# Patient Record
Sex: Female | Born: 1960
Health system: Southern US, Community
[De-identification: ages and names within clinical notes are randomized; demographics above are authoritative.]

## PROBLEM LIST (undated history)

## (undated) DIAGNOSIS — I7090 Unspecified atherosclerosis: Secondary | ICD-10-CM

## (undated) DIAGNOSIS — I10 Essential (primary) hypertension: Secondary | ICD-10-CM

## (undated) DIAGNOSIS — G4733 Obstructive sleep apnea (adult) (pediatric): Secondary | ICD-10-CM

## (undated) DIAGNOSIS — R5383 Other fatigue: Secondary | ICD-10-CM

## (undated) DIAGNOSIS — E7841 Elevated Lipoprotein(a): Secondary | ICD-10-CM

## (undated) DIAGNOSIS — I2510 Atherosclerotic heart disease of native coronary artery without angina pectoris: Secondary | ICD-10-CM

## (undated) DIAGNOSIS — R011 Cardiac murmur, unspecified: Secondary | ICD-10-CM

## (undated) DIAGNOSIS — I340 Nonrheumatic mitral (valve) insufficiency: Secondary | ICD-10-CM

## (undated) DIAGNOSIS — F319 Bipolar disorder, unspecified: Secondary | ICD-10-CM

## (undated) DIAGNOSIS — R002 Palpitations: Secondary | ICD-10-CM

## (undated) DIAGNOSIS — E785 Hyperlipidemia, unspecified: Secondary | ICD-10-CM

## (undated) DIAGNOSIS — R079 Chest pain, unspecified: Secondary | ICD-10-CM

## (undated) DIAGNOSIS — J449 Chronic obstructive pulmonary disease, unspecified: Secondary | ICD-10-CM

## (undated) DIAGNOSIS — R609 Edema, unspecified: Secondary | ICD-10-CM

## (undated) HISTORY — DX: Nonrheumatic mitral (valve) insufficiency: I34.0

## (undated) HISTORY — DX: Other fatigue: R53.83

## (undated) HISTORY — DX: Atherosclerotic heart disease of native coronary artery without angina pectoris: I25.10

## (undated) HISTORY — DX: Obstructive sleep apnea (adult) (pediatric): G47.33

## (undated) HISTORY — DX: Edema, unspecified: R60.9

## (undated) HISTORY — DX: Chest pain, unspecified: R07.9

## (undated) HISTORY — DX: Palpitations: R00.2

## (undated) HISTORY — DX: Unspecified atherosclerosis: I70.90

## (undated) HISTORY — DX: Bipolar disorder, unspecified: F31.9

## (undated) HISTORY — DX: Hyperlipidemia, unspecified: E78.5

## (undated) HISTORY — DX: Elevated lipoprotein(a): E78.41

## (undated) HISTORY — PX: TONSILLECTOMY: SUR1361

## (undated) HISTORY — DX: Chronic obstructive pulmonary disease, unspecified: J44.9

## (undated) HISTORY — DX: Cardiac murmur, unspecified: R01.1

## (undated) HISTORY — PX: TUBAL LIGATION: SHX77

## (undated) HISTORY — DX: Essential (primary) hypertension: I10

## (undated) HISTORY — PX: CHOLECYSTECTOMY: SHX55

---

## 2014-02-02 DIAGNOSIS — R0789 Other chest pain: Secondary | ICD-10-CM | POA: Diagnosis not present

## 2014-02-02 DIAGNOSIS — S299XXA Unspecified injury of thorax, initial encounter: Secondary | ICD-10-CM | POA: Diagnosis not present

## 2014-02-02 DIAGNOSIS — R0781 Pleurodynia: Secondary | ICD-10-CM | POA: Diagnosis not present

## 2014-02-02 DIAGNOSIS — J449 Chronic obstructive pulmonary disease, unspecified: Secondary | ICD-10-CM | POA: Diagnosis not present

## 2014-02-02 DIAGNOSIS — W010XXA Fall on same level from slipping, tripping and stumbling without subsequent striking against object, initial encounter: Secondary | ICD-10-CM | POA: Diagnosis not present

## 2014-02-02 DIAGNOSIS — F17210 Nicotine dependence, cigarettes, uncomplicated: Secondary | ICD-10-CM | POA: Diagnosis not present

## 2014-02-06 DIAGNOSIS — F329 Major depressive disorder, single episode, unspecified: Secondary | ICD-10-CM | POA: Diagnosis not present

## 2014-02-06 DIAGNOSIS — E785 Hyperlipidemia, unspecified: Secondary | ICD-10-CM | POA: Diagnosis not present

## 2014-02-06 DIAGNOSIS — J449 Chronic obstructive pulmonary disease, unspecified: Secondary | ICD-10-CM | POA: Diagnosis not present

## 2014-02-06 DIAGNOSIS — Z720 Tobacco use: Secondary | ICD-10-CM | POA: Diagnosis not present

## 2014-06-08 DIAGNOSIS — E875 Hyperkalemia: Secondary | ICD-10-CM | POA: Diagnosis not present

## 2014-06-08 DIAGNOSIS — E785 Hyperlipidemia, unspecified: Secondary | ICD-10-CM | POA: Diagnosis not present

## 2014-06-20 DIAGNOSIS — E785 Hyperlipidemia, unspecified: Secondary | ICD-10-CM | POA: Diagnosis not present

## 2014-06-20 DIAGNOSIS — F329 Major depressive disorder, single episode, unspecified: Secondary | ICD-10-CM | POA: Diagnosis not present

## 2014-06-20 DIAGNOSIS — R509 Fever, unspecified: Secondary | ICD-10-CM | POA: Diagnosis not present

## 2014-06-20 DIAGNOSIS — R05 Cough: Secondary | ICD-10-CM | POA: Diagnosis not present

## 2014-08-12 DIAGNOSIS — M79601 Pain in right arm: Secondary | ICD-10-CM | POA: Diagnosis not present

## 2014-08-17 DIAGNOSIS — J449 Chronic obstructive pulmonary disease, unspecified: Secondary | ICD-10-CM | POA: Diagnosis not present

## 2014-08-17 DIAGNOSIS — L03032 Cellulitis of left toe: Secondary | ICD-10-CM | POA: Diagnosis not present

## 2014-08-17 DIAGNOSIS — M79675 Pain in left toe(s): Secondary | ICD-10-CM | POA: Diagnosis not present

## 2014-08-17 DIAGNOSIS — S99922A Unspecified injury of left foot, initial encounter: Secondary | ICD-10-CM | POA: Diagnosis not present

## 2014-08-17 DIAGNOSIS — Z79899 Other long term (current) drug therapy: Secondary | ICD-10-CM | POA: Diagnosis not present

## 2014-08-17 DIAGNOSIS — L089 Local infection of the skin and subcutaneous tissue, unspecified: Secondary | ICD-10-CM | POA: Diagnosis not present

## 2014-08-17 DIAGNOSIS — S91202A Unspecified open wound of left great toe with damage to nail, initial encounter: Secondary | ICD-10-CM | POA: Diagnosis not present

## 2014-08-17 DIAGNOSIS — F17210 Nicotine dependence, cigarettes, uncomplicated: Secondary | ICD-10-CM | POA: Diagnosis not present

## 2014-08-26 DIAGNOSIS — K1379 Other lesions of oral mucosa: Secondary | ICD-10-CM | POA: Diagnosis not present

## 2014-08-26 DIAGNOSIS — K088 Other specified disorders of teeth and supporting structures: Secondary | ICD-10-CM | POA: Diagnosis not present

## 2014-10-16 DIAGNOSIS — R05 Cough: Secondary | ICD-10-CM | POA: Diagnosis not present

## 2014-10-16 DIAGNOSIS — R079 Chest pain, unspecified: Secondary | ICD-10-CM | POA: Diagnosis not present

## 2014-10-16 DIAGNOSIS — J189 Pneumonia, unspecified organism: Secondary | ICD-10-CM | POA: Diagnosis not present

## 2014-10-16 DIAGNOSIS — J449 Chronic obstructive pulmonary disease, unspecified: Secondary | ICD-10-CM | POA: Diagnosis not present

## 2014-10-16 DIAGNOSIS — Z79899 Other long term (current) drug therapy: Secondary | ICD-10-CM | POA: Diagnosis not present

## 2014-10-16 DIAGNOSIS — F17210 Nicotine dependence, cigarettes, uncomplicated: Secondary | ICD-10-CM | POA: Diagnosis not present

## 2014-10-16 DIAGNOSIS — R0602 Shortness of breath: Secondary | ICD-10-CM | POA: Diagnosis not present

## 2014-10-17 DIAGNOSIS — R634 Abnormal weight loss: Secondary | ICD-10-CM | POA: Diagnosis not present

## 2014-10-17 DIAGNOSIS — J189 Pneumonia, unspecified organism: Secondary | ICD-10-CM | POA: Diagnosis not present

## 2014-10-17 DIAGNOSIS — G4700 Insomnia, unspecified: Secondary | ICD-10-CM | POA: Diagnosis not present

## 2014-10-17 DIAGNOSIS — J449 Chronic obstructive pulmonary disease, unspecified: Secondary | ICD-10-CM | POA: Diagnosis not present

## 2014-11-21 DIAGNOSIS — Z0389 Encounter for observation for other suspected diseases and conditions ruled out: Secondary | ICD-10-CM | POA: Diagnosis not present

## 2014-11-21 DIAGNOSIS — J189 Pneumonia, unspecified organism: Secondary | ICD-10-CM | POA: Diagnosis not present

## 2015-01-17 DIAGNOSIS — E875 Hyperkalemia: Secondary | ICD-10-CM | POA: Diagnosis not present

## 2015-01-17 DIAGNOSIS — E785 Hyperlipidemia, unspecified: Secondary | ICD-10-CM | POA: Diagnosis not present

## 2015-01-29 DIAGNOSIS — Z720 Tobacco use: Secondary | ICD-10-CM | POA: Diagnosis not present

## 2015-01-29 DIAGNOSIS — E785 Hyperlipidemia, unspecified: Secondary | ICD-10-CM | POA: Diagnosis not present

## 2015-01-29 DIAGNOSIS — Z1211 Encounter for screening for malignant neoplasm of colon: Secondary | ICD-10-CM | POA: Diagnosis not present

## 2015-01-29 DIAGNOSIS — G4700 Insomnia, unspecified: Secondary | ICD-10-CM | POA: Diagnosis not present

## 2015-02-19 DIAGNOSIS — J449 Chronic obstructive pulmonary disease, unspecified: Secondary | ICD-10-CM | POA: Diagnosis not present

## 2015-02-22 DIAGNOSIS — J449 Chronic obstructive pulmonary disease, unspecified: Secondary | ICD-10-CM | POA: Diagnosis not present

## 2015-03-20 DIAGNOSIS — J209 Acute bronchitis, unspecified: Secondary | ICD-10-CM | POA: Diagnosis not present

## 2015-03-20 DIAGNOSIS — J0100 Acute maxillary sinusitis, unspecified: Secondary | ICD-10-CM | POA: Diagnosis not present

## 2015-03-25 DIAGNOSIS — J449 Chronic obstructive pulmonary disease, unspecified: Secondary | ICD-10-CM | POA: Diagnosis not present

## 2015-04-19 DIAGNOSIS — L259 Unspecified contact dermatitis, unspecified cause: Secondary | ICD-10-CM | POA: Diagnosis not present

## 2015-04-22 DIAGNOSIS — J449 Chronic obstructive pulmonary disease, unspecified: Secondary | ICD-10-CM | POA: Diagnosis not present

## 2015-04-29 DIAGNOSIS — Z205 Contact with and (suspected) exposure to viral hepatitis: Secondary | ICD-10-CM | POA: Diagnosis not present

## 2015-04-29 DIAGNOSIS — R05 Cough: Secondary | ICD-10-CM | POA: Diagnosis not present

## 2015-04-29 DIAGNOSIS — J302 Other seasonal allergic rhinitis: Secondary | ICD-10-CM | POA: Diagnosis not present

## 2015-04-29 DIAGNOSIS — B86 Scabies: Secondary | ICD-10-CM | POA: Diagnosis not present

## 2015-05-01 DIAGNOSIS — J40 Bronchitis, not specified as acute or chronic: Secondary | ICD-10-CM | POA: Diagnosis not present

## 2015-05-01 DIAGNOSIS — R05 Cough: Secondary | ICD-10-CM | POA: Diagnosis not present

## 2015-05-01 DIAGNOSIS — R938 Abnormal findings on diagnostic imaging of other specified body structures: Secondary | ICD-10-CM | POA: Diagnosis not present

## 2015-05-09 DIAGNOSIS — H905 Unspecified sensorineural hearing loss: Secondary | ICD-10-CM | POA: Diagnosis not present

## 2015-05-21 DIAGNOSIS — E785 Hyperlipidemia, unspecified: Secondary | ICD-10-CM | POA: Diagnosis not present

## 2015-05-23 DIAGNOSIS — J449 Chronic obstructive pulmonary disease, unspecified: Secondary | ICD-10-CM | POA: Diagnosis not present

## 2015-05-28 DIAGNOSIS — Z720 Tobacco use: Secondary | ICD-10-CM | POA: Diagnosis not present

## 2015-05-28 DIAGNOSIS — J449 Chronic obstructive pulmonary disease, unspecified: Secondary | ICD-10-CM | POA: Diagnosis not present

## 2015-05-28 DIAGNOSIS — Z139 Encounter for screening, unspecified: Secondary | ICD-10-CM | POA: Diagnosis not present

## 2015-05-28 DIAGNOSIS — Z1389 Encounter for screening for other disorder: Secondary | ICD-10-CM | POA: Diagnosis not present

## 2015-05-28 DIAGNOSIS — Z0000 Encounter for general adult medical examination without abnormal findings: Secondary | ICD-10-CM | POA: Diagnosis not present

## 2015-05-28 DIAGNOSIS — G4700 Insomnia, unspecified: Secondary | ICD-10-CM | POA: Diagnosis not present

## 2015-05-28 DIAGNOSIS — E785 Hyperlipidemia, unspecified: Secondary | ICD-10-CM | POA: Diagnosis not present

## 2015-06-06 DIAGNOSIS — J449 Chronic obstructive pulmonary disease, unspecified: Secondary | ICD-10-CM | POA: Diagnosis not present

## 2015-06-06 DIAGNOSIS — R197 Diarrhea, unspecified: Secondary | ICD-10-CM | POA: Diagnosis not present

## 2015-06-06 DIAGNOSIS — R112 Nausea with vomiting, unspecified: Secondary | ICD-10-CM | POA: Diagnosis not present

## 2015-06-06 DIAGNOSIS — G4700 Insomnia, unspecified: Secondary | ICD-10-CM | POA: Diagnosis not present

## 2015-06-22 DIAGNOSIS — J449 Chronic obstructive pulmonary disease, unspecified: Secondary | ICD-10-CM | POA: Diagnosis not present

## 2015-07-04 DIAGNOSIS — Z0000 Encounter for general adult medical examination without abnormal findings: Secondary | ICD-10-CM | POA: Diagnosis not present

## 2015-07-04 DIAGNOSIS — Z01419 Encounter for gynecological examination (general) (routine) without abnormal findings: Secondary | ICD-10-CM | POA: Diagnosis not present

## 2015-07-04 DIAGNOSIS — Z1211 Encounter for screening for malignant neoplasm of colon: Secondary | ICD-10-CM | POA: Diagnosis not present

## 2015-07-04 DIAGNOSIS — Z1231 Encounter for screening mammogram for malignant neoplasm of breast: Secondary | ICD-10-CM | POA: Diagnosis not present

## 2015-07-09 DIAGNOSIS — Z1231 Encounter for screening mammogram for malignant neoplasm of breast: Secondary | ICD-10-CM | POA: Diagnosis not present

## 2015-07-23 DIAGNOSIS — J449 Chronic obstructive pulmonary disease, unspecified: Secondary | ICD-10-CM | POA: Diagnosis not present

## 2015-08-22 DIAGNOSIS — J449 Chronic obstructive pulmonary disease, unspecified: Secondary | ICD-10-CM | POA: Diagnosis not present

## 2015-09-13 DIAGNOSIS — F17200 Nicotine dependence, unspecified, uncomplicated: Secondary | ICD-10-CM | POA: Diagnosis not present

## 2015-09-13 DIAGNOSIS — Z1211 Encounter for screening for malignant neoplasm of colon: Secondary | ICD-10-CM | POA: Diagnosis not present

## 2015-09-22 DIAGNOSIS — J449 Chronic obstructive pulmonary disease, unspecified: Secondary | ICD-10-CM | POA: Diagnosis not present

## 2015-10-23 DIAGNOSIS — J449 Chronic obstructive pulmonary disease, unspecified: Secondary | ICD-10-CM | POA: Diagnosis not present

## 2015-11-22 DIAGNOSIS — J449 Chronic obstructive pulmonary disease, unspecified: Secondary | ICD-10-CM | POA: Diagnosis not present

## 2015-12-18 DIAGNOSIS — E785 Hyperlipidemia, unspecified: Secondary | ICD-10-CM | POA: Diagnosis not present

## 2015-12-18 DIAGNOSIS — R238 Other skin changes: Secondary | ICD-10-CM | POA: Diagnosis not present

## 2015-12-23 DIAGNOSIS — J449 Chronic obstructive pulmonary disease, unspecified: Secondary | ICD-10-CM | POA: Diagnosis not present

## 2016-01-22 DIAGNOSIS — J449 Chronic obstructive pulmonary disease, unspecified: Secondary | ICD-10-CM | POA: Diagnosis not present

## 2016-02-22 DIAGNOSIS — J449 Chronic obstructive pulmonary disease, unspecified: Secondary | ICD-10-CM | POA: Diagnosis not present

## 2016-03-05 DIAGNOSIS — J449 Chronic obstructive pulmonary disease, unspecified: Secondary | ICD-10-CM | POA: Diagnosis not present

## 2016-03-24 DIAGNOSIS — J449 Chronic obstructive pulmonary disease, unspecified: Secondary | ICD-10-CM | POA: Diagnosis not present

## 2016-03-25 DIAGNOSIS — J449 Chronic obstructive pulmonary disease, unspecified: Secondary | ICD-10-CM | POA: Diagnosis not present

## 2016-04-02 DIAGNOSIS — J449 Chronic obstructive pulmonary disease, unspecified: Secondary | ICD-10-CM | POA: Diagnosis not present

## 2016-04-21 DIAGNOSIS — J449 Chronic obstructive pulmonary disease, unspecified: Secondary | ICD-10-CM | POA: Diagnosis not present

## 2016-04-28 DIAGNOSIS — G4700 Insomnia, unspecified: Secondary | ICD-10-CM | POA: Diagnosis not present

## 2016-04-28 DIAGNOSIS — Z205 Contact with and (suspected) exposure to viral hepatitis: Secondary | ICD-10-CM | POA: Diagnosis not present

## 2016-04-28 DIAGNOSIS — E785 Hyperlipidemia, unspecified: Secondary | ICD-10-CM | POA: Diagnosis not present

## 2016-04-28 DIAGNOSIS — J449 Chronic obstructive pulmonary disease, unspecified: Secondary | ICD-10-CM | POA: Diagnosis not present

## 2016-05-04 DIAGNOSIS — E871 Hypo-osmolality and hyponatremia: Secondary | ICD-10-CM | POA: Diagnosis not present

## 2016-05-12 DIAGNOSIS — L089 Local infection of the skin and subcutaneous tissue, unspecified: Secondary | ICD-10-CM | POA: Diagnosis not present

## 2016-05-12 DIAGNOSIS — F17200 Nicotine dependence, unspecified, uncomplicated: Secondary | ICD-10-CM | POA: Diagnosis not present

## 2016-05-12 DIAGNOSIS — G4700 Insomnia, unspecified: Secondary | ICD-10-CM | POA: Diagnosis not present

## 2016-05-12 DIAGNOSIS — S50312A Abrasion of left elbow, initial encounter: Secondary | ICD-10-CM | POA: Diagnosis not present

## 2016-05-22 DIAGNOSIS — J449 Chronic obstructive pulmonary disease, unspecified: Secondary | ICD-10-CM | POA: Diagnosis not present

## 2016-06-03 DIAGNOSIS — Z139 Encounter for screening, unspecified: Secondary | ICD-10-CM | POA: Diagnosis not present

## 2016-06-03 DIAGNOSIS — Z0000 Encounter for general adult medical examination without abnormal findings: Secondary | ICD-10-CM | POA: Diagnosis not present

## 2016-06-21 DIAGNOSIS — J449 Chronic obstructive pulmonary disease, unspecified: Secondary | ICD-10-CM | POA: Diagnosis not present

## 2016-07-22 DIAGNOSIS — J449 Chronic obstructive pulmonary disease, unspecified: Secondary | ICD-10-CM | POA: Diagnosis not present

## 2016-08-21 DIAGNOSIS — J449 Chronic obstructive pulmonary disease, unspecified: Secondary | ICD-10-CM | POA: Diagnosis not present

## 2016-08-31 DIAGNOSIS — Z6821 Body mass index (BMI) 21.0-21.9, adult: Secondary | ICD-10-CM | POA: Diagnosis not present

## 2016-08-31 DIAGNOSIS — L089 Local infection of the skin and subcutaneous tissue, unspecified: Secondary | ICD-10-CM | POA: Diagnosis not present

## 2016-09-21 DIAGNOSIS — J449 Chronic obstructive pulmonary disease, unspecified: Secondary | ICD-10-CM | POA: Diagnosis not present

## 2016-10-22 DIAGNOSIS — J449 Chronic obstructive pulmonary disease, unspecified: Secondary | ICD-10-CM | POA: Diagnosis not present

## 2016-11-16 DIAGNOSIS — R062 Wheezing: Secondary | ICD-10-CM | POA: Diagnosis not present

## 2016-11-16 DIAGNOSIS — K591 Functional diarrhea: Secondary | ICD-10-CM | POA: Diagnosis not present

## 2016-11-16 DIAGNOSIS — J209 Acute bronchitis, unspecified: Secondary | ICD-10-CM | POA: Diagnosis not present

## 2016-11-16 DIAGNOSIS — J449 Chronic obstructive pulmonary disease, unspecified: Secondary | ICD-10-CM | POA: Diagnosis not present

## 2016-11-16 DIAGNOSIS — R112 Nausea with vomiting, unspecified: Secondary | ICD-10-CM | POA: Diagnosis not present

## 2016-11-21 DIAGNOSIS — J449 Chronic obstructive pulmonary disease, unspecified: Secondary | ICD-10-CM | POA: Diagnosis not present

## 2016-12-01 DIAGNOSIS — Z6821 Body mass index (BMI) 21.0-21.9, adult: Secondary | ICD-10-CM | POA: Diagnosis not present

## 2016-12-01 DIAGNOSIS — Z0000 Encounter for general adult medical examination without abnormal findings: Secondary | ICD-10-CM | POA: Diagnosis not present

## 2016-12-01 DIAGNOSIS — Z13228 Encounter for screening for other metabolic disorders: Secondary | ICD-10-CM | POA: Diagnosis not present

## 2016-12-01 DIAGNOSIS — F331 Major depressive disorder, recurrent, moderate: Secondary | ICD-10-CM | POA: Diagnosis not present

## 2016-12-01 DIAGNOSIS — Z139 Encounter for screening, unspecified: Secondary | ICD-10-CM | POA: Diagnosis not present

## 2016-12-01 DIAGNOSIS — Z1331 Encounter for screening for depression: Secondary | ICD-10-CM | POA: Diagnosis not present

## 2016-12-01 DIAGNOSIS — Z862 Personal history of diseases of the blood and blood-forming organs and certain disorders involving the immune mechanism: Secondary | ICD-10-CM | POA: Diagnosis not present

## 2016-12-01 DIAGNOSIS — G4700 Insomnia, unspecified: Secondary | ICD-10-CM | POA: Diagnosis not present

## 2016-12-01 DIAGNOSIS — R5383 Other fatigue: Secondary | ICD-10-CM | POA: Diagnosis not present

## 2016-12-01 DIAGNOSIS — Z8349 Family history of other endocrine, nutritional and metabolic diseases: Secondary | ICD-10-CM | POA: Diagnosis not present

## 2016-12-03 DIAGNOSIS — Z6821 Body mass index (BMI) 21.0-21.9, adult: Secondary | ICD-10-CM | POA: Diagnosis not present

## 2016-12-03 DIAGNOSIS — G8929 Other chronic pain: Secondary | ICD-10-CM | POA: Diagnosis not present

## 2016-12-03 DIAGNOSIS — J449 Chronic obstructive pulmonary disease, unspecified: Secondary | ICD-10-CM | POA: Diagnosis not present

## 2016-12-03 DIAGNOSIS — M545 Low back pain: Secondary | ICD-10-CM | POA: Diagnosis not present

## 2016-12-10 DIAGNOSIS — G4700 Insomnia, unspecified: Secondary | ICD-10-CM | POA: Diagnosis not present

## 2016-12-10 DIAGNOSIS — J449 Chronic obstructive pulmonary disease, unspecified: Secondary | ICD-10-CM | POA: Diagnosis not present

## 2016-12-10 DIAGNOSIS — Z6821 Body mass index (BMI) 21.0-21.9, adult: Secondary | ICD-10-CM | POA: Diagnosis not present

## 2016-12-22 DIAGNOSIS — S2231XA Fracture of one rib, right side, initial encounter for closed fracture: Secondary | ICD-10-CM | POA: Diagnosis not present

## 2016-12-22 DIAGNOSIS — S2239XA Fracture of one rib, unspecified side, initial encounter for closed fracture: Secondary | ICD-10-CM | POA: Diagnosis not present

## 2016-12-22 DIAGNOSIS — J449 Chronic obstructive pulmonary disease, unspecified: Secondary | ICD-10-CM | POA: Diagnosis not present

## 2016-12-26 DIAGNOSIS — S2241XA Multiple fractures of ribs, right side, initial encounter for closed fracture: Secondary | ICD-10-CM | POA: Diagnosis not present

## 2016-12-26 DIAGNOSIS — Z79899 Other long term (current) drug therapy: Secondary | ICD-10-CM | POA: Diagnosis not present

## 2016-12-26 DIAGNOSIS — F17210 Nicotine dependence, cigarettes, uncomplicated: Secondary | ICD-10-CM | POA: Diagnosis not present

## 2016-12-26 DIAGNOSIS — S2241XD Multiple fractures of ribs, right side, subsequent encounter for fracture with routine healing: Secondary | ICD-10-CM | POA: Diagnosis not present

## 2016-12-26 DIAGNOSIS — J449 Chronic obstructive pulmonary disease, unspecified: Secondary | ICD-10-CM | POA: Diagnosis not present

## 2016-12-26 DIAGNOSIS — Z79891 Long term (current) use of opiate analgesic: Secondary | ICD-10-CM | POA: Diagnosis not present

## 2016-12-29 DIAGNOSIS — Z6821 Body mass index (BMI) 21.0-21.9, adult: Secondary | ICD-10-CM | POA: Diagnosis not present

## 2016-12-29 DIAGNOSIS — S2231XA Fracture of one rib, right side, initial encounter for closed fracture: Secondary | ICD-10-CM | POA: Diagnosis not present

## 2017-01-01 DIAGNOSIS — J301 Allergic rhinitis due to pollen: Secondary | ICD-10-CM | POA: Diagnosis not present

## 2017-01-04 DIAGNOSIS — J301 Allergic rhinitis due to pollen: Secondary | ICD-10-CM | POA: Diagnosis not present

## 2017-01-05 DIAGNOSIS — J301 Allergic rhinitis due to pollen: Secondary | ICD-10-CM | POA: Diagnosis not present

## 2017-01-06 DIAGNOSIS — J301 Allergic rhinitis due to pollen: Secondary | ICD-10-CM | POA: Diagnosis not present

## 2017-01-07 DIAGNOSIS — J301 Allergic rhinitis due to pollen: Secondary | ICD-10-CM | POA: Diagnosis not present

## 2017-01-21 DIAGNOSIS — J449 Chronic obstructive pulmonary disease, unspecified: Secondary | ICD-10-CM | POA: Diagnosis not present

## 2017-02-08 DIAGNOSIS — Z6823 Body mass index (BMI) 23.0-23.9, adult: Secondary | ICD-10-CM | POA: Diagnosis not present

## 2017-02-08 DIAGNOSIS — F17200 Nicotine dependence, unspecified, uncomplicated: Secondary | ICD-10-CM | POA: Diagnosis not present

## 2017-02-08 DIAGNOSIS — J449 Chronic obstructive pulmonary disease, unspecified: Secondary | ICD-10-CM | POA: Diagnosis not present

## 2017-02-19 DIAGNOSIS — J449 Chronic obstructive pulmonary disease, unspecified: Secondary | ICD-10-CM | POA: Diagnosis not present

## 2017-02-19 DIAGNOSIS — G4733 Obstructive sleep apnea (adult) (pediatric): Secondary | ICD-10-CM | POA: Diagnosis not present

## 2017-02-21 DIAGNOSIS — J449 Chronic obstructive pulmonary disease, unspecified: Secondary | ICD-10-CM | POA: Diagnosis not present

## 2017-03-11 DIAGNOSIS — Z7189 Other specified counseling: Secondary | ICD-10-CM | POA: Diagnosis not present

## 2017-03-11 DIAGNOSIS — Z6824 Body mass index (BMI) 24.0-24.9, adult: Secondary | ICD-10-CM | POA: Diagnosis not present

## 2017-03-11 DIAGNOSIS — Z716 Tobacco abuse counseling: Secondary | ICD-10-CM | POA: Diagnosis not present

## 2017-03-11 DIAGNOSIS — F17200 Nicotine dependence, unspecified, uncomplicated: Secondary | ICD-10-CM | POA: Diagnosis not present

## 2017-03-18 DIAGNOSIS — G4733 Obstructive sleep apnea (adult) (pediatric): Secondary | ICD-10-CM | POA: Diagnosis not present

## 2017-03-18 DIAGNOSIS — J449 Chronic obstructive pulmonary disease, unspecified: Secondary | ICD-10-CM | POA: Diagnosis not present

## 2017-03-22 DIAGNOSIS — G4733 Obstructive sleep apnea (adult) (pediatric): Secondary | ICD-10-CM | POA: Diagnosis not present

## 2017-03-24 DIAGNOSIS — J449 Chronic obstructive pulmonary disease, unspecified: Secondary | ICD-10-CM | POA: Diagnosis not present

## 2017-04-13 DIAGNOSIS — B852 Pediculosis, unspecified: Secondary | ICD-10-CM | POA: Diagnosis not present

## 2017-04-13 DIAGNOSIS — Z6823 Body mass index (BMI) 23.0-23.9, adult: Secondary | ICD-10-CM | POA: Diagnosis not present

## 2017-04-13 DIAGNOSIS — R6883 Chills (without fever): Secondary | ICD-10-CM | POA: Diagnosis not present

## 2017-04-14 DIAGNOSIS — J449 Chronic obstructive pulmonary disease, unspecified: Secondary | ICD-10-CM | POA: Diagnosis not present

## 2017-04-14 DIAGNOSIS — G4733 Obstructive sleep apnea (adult) (pediatric): Secondary | ICD-10-CM | POA: Diagnosis not present

## 2017-04-19 DIAGNOSIS — G4733 Obstructive sleep apnea (adult) (pediatric): Secondary | ICD-10-CM | POA: Diagnosis not present

## 2017-04-21 DIAGNOSIS — J449 Chronic obstructive pulmonary disease, unspecified: Secondary | ICD-10-CM | POA: Diagnosis not present

## 2017-04-29 DIAGNOSIS — J449 Chronic obstructive pulmonary disease, unspecified: Secondary | ICD-10-CM | POA: Diagnosis not present

## 2017-04-29 DIAGNOSIS — Z6823 Body mass index (BMI) 23.0-23.9, adult: Secondary | ICD-10-CM | POA: Diagnosis not present

## 2017-04-29 DIAGNOSIS — M25522 Pain in left elbow: Secondary | ICD-10-CM | POA: Diagnosis not present

## 2017-04-30 DIAGNOSIS — M79602 Pain in left arm: Secondary | ICD-10-CM | POA: Diagnosis not present

## 2017-04-30 DIAGNOSIS — S51802A Unspecified open wound of left forearm, initial encounter: Secondary | ICD-10-CM | POA: Diagnosis not present

## 2017-05-10 DIAGNOSIS — J449 Chronic obstructive pulmonary disease, unspecified: Secondary | ICD-10-CM | POA: Diagnosis not present

## 2017-05-10 DIAGNOSIS — G4733 Obstructive sleep apnea (adult) (pediatric): Secondary | ICD-10-CM | POA: Diagnosis not present

## 2017-05-13 DIAGNOSIS — M25522 Pain in left elbow: Secondary | ICD-10-CM | POA: Diagnosis not present

## 2017-05-13 DIAGNOSIS — Z6822 Body mass index (BMI) 22.0-22.9, adult: Secondary | ICD-10-CM | POA: Diagnosis not present

## 2017-05-18 DIAGNOSIS — M7542 Impingement syndrome of left shoulder: Secondary | ICD-10-CM | POA: Diagnosis not present

## 2017-05-18 DIAGNOSIS — M7710 Lateral epicondylitis, unspecified elbow: Secondary | ICD-10-CM | POA: Diagnosis not present

## 2017-05-20 DIAGNOSIS — G4733 Obstructive sleep apnea (adult) (pediatric): Secondary | ICD-10-CM | POA: Diagnosis not present

## 2017-05-21 DIAGNOSIS — E785 Hyperlipidemia, unspecified: Secondary | ICD-10-CM | POA: Diagnosis not present

## 2017-05-21 DIAGNOSIS — R5383 Other fatigue: Secondary | ICD-10-CM | POA: Diagnosis not present

## 2017-05-22 DIAGNOSIS — J449 Chronic obstructive pulmonary disease, unspecified: Secondary | ICD-10-CM | POA: Diagnosis not present

## 2017-05-27 DIAGNOSIS — J449 Chronic obstructive pulmonary disease, unspecified: Secondary | ICD-10-CM | POA: Diagnosis not present

## 2017-05-27 DIAGNOSIS — G4700 Insomnia, unspecified: Secondary | ICD-10-CM | POA: Diagnosis not present

## 2017-05-27 DIAGNOSIS — F319 Bipolar disorder, unspecified: Secondary | ICD-10-CM | POA: Diagnosis not present

## 2017-05-27 DIAGNOSIS — E785 Hyperlipidemia, unspecified: Secondary | ICD-10-CM | POA: Diagnosis not present

## 2017-06-19 DIAGNOSIS — G4733 Obstructive sleep apnea (adult) (pediatric): Secondary | ICD-10-CM | POA: Diagnosis not present

## 2017-06-21 DIAGNOSIS — J449 Chronic obstructive pulmonary disease, unspecified: Secondary | ICD-10-CM | POA: Diagnosis not present

## 2017-11-11 DIAGNOSIS — L819 Disorder of pigmentation, unspecified: Secondary | ICD-10-CM | POA: Diagnosis not present

## 2017-11-11 DIAGNOSIS — Z01419 Encounter for gynecological examination (general) (routine) without abnormal findings: Secondary | ICD-10-CM | POA: Diagnosis not present

## 2017-11-11 DIAGNOSIS — Z1212 Encounter for screening for malignant neoplasm of rectum: Secondary | ICD-10-CM | POA: Diagnosis not present

## 2017-11-11 DIAGNOSIS — Z0000 Encounter for general adult medical examination without abnormal findings: Secondary | ICD-10-CM | POA: Diagnosis not present

## 2017-11-11 DIAGNOSIS — F17200 Nicotine dependence, unspecified, uncomplicated: Secondary | ICD-10-CM | POA: Diagnosis not present

## 2017-11-11 DIAGNOSIS — Z1211 Encounter for screening for malignant neoplasm of colon: Secondary | ICD-10-CM | POA: Diagnosis not present

## 2017-11-19 DIAGNOSIS — G4733 Obstructive sleep apnea (adult) (pediatric): Secondary | ICD-10-CM | POA: Diagnosis not present

## 2017-11-21 DIAGNOSIS — J449 Chronic obstructive pulmonary disease, unspecified: Secondary | ICD-10-CM | POA: Diagnosis not present

## 2017-12-06 DIAGNOSIS — E785 Hyperlipidemia, unspecified: Secondary | ICD-10-CM | POA: Diagnosis not present

## 2017-12-13 DIAGNOSIS — J449 Chronic obstructive pulmonary disease, unspecified: Secondary | ICD-10-CM | POA: Diagnosis not present

## 2017-12-13 DIAGNOSIS — R234 Changes in skin texture: Secondary | ICD-10-CM | POA: Diagnosis not present

## 2017-12-13 DIAGNOSIS — E785 Hyperlipidemia, unspecified: Secondary | ICD-10-CM | POA: Diagnosis not present

## 2017-12-13 DIAGNOSIS — Z139 Encounter for screening, unspecified: Secondary | ICD-10-CM | POA: Diagnosis not present

## 2017-12-13 DIAGNOSIS — Z0000 Encounter for general adult medical examination without abnormal findings: Secondary | ICD-10-CM | POA: Diagnosis not present

## 2017-12-13 DIAGNOSIS — F319 Bipolar disorder, unspecified: Secondary | ICD-10-CM | POA: Diagnosis not present

## 2017-12-13 DIAGNOSIS — F17200 Nicotine dependence, unspecified, uncomplicated: Secondary | ICD-10-CM | POA: Diagnosis not present

## 2017-12-13 DIAGNOSIS — Z9181 History of falling: Secondary | ICD-10-CM | POA: Diagnosis not present

## 2017-12-14 DIAGNOSIS — L821 Other seborrheic keratosis: Secondary | ICD-10-CM | POA: Diagnosis not present

## 2017-12-14 DIAGNOSIS — L570 Actinic keratosis: Secondary | ICD-10-CM | POA: Diagnosis not present

## 2017-12-14 DIAGNOSIS — L578 Other skin changes due to chronic exposure to nonionizing radiation: Secondary | ICD-10-CM | POA: Diagnosis not present

## 2017-12-20 DIAGNOSIS — G4733 Obstructive sleep apnea (adult) (pediatric): Secondary | ICD-10-CM | POA: Diagnosis not present

## 2017-12-22 DIAGNOSIS — R928 Other abnormal and inconclusive findings on diagnostic imaging of breast: Secondary | ICD-10-CM | POA: Diagnosis not present

## 2017-12-22 DIAGNOSIS — N6489 Other specified disorders of breast: Secondary | ICD-10-CM | POA: Diagnosis not present

## 2017-12-22 DIAGNOSIS — Z9889 Other specified postprocedural states: Secondary | ICD-10-CM | POA: Diagnosis not present

## 2017-12-22 DIAGNOSIS — J449 Chronic obstructive pulmonary disease, unspecified: Secondary | ICD-10-CM | POA: Diagnosis not present

## 2017-12-22 DIAGNOSIS — R234 Changes in skin texture: Secondary | ICD-10-CM | POA: Diagnosis not present

## 2017-12-22 DIAGNOSIS — N644 Mastodynia: Secondary | ICD-10-CM | POA: Diagnosis not present

## 2018-01-19 DIAGNOSIS — G4733 Obstructive sleep apnea (adult) (pediatric): Secondary | ICD-10-CM | POA: Diagnosis not present

## 2018-01-21 DIAGNOSIS — J449 Chronic obstructive pulmonary disease, unspecified: Secondary | ICD-10-CM | POA: Diagnosis not present

## 2018-02-13 DIAGNOSIS — J209 Acute bronchitis, unspecified: Secondary | ICD-10-CM | POA: Diagnosis not present

## 2018-02-19 DIAGNOSIS — G4733 Obstructive sleep apnea (adult) (pediatric): Secondary | ICD-10-CM | POA: Diagnosis not present

## 2018-02-21 DIAGNOSIS — J449 Chronic obstructive pulmonary disease, unspecified: Secondary | ICD-10-CM | POA: Diagnosis not present

## 2018-03-22 DIAGNOSIS — H1032 Unspecified acute conjunctivitis, left eye: Secondary | ICD-10-CM | POA: Diagnosis not present

## 2018-03-22 DIAGNOSIS — G4733 Obstructive sleep apnea (adult) (pediatric): Secondary | ICD-10-CM | POA: Diagnosis not present

## 2018-03-22 DIAGNOSIS — R05 Cough: Secondary | ICD-10-CM | POA: Diagnosis not present

## 2018-03-24 DIAGNOSIS — J449 Chronic obstructive pulmonary disease, unspecified: Secondary | ICD-10-CM | POA: Diagnosis not present

## 2018-04-06 DIAGNOSIS — C44329 Squamous cell carcinoma of skin of other parts of face: Secondary | ICD-10-CM | POA: Diagnosis not present

## 2018-04-06 DIAGNOSIS — L570 Actinic keratosis: Secondary | ICD-10-CM | POA: Diagnosis not present

## 2018-04-14 DIAGNOSIS — Z6823 Body mass index (BMI) 23.0-23.9, adult: Secondary | ICD-10-CM | POA: Diagnosis not present

## 2018-04-14 DIAGNOSIS — M2550 Pain in unspecified joint: Secondary | ICD-10-CM | POA: Diagnosis not present

## 2018-04-14 DIAGNOSIS — R05 Cough: Secondary | ICD-10-CM | POA: Diagnosis not present

## 2018-04-14 DIAGNOSIS — J441 Chronic obstructive pulmonary disease with (acute) exacerbation: Secondary | ICD-10-CM | POA: Diagnosis not present

## 2018-04-22 DIAGNOSIS — J449 Chronic obstructive pulmonary disease, unspecified: Secondary | ICD-10-CM | POA: Diagnosis not present

## 2018-05-23 DIAGNOSIS — J449 Chronic obstructive pulmonary disease, unspecified: Secondary | ICD-10-CM | POA: Diagnosis not present

## 2018-07-05 DIAGNOSIS — E785 Hyperlipidemia, unspecified: Secondary | ICD-10-CM | POA: Diagnosis not present

## 2018-07-12 DIAGNOSIS — J449 Chronic obstructive pulmonary disease, unspecified: Secondary | ICD-10-CM | POA: Diagnosis not present

## 2018-07-12 DIAGNOSIS — F319 Bipolar disorder, unspecified: Secondary | ICD-10-CM | POA: Diagnosis not present

## 2018-07-12 DIAGNOSIS — F17200 Nicotine dependence, unspecified, uncomplicated: Secondary | ICD-10-CM | POA: Diagnosis not present

## 2018-07-12 DIAGNOSIS — E785 Hyperlipidemia, unspecified: Secondary | ICD-10-CM | POA: Diagnosis not present

## 2018-07-20 DIAGNOSIS — Z043 Encounter for examination and observation following other accident: Secondary | ICD-10-CM | POA: Diagnosis not present

## 2018-07-20 DIAGNOSIS — S8002XA Contusion of left knee, initial encounter: Secondary | ICD-10-CM | POA: Diagnosis not present

## 2018-07-20 DIAGNOSIS — W19XXXA Unspecified fall, initial encounter: Secondary | ICD-10-CM | POA: Diagnosis not present

## 2018-07-20 DIAGNOSIS — S8001XA Contusion of right knee, initial encounter: Secondary | ICD-10-CM | POA: Diagnosis not present

## 2018-07-20 DIAGNOSIS — S50312A Abrasion of left elbow, initial encounter: Secondary | ICD-10-CM | POA: Diagnosis not present

## 2018-07-20 DIAGNOSIS — S96911A Strain of unspecified muscle and tendon at ankle and foot level, right foot, initial encounter: Secondary | ICD-10-CM | POA: Diagnosis not present

## 2018-08-02 DIAGNOSIS — Z6823 Body mass index (BMI) 23.0-23.9, adult: Secondary | ICD-10-CM | POA: Diagnosis not present

## 2018-08-02 DIAGNOSIS — T1490XD Injury, unspecified, subsequent encounter: Secondary | ICD-10-CM | POA: Diagnosis not present

## 2018-08-02 DIAGNOSIS — M25561 Pain in right knee: Secondary | ICD-10-CM | POA: Diagnosis not present

## 2018-08-02 DIAGNOSIS — M25562 Pain in left knee: Secondary | ICD-10-CM | POA: Diagnosis not present

## 2018-08-02 DIAGNOSIS — Z7689 Persons encountering health services in other specified circumstances: Secondary | ICD-10-CM | POA: Diagnosis not present

## 2018-08-02 DIAGNOSIS — M25569 Pain in unspecified knee: Secondary | ICD-10-CM | POA: Diagnosis not present

## 2018-08-03 DIAGNOSIS — R32 Unspecified urinary incontinence: Secondary | ICD-10-CM | POA: Diagnosis not present

## 2018-08-16 DIAGNOSIS — M1711 Unilateral primary osteoarthritis, right knee: Secondary | ICD-10-CM | POA: Diagnosis not present

## 2018-08-16 DIAGNOSIS — M1712 Unilateral primary osteoarthritis, left knee: Secondary | ICD-10-CM | POA: Diagnosis not present

## 2018-09-03 DIAGNOSIS — R32 Unspecified urinary incontinence: Secondary | ICD-10-CM | POA: Diagnosis not present

## 2018-10-04 DIAGNOSIS — Z6823 Body mass index (BMI) 23.0-23.9, adult: Secondary | ICD-10-CM | POA: Diagnosis not present

## 2018-10-04 DIAGNOSIS — E785 Hyperlipidemia, unspecified: Secondary | ICD-10-CM | POA: Diagnosis not present

## 2018-10-04 DIAGNOSIS — S50812A Abrasion of left forearm, initial encounter: Secondary | ICD-10-CM | POA: Diagnosis not present

## 2018-10-11 DIAGNOSIS — J449 Chronic obstructive pulmonary disease, unspecified: Secondary | ICD-10-CM | POA: Diagnosis not present

## 2018-10-11 DIAGNOSIS — F319 Bipolar disorder, unspecified: Secondary | ICD-10-CM | POA: Diagnosis not present

## 2018-10-11 DIAGNOSIS — F17200 Nicotine dependence, unspecified, uncomplicated: Secondary | ICD-10-CM | POA: Diagnosis not present

## 2018-10-11 DIAGNOSIS — E785 Hyperlipidemia, unspecified: Secondary | ICD-10-CM | POA: Diagnosis not present

## 2018-10-28 DIAGNOSIS — M1711 Unilateral primary osteoarthritis, right knee: Secondary | ICD-10-CM | POA: Diagnosis not present

## 2018-10-28 DIAGNOSIS — M1712 Unilateral primary osteoarthritis, left knee: Secondary | ICD-10-CM | POA: Diagnosis not present

## 2018-10-29 DIAGNOSIS — W19XXXA Unspecified fall, initial encounter: Secondary | ICD-10-CM | POA: Diagnosis not present

## 2018-10-29 DIAGNOSIS — M25562 Pain in left knee: Secondary | ICD-10-CM | POA: Diagnosis not present

## 2018-10-29 DIAGNOSIS — S8002XA Contusion of left knee, initial encounter: Secondary | ICD-10-CM | POA: Diagnosis not present

## 2018-10-29 DIAGNOSIS — S8992XA Unspecified injury of left lower leg, initial encounter: Secondary | ICD-10-CM | POA: Diagnosis not present

## 2018-11-03 DIAGNOSIS — R32 Unspecified urinary incontinence: Secondary | ICD-10-CM | POA: Diagnosis not present

## 2018-11-04 DIAGNOSIS — M1712 Unilateral primary osteoarthritis, left knee: Secondary | ICD-10-CM | POA: Diagnosis not present

## 2018-11-11 DIAGNOSIS — M1711 Unilateral primary osteoarthritis, right knee: Secondary | ICD-10-CM | POA: Diagnosis not present

## 2018-11-21 DIAGNOSIS — Z1339 Encounter for screening examination for other mental health and behavioral disorders: Secondary | ICD-10-CM | POA: Diagnosis not present

## 2018-11-21 DIAGNOSIS — Z0000 Encounter for general adult medical examination without abnormal findings: Secondary | ICD-10-CM | POA: Diagnosis not present

## 2018-11-21 DIAGNOSIS — Z139 Encounter for screening, unspecified: Secondary | ICD-10-CM | POA: Diagnosis not present

## 2018-11-21 DIAGNOSIS — Z136 Encounter for screening for cardiovascular disorders: Secondary | ICD-10-CM | POA: Diagnosis not present

## 2018-11-21 DIAGNOSIS — Z1331 Encounter for screening for depression: Secondary | ICD-10-CM | POA: Diagnosis not present

## 2018-11-21 DIAGNOSIS — F17210 Nicotine dependence, cigarettes, uncomplicated: Secondary | ICD-10-CM | POA: Diagnosis not present

## 2018-11-21 DIAGNOSIS — Z7189 Other specified counseling: Secondary | ICD-10-CM | POA: Diagnosis not present

## 2018-12-20 DIAGNOSIS — F17210 Nicotine dependence, cigarettes, uncomplicated: Secondary | ICD-10-CM | POA: Diagnosis not present

## 2018-12-20 DIAGNOSIS — S40811A Abrasion of right upper arm, initial encounter: Secondary | ICD-10-CM | POA: Diagnosis not present

## 2018-12-20 DIAGNOSIS — Z6824 Body mass index (BMI) 24.0-24.9, adult: Secondary | ICD-10-CM | POA: Diagnosis not present

## 2018-12-20 DIAGNOSIS — F17200 Nicotine dependence, unspecified, uncomplicated: Secondary | ICD-10-CM | POA: Diagnosis not present

## 2019-01-03 DIAGNOSIS — R32 Unspecified urinary incontinence: Secondary | ICD-10-CM | POA: Diagnosis not present

## 2019-01-31 DIAGNOSIS — M1711 Unilateral primary osteoarthritis, right knee: Secondary | ICD-10-CM | POA: Diagnosis not present

## 2019-01-31 DIAGNOSIS — M1712 Unilateral primary osteoarthritis, left knee: Secondary | ICD-10-CM | POA: Diagnosis not present

## 2019-02-06 DIAGNOSIS — H25813 Combined forms of age-related cataract, bilateral: Secondary | ICD-10-CM | POA: Diagnosis not present

## 2019-03-21 DIAGNOSIS — M1711 Unilateral primary osteoarthritis, right knee: Secondary | ICD-10-CM | POA: Diagnosis not present

## 2019-03-28 DIAGNOSIS — M1711 Unilateral primary osteoarthritis, right knee: Secondary | ICD-10-CM | POA: Diagnosis not present

## 2019-05-15 DIAGNOSIS — Z6825 Body mass index (BMI) 25.0-25.9, adult: Secondary | ICD-10-CM | POA: Diagnosis not present

## 2019-05-15 DIAGNOSIS — Z720 Tobacco use: Secondary | ICD-10-CM | POA: Diagnosis not present

## 2019-06-14 DIAGNOSIS — R32 Unspecified urinary incontinence: Secondary | ICD-10-CM | POA: Diagnosis not present

## 2019-06-14 DIAGNOSIS — F319 Bipolar disorder, unspecified: Secondary | ICD-10-CM | POA: Diagnosis not present

## 2019-07-14 DIAGNOSIS — F319 Bipolar disorder, unspecified: Secondary | ICD-10-CM | POA: Diagnosis not present

## 2019-07-14 DIAGNOSIS — R32 Unspecified urinary incontinence: Secondary | ICD-10-CM | POA: Diagnosis not present

## 2019-08-08 DIAGNOSIS — F319 Bipolar disorder, unspecified: Secondary | ICD-10-CM | POA: Diagnosis not present

## 2019-08-08 DIAGNOSIS — R32 Unspecified urinary incontinence: Secondary | ICD-10-CM | POA: Diagnosis not present

## 2019-09-04 DIAGNOSIS — F319 Bipolar disorder, unspecified: Secondary | ICD-10-CM | POA: Diagnosis not present

## 2019-09-04 DIAGNOSIS — R32 Unspecified urinary incontinence: Secondary | ICD-10-CM | POA: Diagnosis not present

## 2019-11-03 DIAGNOSIS — J441 Chronic obstructive pulmonary disease with (acute) exacerbation: Secondary | ICD-10-CM | POA: Diagnosis not present

## 2019-11-03 DIAGNOSIS — F319 Bipolar disorder, unspecified: Secondary | ICD-10-CM | POA: Diagnosis not present

## 2019-11-03 DIAGNOSIS — E785 Hyperlipidemia, unspecified: Secondary | ICD-10-CM | POA: Diagnosis not present

## 2019-11-10 DIAGNOSIS — R509 Fever, unspecified: Secondary | ICD-10-CM | POA: Diagnosis not present

## 2019-11-10 DIAGNOSIS — Z6824 Body mass index (BMI) 24.0-24.9, adult: Secondary | ICD-10-CM | POA: Diagnosis not present

## 2019-11-10 DIAGNOSIS — J90 Pleural effusion, not elsewhere classified: Secondary | ICD-10-CM | POA: Diagnosis not present

## 2019-11-10 DIAGNOSIS — J441 Chronic obstructive pulmonary disease with (acute) exacerbation: Secondary | ICD-10-CM | POA: Diagnosis not present

## 2019-11-10 DIAGNOSIS — J189 Pneumonia, unspecified organism: Secondary | ICD-10-CM | POA: Diagnosis not present

## 2019-11-10 DIAGNOSIS — Z20822 Contact with and (suspected) exposure to covid-19: Secondary | ICD-10-CM | POA: Diagnosis not present

## 2019-11-14 DIAGNOSIS — M1711 Unilateral primary osteoarthritis, right knee: Secondary | ICD-10-CM | POA: Diagnosis not present

## 2019-11-14 DIAGNOSIS — M1712 Unilateral primary osteoarthritis, left knee: Secondary | ICD-10-CM | POA: Diagnosis not present

## 2019-11-20 DIAGNOSIS — F1220 Cannabis dependence, uncomplicated: Secondary | ICD-10-CM | POA: Diagnosis not present

## 2019-11-20 DIAGNOSIS — F319 Bipolar disorder, unspecified: Secondary | ICD-10-CM | POA: Diagnosis not present

## 2019-11-20 DIAGNOSIS — M1710 Unilateral primary osteoarthritis, unspecified knee: Secondary | ICD-10-CM | POA: Diagnosis not present

## 2019-11-20 DIAGNOSIS — F17200 Nicotine dependence, unspecified, uncomplicated: Secondary | ICD-10-CM | POA: Diagnosis not present

## 2019-11-20 DIAGNOSIS — M25521 Pain in right elbow: Secondary | ICD-10-CM | POA: Diagnosis not present

## 2019-11-20 DIAGNOSIS — F17210 Nicotine dependence, cigarettes, uncomplicated: Secondary | ICD-10-CM | POA: Diagnosis not present

## 2019-11-27 DIAGNOSIS — F319 Bipolar disorder, unspecified: Secondary | ICD-10-CM | POA: Diagnosis not present

## 2019-11-27 DIAGNOSIS — R32 Unspecified urinary incontinence: Secondary | ICD-10-CM | POA: Diagnosis not present

## 2019-12-04 DIAGNOSIS — J441 Chronic obstructive pulmonary disease with (acute) exacerbation: Secondary | ICD-10-CM | POA: Diagnosis not present

## 2019-12-04 DIAGNOSIS — M1710 Unilateral primary osteoarthritis, unspecified knee: Secondary | ICD-10-CM | POA: Diagnosis not present

## 2019-12-04 DIAGNOSIS — E785 Hyperlipidemia, unspecified: Secondary | ICD-10-CM | POA: Diagnosis not present

## 2019-12-04 DIAGNOSIS — F319 Bipolar disorder, unspecified: Secondary | ICD-10-CM | POA: Diagnosis not present

## 2019-12-18 DIAGNOSIS — F319 Bipolar disorder, unspecified: Secondary | ICD-10-CM | POA: Diagnosis not present

## 2019-12-18 DIAGNOSIS — R32 Unspecified urinary incontinence: Secondary | ICD-10-CM | POA: Diagnosis not present

## 2020-01-03 DIAGNOSIS — E785 Hyperlipidemia, unspecified: Secondary | ICD-10-CM | POA: Diagnosis not present

## 2020-01-03 DIAGNOSIS — J441 Chronic obstructive pulmonary disease with (acute) exacerbation: Secondary | ICD-10-CM | POA: Diagnosis not present

## 2020-01-03 DIAGNOSIS — M1710 Unilateral primary osteoarthritis, unspecified knee: Secondary | ICD-10-CM | POA: Diagnosis not present

## 2020-01-03 DIAGNOSIS — F319 Bipolar disorder, unspecified: Secondary | ICD-10-CM | POA: Diagnosis not present

## 2020-01-11 DIAGNOSIS — R32 Unspecified urinary incontinence: Secondary | ICD-10-CM | POA: Diagnosis not present

## 2020-01-11 DIAGNOSIS — F319 Bipolar disorder, unspecified: Secondary | ICD-10-CM | POA: Diagnosis not present

## 2020-01-18 DIAGNOSIS — R2231 Localized swelling, mass and lump, right upper limb: Secondary | ICD-10-CM | POA: Diagnosis not present

## 2020-01-18 DIAGNOSIS — R2232 Localized swelling, mass and lump, left upper limb: Secondary | ICD-10-CM | POA: Diagnosis not present

## 2020-01-18 DIAGNOSIS — M25521 Pain in right elbow: Secondary | ICD-10-CM | POA: Diagnosis not present

## 2020-01-18 DIAGNOSIS — M25522 Pain in left elbow: Secondary | ICD-10-CM | POA: Diagnosis not present

## 2020-02-03 DIAGNOSIS — J441 Chronic obstructive pulmonary disease with (acute) exacerbation: Secondary | ICD-10-CM | POA: Diagnosis not present

## 2020-02-03 DIAGNOSIS — F319 Bipolar disorder, unspecified: Secondary | ICD-10-CM | POA: Diagnosis not present

## 2020-02-03 DIAGNOSIS — M1710 Unilateral primary osteoarthritis, unspecified knee: Secondary | ICD-10-CM | POA: Diagnosis not present

## 2020-02-03 DIAGNOSIS — E785 Hyperlipidemia, unspecified: Secondary | ICD-10-CM | POA: Diagnosis not present

## 2020-02-05 DIAGNOSIS — R32 Unspecified urinary incontinence: Secondary | ICD-10-CM | POA: Diagnosis not present

## 2020-02-20 DIAGNOSIS — M1712 Unilateral primary osteoarthritis, left knee: Secondary | ICD-10-CM | POA: Diagnosis not present

## 2020-03-05 DIAGNOSIS — R32 Unspecified urinary incontinence: Secondary | ICD-10-CM | POA: Diagnosis not present

## 2020-03-05 DIAGNOSIS — F319 Bipolar disorder, unspecified: Secondary | ICD-10-CM | POA: Diagnosis not present

## 2020-03-05 DIAGNOSIS — M1710 Unilateral primary osteoarthritis, unspecified knee: Secondary | ICD-10-CM | POA: Diagnosis not present

## 2020-03-05 DIAGNOSIS — J441 Chronic obstructive pulmonary disease with (acute) exacerbation: Secondary | ICD-10-CM | POA: Diagnosis not present

## 2020-03-05 DIAGNOSIS — E785 Hyperlipidemia, unspecified: Secondary | ICD-10-CM | POA: Diagnosis not present

## 2020-04-02 DIAGNOSIS — M1710 Unilateral primary osteoarthritis, unspecified knee: Secondary | ICD-10-CM | POA: Diagnosis not present

## 2020-04-02 DIAGNOSIS — F319 Bipolar disorder, unspecified: Secondary | ICD-10-CM | POA: Diagnosis not present

## 2020-04-02 DIAGNOSIS — R32 Unspecified urinary incontinence: Secondary | ICD-10-CM | POA: Diagnosis not present

## 2020-04-02 DIAGNOSIS — J441 Chronic obstructive pulmonary disease with (acute) exacerbation: Secondary | ICD-10-CM | POA: Diagnosis not present

## 2020-04-02 DIAGNOSIS — E785 Hyperlipidemia, unspecified: Secondary | ICD-10-CM | POA: Diagnosis not present

## 2020-04-22 DIAGNOSIS — E785 Hyperlipidemia, unspecified: Secondary | ICD-10-CM | POA: Diagnosis not present

## 2020-04-29 DIAGNOSIS — Z139 Encounter for screening, unspecified: Secondary | ICD-10-CM | POA: Diagnosis not present

## 2020-04-29 DIAGNOSIS — H109 Unspecified conjunctivitis: Secondary | ICD-10-CM | POA: Diagnosis not present

## 2020-04-29 DIAGNOSIS — J029 Acute pharyngitis, unspecified: Secondary | ICD-10-CM | POA: Diagnosis not present

## 2020-04-29 DIAGNOSIS — B9689 Other specified bacterial agents as the cause of diseases classified elsewhere: Secondary | ICD-10-CM | POA: Diagnosis not present

## 2020-04-29 DIAGNOSIS — F17210 Nicotine dependence, cigarettes, uncomplicated: Secondary | ICD-10-CM | POA: Diagnosis not present

## 2020-04-29 DIAGNOSIS — Z1331 Encounter for screening for depression: Secondary | ICD-10-CM | POA: Diagnosis not present

## 2020-04-29 DIAGNOSIS — Z0000 Encounter for general adult medical examination without abnormal findings: Secondary | ICD-10-CM | POA: Diagnosis not present

## 2020-04-29 DIAGNOSIS — F17200 Nicotine dependence, unspecified, uncomplicated: Secondary | ICD-10-CM | POA: Diagnosis not present

## 2020-04-29 DIAGNOSIS — J019 Acute sinusitis, unspecified: Secondary | ICD-10-CM | POA: Diagnosis not present

## 2020-05-01 DIAGNOSIS — Z6825 Body mass index (BMI) 25.0-25.9, adult: Secondary | ICD-10-CM | POA: Diagnosis not present

## 2020-05-01 DIAGNOSIS — H109 Unspecified conjunctivitis: Secondary | ICD-10-CM | POA: Diagnosis not present

## 2020-05-01 DIAGNOSIS — J302 Other seasonal allergic rhinitis: Secondary | ICD-10-CM | POA: Diagnosis not present

## 2020-05-03 DIAGNOSIS — E785 Hyperlipidemia, unspecified: Secondary | ICD-10-CM | POA: Diagnosis not present

## 2020-05-03 DIAGNOSIS — J449 Chronic obstructive pulmonary disease, unspecified: Secondary | ICD-10-CM | POA: Diagnosis not present

## 2020-05-04 DIAGNOSIS — R32 Unspecified urinary incontinence: Secondary | ICD-10-CM | POA: Diagnosis not present

## 2020-05-09 DIAGNOSIS — Z1231 Encounter for screening mammogram for malignant neoplasm of breast: Secondary | ICD-10-CM | POA: Diagnosis not present

## 2020-05-30 DIAGNOSIS — F319 Bipolar disorder, unspecified: Secondary | ICD-10-CM | POA: Diagnosis not present

## 2020-05-30 DIAGNOSIS — G629 Polyneuropathy, unspecified: Secondary | ICD-10-CM | POA: Diagnosis not present

## 2020-05-30 DIAGNOSIS — J449 Chronic obstructive pulmonary disease, unspecified: Secondary | ICD-10-CM | POA: Diagnosis not present

## 2020-05-30 DIAGNOSIS — N3941 Urge incontinence: Secondary | ICD-10-CM | POA: Diagnosis not present

## 2020-06-02 DIAGNOSIS — E785 Hyperlipidemia, unspecified: Secondary | ICD-10-CM | POA: Diagnosis not present

## 2020-06-02 DIAGNOSIS — J449 Chronic obstructive pulmonary disease, unspecified: Secondary | ICD-10-CM | POA: Diagnosis not present

## 2020-06-18 DIAGNOSIS — F319 Bipolar disorder, unspecified: Secondary | ICD-10-CM | POA: Diagnosis not present

## 2020-06-18 DIAGNOSIS — R32 Unspecified urinary incontinence: Secondary | ICD-10-CM | POA: Diagnosis not present

## 2020-06-26 DIAGNOSIS — M1712 Unilateral primary osteoarthritis, left knee: Secondary | ICD-10-CM | POA: Diagnosis not present

## 2020-07-03 DIAGNOSIS — M1711 Unilateral primary osteoarthritis, right knee: Secondary | ICD-10-CM | POA: Diagnosis not present

## 2020-07-03 DIAGNOSIS — F319 Bipolar disorder, unspecified: Secondary | ICD-10-CM | POA: Diagnosis not present

## 2020-07-03 DIAGNOSIS — J449 Chronic obstructive pulmonary disease, unspecified: Secondary | ICD-10-CM | POA: Diagnosis not present

## 2020-07-03 DIAGNOSIS — E785 Hyperlipidemia, unspecified: Secondary | ICD-10-CM | POA: Diagnosis not present

## 2020-08-02 DIAGNOSIS — F319 Bipolar disorder, unspecified: Secondary | ICD-10-CM | POA: Diagnosis not present

## 2020-08-02 DIAGNOSIS — E785 Hyperlipidemia, unspecified: Secondary | ICD-10-CM | POA: Diagnosis not present

## 2020-08-02 DIAGNOSIS — J449 Chronic obstructive pulmonary disease, unspecified: Secondary | ICD-10-CM | POA: Diagnosis not present

## 2020-08-23 DIAGNOSIS — J029 Acute pharyngitis, unspecified: Secondary | ICD-10-CM | POA: Diagnosis not present

## 2020-08-23 DIAGNOSIS — J449 Chronic obstructive pulmonary disease, unspecified: Secondary | ICD-10-CM | POA: Diagnosis not present

## 2020-08-23 DIAGNOSIS — R059 Cough, unspecified: Secondary | ICD-10-CM | POA: Diagnosis not present

## 2020-08-23 DIAGNOSIS — Z20822 Contact with and (suspected) exposure to covid-19: Secondary | ICD-10-CM | POA: Diagnosis not present

## 2020-08-23 DIAGNOSIS — R112 Nausea with vomiting, unspecified: Secondary | ICD-10-CM | POA: Diagnosis not present

## 2020-09-02 DIAGNOSIS — J449 Chronic obstructive pulmonary disease, unspecified: Secondary | ICD-10-CM | POA: Diagnosis not present

## 2020-09-02 DIAGNOSIS — F319 Bipolar disorder, unspecified: Secondary | ICD-10-CM | POA: Diagnosis not present

## 2020-09-02 DIAGNOSIS — E785 Hyperlipidemia, unspecified: Secondary | ICD-10-CM | POA: Diagnosis not present

## 2020-09-06 DIAGNOSIS — E785 Hyperlipidemia, unspecified: Secondary | ICD-10-CM | POA: Diagnosis not present

## 2020-09-13 DIAGNOSIS — R112 Nausea with vomiting, unspecified: Secondary | ICD-10-CM | POA: Diagnosis not present

## 2020-09-13 DIAGNOSIS — J449 Chronic obstructive pulmonary disease, unspecified: Secondary | ICD-10-CM | POA: Diagnosis not present

## 2020-09-13 DIAGNOSIS — E785 Hyperlipidemia, unspecified: Secondary | ICD-10-CM | POA: Diagnosis not present

## 2020-09-13 DIAGNOSIS — F17200 Nicotine dependence, unspecified, uncomplicated: Secondary | ICD-10-CM | POA: Diagnosis not present

## 2020-09-19 DIAGNOSIS — Z20822 Contact with and (suspected) exposure to covid-19: Secondary | ICD-10-CM | POA: Diagnosis not present

## 2020-09-19 DIAGNOSIS — R509 Fever, unspecified: Secondary | ICD-10-CM | POA: Diagnosis not present

## 2020-09-19 DIAGNOSIS — U071 COVID-19: Secondary | ICD-10-CM | POA: Diagnosis not present

## 2020-09-19 DIAGNOSIS — J449 Chronic obstructive pulmonary disease, unspecified: Secondary | ICD-10-CM | POA: Diagnosis not present

## 2020-09-19 DIAGNOSIS — J029 Acute pharyngitis, unspecified: Secondary | ICD-10-CM | POA: Diagnosis not present

## 2020-10-03 DIAGNOSIS — E785 Hyperlipidemia, unspecified: Secondary | ICD-10-CM | POA: Diagnosis not present

## 2020-10-03 DIAGNOSIS — J449 Chronic obstructive pulmonary disease, unspecified: Secondary | ICD-10-CM | POA: Diagnosis not present

## 2020-10-03 DIAGNOSIS — F319 Bipolar disorder, unspecified: Secondary | ICD-10-CM | POA: Diagnosis not present

## 2020-10-17 DIAGNOSIS — M1712 Unilateral primary osteoarthritis, left knee: Secondary | ICD-10-CM | POA: Diagnosis not present

## 2020-10-21 DIAGNOSIS — M1711 Unilateral primary osteoarthritis, right knee: Secondary | ICD-10-CM | POA: Diagnosis not present

## 2020-10-25 DIAGNOSIS — F17200 Nicotine dependence, unspecified, uncomplicated: Secondary | ICD-10-CM | POA: Diagnosis not present

## 2020-10-25 DIAGNOSIS — J441 Chronic obstructive pulmonary disease with (acute) exacerbation: Secondary | ICD-10-CM | POA: Diagnosis not present

## 2020-10-25 DIAGNOSIS — F17210 Nicotine dependence, cigarettes, uncomplicated: Secondary | ICD-10-CM | POA: Diagnosis not present

## 2020-10-25 DIAGNOSIS — G5603 Carpal tunnel syndrome, bilateral upper limbs: Secondary | ICD-10-CM | POA: Diagnosis not present

## 2020-11-02 DIAGNOSIS — J449 Chronic obstructive pulmonary disease, unspecified: Secondary | ICD-10-CM | POA: Diagnosis not present

## 2020-11-02 DIAGNOSIS — E785 Hyperlipidemia, unspecified: Secondary | ICD-10-CM | POA: Diagnosis not present

## 2020-11-06 DIAGNOSIS — J449 Chronic obstructive pulmonary disease, unspecified: Secondary | ICD-10-CM | POA: Diagnosis not present

## 2020-11-07 DIAGNOSIS — J449 Chronic obstructive pulmonary disease, unspecified: Secondary | ICD-10-CM | POA: Diagnosis not present

## 2020-11-15 DIAGNOSIS — G5602 Carpal tunnel syndrome, left upper limb: Secondary | ICD-10-CM | POA: Diagnosis not present

## 2020-11-15 DIAGNOSIS — G5601 Carpal tunnel syndrome, right upper limb: Secondary | ICD-10-CM | POA: Diagnosis not present

## 2020-11-15 DIAGNOSIS — G5622 Lesion of ulnar nerve, left upper limb: Secondary | ICD-10-CM | POA: Diagnosis not present

## 2020-11-15 DIAGNOSIS — G5621 Lesion of ulnar nerve, right upper limb: Secondary | ICD-10-CM | POA: Diagnosis not present

## 2020-12-03 DIAGNOSIS — K3189 Other diseases of stomach and duodenum: Secondary | ICD-10-CM | POA: Diagnosis not present

## 2020-12-03 DIAGNOSIS — A049 Bacterial intestinal infection, unspecified: Secondary | ICD-10-CM | POA: Diagnosis not present

## 2020-12-03 DIAGNOSIS — Z9049 Acquired absence of other specified parts of digestive tract: Secondary | ICD-10-CM | POA: Diagnosis not present

## 2020-12-03 DIAGNOSIS — K567 Ileus, unspecified: Secondary | ICD-10-CM | POA: Diagnosis not present

## 2020-12-03 DIAGNOSIS — J449 Chronic obstructive pulmonary disease, unspecified: Secondary | ICD-10-CM | POA: Diagnosis not present

## 2020-12-03 DIAGNOSIS — F319 Bipolar disorder, unspecified: Secondary | ICD-10-CM | POA: Diagnosis not present

## 2020-12-03 DIAGNOSIS — K5939 Other megacolon: Secondary | ICD-10-CM | POA: Diagnosis not present

## 2020-12-03 DIAGNOSIS — E785 Hyperlipidemia, unspecified: Secondary | ICD-10-CM | POA: Diagnosis not present

## 2020-12-03 DIAGNOSIS — R112 Nausea with vomiting, unspecified: Secondary | ICD-10-CM | POA: Diagnosis not present

## 2020-12-03 DIAGNOSIS — Z792 Long term (current) use of antibiotics: Secondary | ICD-10-CM | POA: Diagnosis not present

## 2020-12-03 DIAGNOSIS — K6389 Other specified diseases of intestine: Secondary | ICD-10-CM | POA: Diagnosis not present

## 2020-12-03 DIAGNOSIS — R109 Unspecified abdominal pain: Secondary | ICD-10-CM | POA: Diagnosis not present

## 2020-12-03 DIAGNOSIS — F419 Anxiety disorder, unspecified: Secondary | ICD-10-CM | POA: Diagnosis not present

## 2020-12-03 DIAGNOSIS — R1084 Generalized abdominal pain: Secondary | ICD-10-CM | POA: Diagnosis not present

## 2020-12-03 DIAGNOSIS — F17210 Nicotine dependence, cigarettes, uncomplicated: Secondary | ICD-10-CM | POA: Diagnosis not present

## 2020-12-03 DIAGNOSIS — K529 Noninfective gastroenteritis and colitis, unspecified: Secondary | ICD-10-CM | POA: Diagnosis not present

## 2020-12-03 DIAGNOSIS — Z20822 Contact with and (suspected) exposure to covid-19: Secondary | ICD-10-CM | POA: Diagnosis not present

## 2020-12-03 DIAGNOSIS — E876 Hypokalemia: Secondary | ICD-10-CM | POA: Diagnosis not present

## 2020-12-03 DIAGNOSIS — F1290 Cannabis use, unspecified, uncomplicated: Secondary | ICD-10-CM | POA: Diagnosis not present

## 2020-12-03 DIAGNOSIS — Z6825 Body mass index (BMI) 25.0-25.9, adult: Secondary | ICD-10-CM | POA: Diagnosis not present

## 2020-12-03 DIAGNOSIS — R509 Fever, unspecified: Secondary | ICD-10-CM | POA: Diagnosis not present

## 2020-12-03 DIAGNOSIS — I700 Atherosclerosis of aorta: Secondary | ICD-10-CM | POA: Diagnosis not present

## 2020-12-08 DIAGNOSIS — J449 Chronic obstructive pulmonary disease, unspecified: Secondary | ICD-10-CM | POA: Diagnosis not present

## 2020-12-09 DIAGNOSIS — Z7689 Persons encountering health services in other specified circumstances: Secondary | ICD-10-CM | POA: Diagnosis not present

## 2020-12-09 DIAGNOSIS — K529 Noninfective gastroenteritis and colitis, unspecified: Secondary | ICD-10-CM | POA: Diagnosis not present

## 2020-12-09 DIAGNOSIS — Z6825 Body mass index (BMI) 25.0-25.9, adult: Secondary | ICD-10-CM | POA: Diagnosis not present

## 2020-12-09 DIAGNOSIS — K567 Ileus, unspecified: Secondary | ICD-10-CM | POA: Diagnosis not present

## 2020-12-17 DIAGNOSIS — K529 Noninfective gastroenteritis and colitis, unspecified: Secondary | ICD-10-CM

## 2020-12-17 DIAGNOSIS — Z1211 Encounter for screening for malignant neoplasm of colon: Secondary | ICD-10-CM | POA: Diagnosis not present

## 2020-12-17 HISTORY — DX: Noninfective gastroenteritis and colitis, unspecified: K52.9

## 2021-01-02 DIAGNOSIS — E785 Hyperlipidemia, unspecified: Secondary | ICD-10-CM | POA: Diagnosis not present

## 2021-01-02 DIAGNOSIS — J449 Chronic obstructive pulmonary disease, unspecified: Secondary | ICD-10-CM | POA: Diagnosis not present

## 2021-01-06 DIAGNOSIS — Z1152 Encounter for screening for COVID-19: Secondary | ICD-10-CM | POA: Diagnosis not present

## 2021-01-06 DIAGNOSIS — Z20822 Contact with and (suspected) exposure to covid-19: Secondary | ICD-10-CM | POA: Diagnosis not present

## 2021-01-06 DIAGNOSIS — J020 Streptococcal pharyngitis: Secondary | ICD-10-CM | POA: Diagnosis not present

## 2021-01-06 DIAGNOSIS — J441 Chronic obstructive pulmonary disease with (acute) exacerbation: Secondary | ICD-10-CM | POA: Diagnosis not present

## 2021-01-13 DIAGNOSIS — Z6827 Body mass index (BMI) 27.0-27.9, adult: Secondary | ICD-10-CM | POA: Diagnosis not present

## 2021-01-13 DIAGNOSIS — J449 Chronic obstructive pulmonary disease, unspecified: Secondary | ICD-10-CM | POA: Diagnosis not present

## 2021-01-30 DIAGNOSIS — M5412 Radiculopathy, cervical region: Secondary | ICD-10-CM

## 2021-01-30 DIAGNOSIS — G5603 Carpal tunnel syndrome, bilateral upper limbs: Secondary | ICD-10-CM

## 2021-01-30 HISTORY — DX: Carpal tunnel syndrome, bilateral upper limbs: G56.03

## 2021-01-30 HISTORY — DX: Radiculopathy, cervical region: M54.12

## 2021-02-02 DIAGNOSIS — E785 Hyperlipidemia, unspecified: Secondary | ICD-10-CM | POA: Diagnosis not present

## 2021-02-02 DIAGNOSIS — F319 Bipolar disorder, unspecified: Secondary | ICD-10-CM | POA: Diagnosis not present

## 2021-02-02 DIAGNOSIS — J449 Chronic obstructive pulmonary disease, unspecified: Secondary | ICD-10-CM | POA: Diagnosis not present

## 2021-02-20 DIAGNOSIS — Z1211 Encounter for screening for malignant neoplasm of colon: Secondary | ICD-10-CM | POA: Diagnosis not present

## 2021-02-20 DIAGNOSIS — J449 Chronic obstructive pulmonary disease, unspecified: Secondary | ICD-10-CM | POA: Diagnosis not present

## 2021-02-20 DIAGNOSIS — F1210 Cannabis abuse, uncomplicated: Secondary | ICD-10-CM | POA: Diagnosis not present

## 2021-03-05 DIAGNOSIS — J449 Chronic obstructive pulmonary disease, unspecified: Secondary | ICD-10-CM | POA: Diagnosis not present

## 2021-03-05 DIAGNOSIS — F319 Bipolar disorder, unspecified: Secondary | ICD-10-CM | POA: Diagnosis not present

## 2021-03-05 DIAGNOSIS — E785 Hyperlipidemia, unspecified: Secondary | ICD-10-CM | POA: Diagnosis not present

## 2021-03-21 DIAGNOSIS — G5601 Carpal tunnel syndrome, right upper limb: Secondary | ICD-10-CM | POA: Diagnosis not present

## 2021-03-21 DIAGNOSIS — G5602 Carpal tunnel syndrome, left upper limb: Secondary | ICD-10-CM | POA: Diagnosis not present

## 2021-03-21 DIAGNOSIS — M1712 Unilateral primary osteoarthritis, left knee: Secondary | ICD-10-CM | POA: Diagnosis not present

## 2021-03-28 DIAGNOSIS — J449 Chronic obstructive pulmonary disease, unspecified: Secondary | ICD-10-CM | POA: Diagnosis not present

## 2021-03-31 DIAGNOSIS — Z5900 Homelessness unspecified: Secondary | ICD-10-CM | POA: Diagnosis not present

## 2021-03-31 DIAGNOSIS — M25561 Pain in right knee: Secondary | ICD-10-CM | POA: Diagnosis not present

## 2021-03-31 DIAGNOSIS — Z043 Encounter for examination and observation following other accident: Secondary | ICD-10-CM | POA: Diagnosis not present

## 2021-04-02 DIAGNOSIS — J449 Chronic obstructive pulmonary disease, unspecified: Secondary | ICD-10-CM | POA: Diagnosis not present

## 2021-04-02 DIAGNOSIS — E785 Hyperlipidemia, unspecified: Secondary | ICD-10-CM | POA: Diagnosis not present

## 2021-04-02 DIAGNOSIS — F319 Bipolar disorder, unspecified: Secondary | ICD-10-CM | POA: Diagnosis not present

## 2021-04-14 DIAGNOSIS — F1290 Cannabis use, unspecified, uncomplicated: Secondary | ICD-10-CM | POA: Diagnosis not present

## 2021-04-14 DIAGNOSIS — F17211 Nicotine dependence, cigarettes, in remission: Secondary | ICD-10-CM | POA: Diagnosis not present

## 2021-04-14 DIAGNOSIS — G5602 Carpal tunnel syndrome, left upper limb: Secondary | ICD-10-CM | POA: Diagnosis not present

## 2021-04-14 DIAGNOSIS — J449 Chronic obstructive pulmonary disease, unspecified: Secondary | ICD-10-CM | POA: Diagnosis not present

## 2021-04-14 DIAGNOSIS — F17210 Nicotine dependence, cigarettes, uncomplicated: Secondary | ICD-10-CM | POA: Diagnosis not present

## 2021-04-17 DIAGNOSIS — M1711 Unilateral primary osteoarthritis, right knee: Secondary | ICD-10-CM | POA: Diagnosis not present

## 2021-04-29 DIAGNOSIS — J9610 Chronic respiratory failure, unspecified whether with hypoxia or hypercapnia: Secondary | ICD-10-CM | POA: Diagnosis not present

## 2021-04-29 DIAGNOSIS — G5602 Carpal tunnel syndrome, left upper limb: Secondary | ICD-10-CM | POA: Diagnosis not present

## 2021-04-29 DIAGNOSIS — J449 Chronic obstructive pulmonary disease, unspecified: Secondary | ICD-10-CM | POA: Diagnosis not present

## 2021-04-29 DIAGNOSIS — F319 Bipolar disorder, unspecified: Secondary | ICD-10-CM | POA: Diagnosis not present

## 2021-05-03 DIAGNOSIS — E785 Hyperlipidemia, unspecified: Secondary | ICD-10-CM | POA: Diagnosis not present

## 2021-05-03 DIAGNOSIS — G629 Polyneuropathy, unspecified: Secondary | ICD-10-CM | POA: Diagnosis not present

## 2021-05-15 ENCOUNTER — Other Ambulatory Visit: Payer: Self-pay | Admitting: Family Medicine

## 2021-05-15 DIAGNOSIS — Z1231 Encounter for screening mammogram for malignant neoplasm of breast: Secondary | ICD-10-CM

## 2021-05-21 ENCOUNTER — Ambulatory Visit
Admission: RE | Admit: 2021-05-21 | Discharge: 2021-05-21 | Disposition: A | Discharge status: Home / Self Care | Payer: Medicare HMO | Source: Ambulatory Visit | Attending: Family Medicine | Admitting: Family Medicine

## 2021-05-21 DIAGNOSIS — Z1231 Encounter for screening mammogram for malignant neoplasm of breast: Secondary | ICD-10-CM

## 2021-05-21 DIAGNOSIS — E785 Hyperlipidemia, unspecified: Secondary | ICD-10-CM | POA: Diagnosis not present

## 2021-05-27 DIAGNOSIS — J449 Chronic obstructive pulmonary disease, unspecified: Secondary | ICD-10-CM | POA: Diagnosis not present

## 2021-05-27 DIAGNOSIS — Z1389 Encounter for screening for other disorder: Secondary | ICD-10-CM | POA: Diagnosis not present

## 2021-05-27 DIAGNOSIS — Z0000 Encounter for general adult medical examination without abnormal findings: Secondary | ICD-10-CM | POA: Diagnosis not present

## 2021-05-27 DIAGNOSIS — Z1331 Encounter for screening for depression: Secondary | ICD-10-CM | POA: Diagnosis not present

## 2021-05-27 DIAGNOSIS — Z23 Encounter for immunization: Secondary | ICD-10-CM | POA: Diagnosis not present

## 2021-05-27 DIAGNOSIS — E785 Hyperlipidemia, unspecified: Secondary | ICD-10-CM | POA: Diagnosis not present

## 2021-05-27 DIAGNOSIS — Z139 Encounter for screening, unspecified: Secondary | ICD-10-CM | POA: Diagnosis not present

## 2021-05-27 DIAGNOSIS — J9610 Chronic respiratory failure, unspecified whether with hypoxia or hypercapnia: Secondary | ICD-10-CM | POA: Diagnosis not present

## 2021-05-27 DIAGNOSIS — L239 Allergic contact dermatitis, unspecified cause: Secondary | ICD-10-CM | POA: Diagnosis not present

## 2021-05-27 DIAGNOSIS — Z1339 Encounter for screening examination for other mental health and behavioral disorders: Secondary | ICD-10-CM | POA: Diagnosis not present

## 2021-05-29 DIAGNOSIS — I2510 Atherosclerotic heart disease of native coronary artery without angina pectoris: Secondary | ICD-10-CM | POA: Diagnosis not present

## 2021-05-29 DIAGNOSIS — M8580 Other specified disorders of bone density and structure, unspecified site: Secondary | ICD-10-CM | POA: Diagnosis not present

## 2021-05-29 DIAGNOSIS — E2839 Other primary ovarian failure: Secondary | ICD-10-CM | POA: Diagnosis not present

## 2021-05-29 DIAGNOSIS — J432 Centrilobular emphysema: Secondary | ICD-10-CM | POA: Diagnosis not present

## 2021-05-29 DIAGNOSIS — Z122 Encounter for screening for malignant neoplasm of respiratory organs: Secondary | ICD-10-CM | POA: Diagnosis not present

## 2021-05-29 DIAGNOSIS — M8589 Other specified disorders of bone density and structure, multiple sites: Secondary | ICD-10-CM | POA: Diagnosis not present

## 2021-05-29 DIAGNOSIS — Z87891 Personal history of nicotine dependence: Secondary | ICD-10-CM | POA: Diagnosis not present

## 2021-05-29 DIAGNOSIS — E278 Other specified disorders of adrenal gland: Secondary | ICD-10-CM | POA: Diagnosis not present

## 2021-05-29 DIAGNOSIS — I700 Atherosclerosis of aorta: Secondary | ICD-10-CM | POA: Diagnosis not present

## 2021-06-02 DIAGNOSIS — G629 Polyneuropathy, unspecified: Secondary | ICD-10-CM | POA: Diagnosis not present

## 2021-06-02 DIAGNOSIS — E785 Hyperlipidemia, unspecified: Secondary | ICD-10-CM | POA: Diagnosis not present

## 2021-06-10 DIAGNOSIS — J439 Emphysema, unspecified: Secondary | ICD-10-CM | POA: Diagnosis not present

## 2021-06-10 DIAGNOSIS — R8290 Unspecified abnormal findings in urine: Secondary | ICD-10-CM | POA: Diagnosis not present

## 2021-06-10 DIAGNOSIS — M8580 Other specified disorders of bone density and structure, unspecified site: Secondary | ICD-10-CM | POA: Diagnosis not present

## 2021-06-10 DIAGNOSIS — I700 Atherosclerosis of aorta: Secondary | ICD-10-CM | POA: Diagnosis not present

## 2021-06-12 DIAGNOSIS — Z20822 Contact with and (suspected) exposure to covid-19: Secondary | ICD-10-CM | POA: Diagnosis not present

## 2021-06-12 DIAGNOSIS — Z6829 Body mass index (BMI) 29.0-29.9, adult: Secondary | ICD-10-CM | POA: Diagnosis not present

## 2021-06-12 DIAGNOSIS — Z1152 Encounter for screening for COVID-19: Secondary | ICD-10-CM | POA: Diagnosis not present

## 2021-06-12 DIAGNOSIS — J439 Emphysema, unspecified: Secondary | ICD-10-CM | POA: Diagnosis not present

## 2021-06-13 DIAGNOSIS — J449 Chronic obstructive pulmonary disease, unspecified: Secondary | ICD-10-CM | POA: Diagnosis not present

## 2021-06-24 DIAGNOSIS — G8929 Other chronic pain: Secondary | ICD-10-CM | POA: Diagnosis not present

## 2021-06-24 DIAGNOSIS — M545 Low back pain, unspecified: Secondary | ICD-10-CM | POA: Diagnosis not present

## 2021-06-26 DIAGNOSIS — L309 Dermatitis, unspecified: Secondary | ICD-10-CM | POA: Diagnosis not present

## 2021-06-26 DIAGNOSIS — L0100 Impetigo, unspecified: Secondary | ICD-10-CM | POA: Diagnosis not present

## 2021-07-03 DIAGNOSIS — J439 Emphysema, unspecified: Secondary | ICD-10-CM | POA: Diagnosis not present

## 2021-07-03 DIAGNOSIS — I700 Atherosclerosis of aorta: Secondary | ICD-10-CM | POA: Diagnosis not present

## 2021-07-03 DIAGNOSIS — M25552 Pain in left hip: Secondary | ICD-10-CM | POA: Diagnosis not present

## 2021-07-03 DIAGNOSIS — M545 Low back pain, unspecified: Secondary | ICD-10-CM | POA: Diagnosis not present

## 2021-07-03 DIAGNOSIS — M6281 Muscle weakness (generalized): Secondary | ICD-10-CM | POA: Diagnosis not present

## 2021-07-09 DIAGNOSIS — M25552 Pain in left hip: Secondary | ICD-10-CM | POA: Diagnosis not present

## 2021-07-09 DIAGNOSIS — M545 Low back pain, unspecified: Secondary | ICD-10-CM | POA: Diagnosis not present

## 2021-07-09 DIAGNOSIS — M6281 Muscle weakness (generalized): Secondary | ICD-10-CM | POA: Diagnosis not present

## 2021-07-14 DIAGNOSIS — M545 Low back pain, unspecified: Secondary | ICD-10-CM | POA: Diagnosis not present

## 2021-07-14 DIAGNOSIS — M1712 Unilateral primary osteoarthritis, left knee: Secondary | ICD-10-CM | POA: Diagnosis not present

## 2021-07-14 DIAGNOSIS — M25552 Pain in left hip: Secondary | ICD-10-CM | POA: Diagnosis not present

## 2021-07-14 DIAGNOSIS — M6281 Muscle weakness (generalized): Secondary | ICD-10-CM | POA: Diagnosis not present

## 2021-07-16 DIAGNOSIS — M25552 Pain in left hip: Secondary | ICD-10-CM | POA: Diagnosis not present

## 2021-07-16 DIAGNOSIS — M6281 Muscle weakness (generalized): Secondary | ICD-10-CM | POA: Diagnosis not present

## 2021-07-16 DIAGNOSIS — M545 Low back pain, unspecified: Secondary | ICD-10-CM | POA: Diagnosis not present

## 2021-07-21 DIAGNOSIS — L570 Actinic keratosis: Secondary | ICD-10-CM | POA: Diagnosis not present

## 2021-07-21 DIAGNOSIS — L821 Other seborrheic keratosis: Secondary | ICD-10-CM | POA: Diagnosis not present

## 2021-07-21 DIAGNOSIS — L245 Irritant contact dermatitis due to other chemical products: Secondary | ICD-10-CM | POA: Diagnosis not present

## 2021-07-31 DIAGNOSIS — G5602 Carpal tunnel syndrome, left upper limb: Secondary | ICD-10-CM | POA: Diagnosis not present

## 2021-08-02 DIAGNOSIS — J439 Emphysema, unspecified: Secondary | ICD-10-CM | POA: Diagnosis not present

## 2021-08-02 DIAGNOSIS — I700 Atherosclerosis of aorta: Secondary | ICD-10-CM | POA: Diagnosis not present

## 2021-08-11 DIAGNOSIS — M1711 Unilateral primary osteoarthritis, right knee: Secondary | ICD-10-CM | POA: Diagnosis not present

## 2021-08-12 DIAGNOSIS — Z1231 Encounter for screening mammogram for malignant neoplasm of breast: Secondary | ICD-10-CM | POA: Diagnosis not present

## 2021-08-12 DIAGNOSIS — Z01419 Encounter for gynecological examination (general) (routine) without abnormal findings: Secondary | ICD-10-CM | POA: Diagnosis not present

## 2021-08-12 DIAGNOSIS — Z139 Encounter for screening, unspecified: Secondary | ICD-10-CM | POA: Diagnosis not present

## 2021-08-12 DIAGNOSIS — K130 Diseases of lips: Secondary | ICD-10-CM | POA: Diagnosis not present

## 2021-08-12 DIAGNOSIS — H353131 Nonexudative age-related macular degeneration, bilateral, early dry stage: Secondary | ICD-10-CM | POA: Diagnosis not present

## 2021-08-12 DIAGNOSIS — Z6829 Body mass index (BMI) 29.0-29.9, adult: Secondary | ICD-10-CM | POA: Diagnosis not present

## 2021-08-12 DIAGNOSIS — R300 Dysuria: Secondary | ICD-10-CM | POA: Diagnosis not present

## 2021-08-12 DIAGNOSIS — I10 Essential (primary) hypertension: Secondary | ICD-10-CM | POA: Diagnosis not present

## 2021-08-13 DIAGNOSIS — K130 Diseases of lips: Secondary | ICD-10-CM | POA: Diagnosis not present

## 2021-08-16 DIAGNOSIS — E278 Other specified disorders of adrenal gland: Secondary | ICD-10-CM | POA: Diagnosis not present

## 2021-08-16 DIAGNOSIS — M545 Low back pain, unspecified: Secondary | ICD-10-CM | POA: Diagnosis not present

## 2021-08-16 DIAGNOSIS — Z043 Encounter for examination and observation following other accident: Secondary | ICD-10-CM | POA: Diagnosis not present

## 2021-08-16 DIAGNOSIS — M25531 Pain in right wrist: Secondary | ICD-10-CM | POA: Diagnosis not present

## 2021-08-16 DIAGNOSIS — S63501A Unspecified sprain of right wrist, initial encounter: Secondary | ICD-10-CM | POA: Diagnosis not present

## 2021-08-16 DIAGNOSIS — M25512 Pain in left shoulder: Secondary | ICD-10-CM | POA: Diagnosis not present

## 2021-08-16 DIAGNOSIS — K5900 Constipation, unspecified: Secondary | ICD-10-CM | POA: Diagnosis not present

## 2021-08-16 DIAGNOSIS — S93401A Sprain of unspecified ligament of right ankle, initial encounter: Secondary | ICD-10-CM | POA: Diagnosis not present

## 2021-08-16 DIAGNOSIS — M533 Sacrococcygeal disorders, not elsewhere classified: Secondary | ICD-10-CM | POA: Diagnosis not present

## 2021-08-16 DIAGNOSIS — S46912A Strain of unspecified muscle, fascia and tendon at shoulder and upper arm level, left arm, initial encounter: Secondary | ICD-10-CM | POA: Diagnosis not present

## 2021-08-16 DIAGNOSIS — M25571 Pain in right ankle and joints of right foot: Secondary | ICD-10-CM | POA: Diagnosis not present

## 2021-08-16 DIAGNOSIS — I700 Atherosclerosis of aorta: Secondary | ICD-10-CM | POA: Diagnosis not present

## 2021-08-17 DIAGNOSIS — S93401A Sprain of unspecified ligament of right ankle, initial encounter: Secondary | ICD-10-CM | POA: Diagnosis not present

## 2021-08-17 DIAGNOSIS — S63502A Unspecified sprain of left wrist, initial encounter: Secondary | ICD-10-CM | POA: Diagnosis not present

## 2021-08-17 DIAGNOSIS — S93402A Sprain of unspecified ligament of left ankle, initial encounter: Secondary | ICD-10-CM | POA: Diagnosis not present

## 2021-08-17 DIAGNOSIS — S63501A Unspecified sprain of right wrist, initial encounter: Secondary | ICD-10-CM | POA: Diagnosis not present

## 2021-08-18 DIAGNOSIS — W19XXXS Unspecified fall, sequela: Secondary | ICD-10-CM | POA: Diagnosis not present

## 2021-08-18 DIAGNOSIS — Z7689 Persons encountering health services in other specified circumstances: Secondary | ICD-10-CM | POA: Diagnosis not present

## 2021-08-18 DIAGNOSIS — R1084 Generalized abdominal pain: Secondary | ICD-10-CM | POA: Diagnosis not present

## 2021-08-18 DIAGNOSIS — Z6832 Body mass index (BMI) 32.0-32.9, adult: Secondary | ICD-10-CM | POA: Diagnosis not present

## 2021-08-18 DIAGNOSIS — M25511 Pain in right shoulder: Secondary | ICD-10-CM | POA: Diagnosis not present

## 2021-08-18 DIAGNOSIS — Z9049 Acquired absence of other specified parts of digestive tract: Secondary | ICD-10-CM | POA: Diagnosis not present

## 2021-08-26 DIAGNOSIS — M1712 Unilateral primary osteoarthritis, left knee: Secondary | ICD-10-CM | POA: Diagnosis not present

## 2021-08-28 ENCOUNTER — Encounter: Payer: Self-pay | Admitting: Cardiology

## 2021-08-28 DIAGNOSIS — E559 Vitamin D deficiency, unspecified: Secondary | ICD-10-CM | POA: Diagnosis not present

## 2021-08-28 DIAGNOSIS — Z01818 Encounter for other preprocedural examination: Secondary | ICD-10-CM | POA: Diagnosis not present

## 2021-08-28 DIAGNOSIS — Z79899 Other long term (current) drug therapy: Secondary | ICD-10-CM | POA: Diagnosis not present

## 2021-08-28 DIAGNOSIS — M79609 Pain in unspecified limb: Secondary | ICD-10-CM | POA: Diagnosis not present

## 2021-08-29 DIAGNOSIS — G8929 Other chronic pain: Secondary | ICD-10-CM | POA: Diagnosis not present

## 2021-08-29 DIAGNOSIS — M5442 Lumbago with sciatica, left side: Secondary | ICD-10-CM | POA: Diagnosis not present

## 2021-09-01 DIAGNOSIS — I10 Essential (primary) hypertension: Secondary | ICD-10-CM | POA: Diagnosis not present

## 2021-09-02 DIAGNOSIS — I700 Atherosclerosis of aorta: Secondary | ICD-10-CM | POA: Diagnosis not present

## 2021-09-02 DIAGNOSIS — I10 Essential (primary) hypertension: Secondary | ICD-10-CM | POA: Diagnosis not present

## 2021-09-09 DIAGNOSIS — M545 Low back pain, unspecified: Secondary | ICD-10-CM | POA: Diagnosis not present

## 2021-09-09 DIAGNOSIS — M48061 Spinal stenosis, lumbar region without neurogenic claudication: Secondary | ICD-10-CM | POA: Diagnosis not present

## 2021-09-09 DIAGNOSIS — M5416 Radiculopathy, lumbar region: Secondary | ICD-10-CM | POA: Diagnosis not present

## 2021-09-12 DIAGNOSIS — M5416 Radiculopathy, lumbar region: Secondary | ICD-10-CM | POA: Diagnosis not present

## 2021-09-15 DIAGNOSIS — R87810 Cervical high risk human papillomavirus (HPV) DNA test positive: Secondary | ICD-10-CM | POA: Diagnosis not present

## 2021-09-15 DIAGNOSIS — R87610 Atypical squamous cells of undetermined significance on cytologic smear of cervix (ASC-US): Secondary | ICD-10-CM | POA: Diagnosis not present

## 2021-09-15 DIAGNOSIS — N72 Inflammatory disease of cervix uteri: Secondary | ICD-10-CM | POA: Diagnosis not present

## 2021-09-15 DIAGNOSIS — N888 Other specified noninflammatory disorders of cervix uteri: Secondary | ICD-10-CM | POA: Diagnosis not present

## 2021-09-22 DIAGNOSIS — L821 Other seborrheic keratosis: Secondary | ICD-10-CM | POA: Diagnosis not present

## 2021-09-22 DIAGNOSIS — D225 Melanocytic nevi of trunk: Secondary | ICD-10-CM | POA: Diagnosis not present

## 2021-09-22 DIAGNOSIS — L02413 Cutaneous abscess of right upper limb: Secondary | ICD-10-CM | POA: Diagnosis not present

## 2021-09-22 DIAGNOSIS — L570 Actinic keratosis: Secondary | ICD-10-CM | POA: Diagnosis not present

## 2021-09-22 DIAGNOSIS — D2239 Melanocytic nevi of other parts of face: Secondary | ICD-10-CM | POA: Diagnosis not present

## 2021-09-23 DIAGNOSIS — Z6833 Body mass index (BMI) 33.0-33.9, adult: Secondary | ICD-10-CM | POA: Diagnosis not present

## 2021-09-23 DIAGNOSIS — J9610 Chronic respiratory failure, unspecified whether with hypoxia or hypercapnia: Secondary | ICD-10-CM | POA: Diagnosis not present

## 2021-09-23 DIAGNOSIS — J439 Emphysema, unspecified: Secondary | ICD-10-CM | POA: Diagnosis not present

## 2021-09-23 DIAGNOSIS — Z9981 Dependence on supplemental oxygen: Secondary | ICD-10-CM | POA: Diagnosis not present

## 2021-09-24 DIAGNOSIS — M47817 Spondylosis without myelopathy or radiculopathy, lumbosacral region: Secondary | ICD-10-CM | POA: Diagnosis not present

## 2021-09-24 DIAGNOSIS — M5416 Radiculopathy, lumbar region: Secondary | ICD-10-CM | POA: Diagnosis not present

## 2021-09-24 DIAGNOSIS — M5136 Other intervertebral disc degeneration, lumbar region: Secondary | ICD-10-CM | POA: Diagnosis not present

## 2021-10-02 DIAGNOSIS — J439 Emphysema, unspecified: Secondary | ICD-10-CM | POA: Diagnosis not present

## 2021-10-02 DIAGNOSIS — I10 Essential (primary) hypertension: Secondary | ICD-10-CM | POA: Diagnosis not present

## 2021-10-03 DIAGNOSIS — I10 Essential (primary) hypertension: Secondary | ICD-10-CM | POA: Diagnosis not present

## 2021-10-03 DIAGNOSIS — I700 Atherosclerosis of aorta: Secondary | ICD-10-CM | POA: Diagnosis not present

## 2021-10-07 DIAGNOSIS — J439 Emphysema, unspecified: Secondary | ICD-10-CM | POA: Diagnosis not present

## 2021-10-29 DIAGNOSIS — R062 Wheezing: Secondary | ICD-10-CM | POA: Diagnosis not present

## 2021-10-29 DIAGNOSIS — Z20822 Contact with and (suspected) exposure to covid-19: Secondary | ICD-10-CM | POA: Diagnosis not present

## 2021-10-29 DIAGNOSIS — H109 Unspecified conjunctivitis: Secondary | ICD-10-CM | POA: Diagnosis not present

## 2021-10-29 DIAGNOSIS — J449 Chronic obstructive pulmonary disease, unspecified: Secondary | ICD-10-CM | POA: Diagnosis not present

## 2021-10-29 DIAGNOSIS — R059 Cough, unspecified: Secondary | ICD-10-CM | POA: Diagnosis not present

## 2021-10-29 DIAGNOSIS — Z6833 Body mass index (BMI) 33.0-33.9, adult: Secondary | ICD-10-CM | POA: Diagnosis not present

## 2021-11-01 DIAGNOSIS — J439 Emphysema, unspecified: Secondary | ICD-10-CM | POA: Diagnosis not present

## 2021-11-01 DIAGNOSIS — I10 Essential (primary) hypertension: Secondary | ICD-10-CM | POA: Diagnosis not present

## 2021-11-02 DIAGNOSIS — I700 Atherosclerosis of aorta: Secondary | ICD-10-CM | POA: Diagnosis not present

## 2021-11-02 DIAGNOSIS — I10 Essential (primary) hypertension: Secondary | ICD-10-CM | POA: Diagnosis not present

## 2021-11-04 DIAGNOSIS — I10 Essential (primary) hypertension: Secondary | ICD-10-CM | POA: Diagnosis not present

## 2021-11-04 DIAGNOSIS — E785 Hyperlipidemia, unspecified: Secondary | ICD-10-CM | POA: Diagnosis not present

## 2021-11-06 DIAGNOSIS — M79609 Pain in unspecified limb: Secondary | ICD-10-CM | POA: Diagnosis not present

## 2021-11-06 DIAGNOSIS — Z79899 Other long term (current) drug therapy: Secondary | ICD-10-CM | POA: Diagnosis not present

## 2021-11-06 DIAGNOSIS — R52 Pain, unspecified: Secondary | ICD-10-CM | POA: Diagnosis not present

## 2021-11-13 DIAGNOSIS — J449 Chronic obstructive pulmonary disease, unspecified: Secondary | ICD-10-CM | POA: Diagnosis not present

## 2021-11-13 DIAGNOSIS — J9610 Chronic respiratory failure, unspecified whether with hypoxia or hypercapnia: Secondary | ICD-10-CM | POA: Diagnosis not present

## 2021-11-13 DIAGNOSIS — Z23 Encounter for immunization: Secondary | ICD-10-CM | POA: Diagnosis not present

## 2021-11-13 DIAGNOSIS — Z6834 Body mass index (BMI) 34.0-34.9, adult: Secondary | ICD-10-CM | POA: Diagnosis not present

## 2021-11-17 DIAGNOSIS — Z6834 Body mass index (BMI) 34.0-34.9, adult: Secondary | ICD-10-CM | POA: Diagnosis not present

## 2021-11-17 DIAGNOSIS — R0600 Dyspnea, unspecified: Secondary | ICD-10-CM | POA: Diagnosis not present

## 2021-11-17 DIAGNOSIS — R0609 Other forms of dyspnea: Secondary | ICD-10-CM | POA: Diagnosis not present

## 2021-11-17 DIAGNOSIS — R0689 Other abnormalities of breathing: Secondary | ICD-10-CM | POA: Diagnosis not present

## 2021-11-17 DIAGNOSIS — R600 Localized edema: Secondary | ICD-10-CM | POA: Diagnosis not present

## 2021-11-17 DIAGNOSIS — J9610 Chronic respiratory failure, unspecified whether with hypoxia or hypercapnia: Secondary | ICD-10-CM | POA: Diagnosis not present

## 2021-11-17 DIAGNOSIS — J439 Emphysema, unspecified: Secondary | ICD-10-CM | POA: Diagnosis not present

## 2021-11-18 DIAGNOSIS — M7989 Other specified soft tissue disorders: Secondary | ICD-10-CM | POA: Diagnosis not present

## 2021-11-18 DIAGNOSIS — M79661 Pain in right lower leg: Secondary | ICD-10-CM | POA: Diagnosis not present

## 2021-11-18 DIAGNOSIS — R0902 Hypoxemia: Secondary | ICD-10-CM | POA: Diagnosis not present

## 2021-11-19 ENCOUNTER — Telehealth: Payer: Self-pay

## 2021-11-19 ENCOUNTER — Other Ambulatory Visit: Payer: Self-pay

## 2021-11-19 DIAGNOSIS — J439 Emphysema, unspecified: Secondary | ICD-10-CM | POA: Diagnosis not present

## 2021-11-19 DIAGNOSIS — R0600 Dyspnea, unspecified: Secondary | ICD-10-CM | POA: Diagnosis not present

## 2021-11-19 DIAGNOSIS — R7989 Other specified abnormal findings of blood chemistry: Secondary | ICD-10-CM | POA: Diagnosis not present

## 2021-11-19 DIAGNOSIS — J9610 Chronic respiratory failure, unspecified whether with hypoxia or hypercapnia: Secondary | ICD-10-CM

## 2021-11-19 NOTE — Telephone Encounter (Signed)
   Telephone encounter was:  Successful.  11/19/2021 Name: Tracy Oconnor MRN: [id number] DOB: 1960-05-08  Tracy Oconnor is a 61 y.o. year old female who is a primary care patient of Marco Collie, MD . The community resource team was consulted for assistance with Transportation Needs   Care guide performed the following interventions: Patient provided with information about care guide support team and interviewed to confirm resource needs.Patient has no transportation needs at this time and I gave resources over the phone. I will mail resources to the patient  Follow Up Plan:  Care guide will follow up with patient by phone over the next day    [place], [place] [facility]  [phone] [address], [place], [place] [address] Phone: [phone] Email: [email or web address]

## 2021-11-21 ENCOUNTER — Telehealth: Payer: Self-pay

## 2021-11-21 DIAGNOSIS — M1711 Unilateral primary osteoarthritis, right knee: Secondary | ICD-10-CM | POA: Diagnosis not present

## 2021-11-21 DIAGNOSIS — M25571 Pain in right ankle and joints of right foot: Secondary | ICD-10-CM | POA: Diagnosis not present

## 2021-11-21 DIAGNOSIS — M7989 Other specified soft tissue disorders: Secondary | ICD-10-CM | POA: Diagnosis not present

## 2021-11-21 NOTE — Telephone Encounter (Signed)
  Mailing transportation [place], [place] [facility]  [phone] [address], [place], [place] [address] Phone: [phone] Email: [email or web address]

## 2021-11-25 DIAGNOSIS — J9610 Chronic respiratory failure, unspecified whether with hypoxia or hypercapnia: Secondary | ICD-10-CM | POA: Diagnosis not present

## 2021-11-25 DIAGNOSIS — Z6834 Body mass index (BMI) 34.0-34.9, adult: Secondary | ICD-10-CM | POA: Diagnosis not present

## 2021-11-25 DIAGNOSIS — Z23 Encounter for immunization: Secondary | ICD-10-CM | POA: Diagnosis not present

## 2021-11-25 DIAGNOSIS — R600 Localized edema: Secondary | ICD-10-CM | POA: Diagnosis not present

## 2021-12-02 DIAGNOSIS — J439 Emphysema, unspecified: Secondary | ICD-10-CM | POA: Diagnosis not present

## 2021-12-02 DIAGNOSIS — I10 Essential (primary) hypertension: Secondary | ICD-10-CM | POA: Diagnosis not present

## 2021-12-03 DIAGNOSIS — I10 Essential (primary) hypertension: Secondary | ICD-10-CM | POA: Diagnosis not present

## 2021-12-03 DIAGNOSIS — I700 Atherosclerosis of aorta: Secondary | ICD-10-CM | POA: Diagnosis not present

## 2021-12-09 DIAGNOSIS — R600 Localized edema: Secondary | ICD-10-CM | POA: Diagnosis not present

## 2021-12-09 DIAGNOSIS — Z6835 Body mass index (BMI) 35.0-35.9, adult: Secondary | ICD-10-CM | POA: Diagnosis not present

## 2021-12-12 DIAGNOSIS — R600 Localized edema: Secondary | ICD-10-CM | POA: Diagnosis not present

## 2021-12-17 DIAGNOSIS — I10 Essential (primary) hypertension: Secondary | ICD-10-CM | POA: Diagnosis not present

## 2021-12-17 DIAGNOSIS — G4719 Other hypersomnia: Secondary | ICD-10-CM | POA: Diagnosis not present

## 2021-12-17 DIAGNOSIS — Z6835 Body mass index (BMI) 35.0-35.9, adult: Secondary | ICD-10-CM | POA: Diagnosis not present

## 2021-12-17 DIAGNOSIS — R600 Localized edema: Secondary | ICD-10-CM | POA: Diagnosis not present

## 2021-12-19 DIAGNOSIS — R5383 Other fatigue: Secondary | ICD-10-CM | POA: Diagnosis not present

## 2021-12-19 DIAGNOSIS — G4733 Obstructive sleep apnea (adult) (pediatric): Secondary | ICD-10-CM | POA: Diagnosis not present

## 2021-12-19 DIAGNOSIS — R400 Somnolence: Secondary | ICD-10-CM | POA: Diagnosis not present

## 2021-12-19 DIAGNOSIS — J449 Chronic obstructive pulmonary disease, unspecified: Secondary | ICD-10-CM | POA: Diagnosis not present

## 2021-12-19 DIAGNOSIS — I2510 Atherosclerotic heart disease of native coronary artery without angina pectoris: Secondary | ICD-10-CM | POA: Diagnosis not present

## 2021-12-19 DIAGNOSIS — Z87891 Personal history of nicotine dependence: Secondary | ICD-10-CM | POA: Diagnosis not present

## 2022-01-02 DIAGNOSIS — Z6835 Body mass index (BMI) 35.0-35.9, adult: Secondary | ICD-10-CM | POA: Diagnosis not present

## 2022-01-02 DIAGNOSIS — L602 Onychogryphosis: Secondary | ICD-10-CM | POA: Diagnosis not present

## 2022-01-02 DIAGNOSIS — J449 Chronic obstructive pulmonary disease, unspecified: Secondary | ICD-10-CM | POA: Diagnosis not present

## 2022-01-02 DIAGNOSIS — I10 Essential (primary) hypertension: Secondary | ICD-10-CM | POA: Diagnosis not present

## 2022-01-02 DIAGNOSIS — R600 Localized edema: Secondary | ICD-10-CM | POA: Diagnosis not present

## 2022-01-05 DIAGNOSIS — I2510 Atherosclerotic heart disease of native coronary artery without angina pectoris: Secondary | ICD-10-CM | POA: Diagnosis not present

## 2022-01-05 DIAGNOSIS — R400 Somnolence: Secondary | ICD-10-CM | POA: Diagnosis not present

## 2022-01-05 DIAGNOSIS — J449 Chronic obstructive pulmonary disease, unspecified: Secondary | ICD-10-CM | POA: Diagnosis not present

## 2022-01-05 DIAGNOSIS — Z87891 Personal history of nicotine dependence: Secondary | ICD-10-CM | POA: Diagnosis not present

## 2022-01-05 DIAGNOSIS — R5383 Other fatigue: Secondary | ICD-10-CM | POA: Diagnosis not present

## 2022-01-05 DIAGNOSIS — G4733 Obstructive sleep apnea (adult) (pediatric): Secondary | ICD-10-CM | POA: Diagnosis not present

## 2022-01-05 DIAGNOSIS — R051 Acute cough: Secondary | ICD-10-CM | POA: Diagnosis not present

## 2022-01-09 DIAGNOSIS — M1711 Unilateral primary osteoarthritis, right knee: Secondary | ICD-10-CM | POA: Diagnosis not present

## 2022-01-19 DIAGNOSIS — I2510 Atherosclerotic heart disease of native coronary artery without angina pectoris: Secondary | ICD-10-CM | POA: Diagnosis not present

## 2022-01-29 DIAGNOSIS — Z6835 Body mass index (BMI) 35.0-35.9, adult: Secondary | ICD-10-CM | POA: Diagnosis not present

## 2022-01-29 DIAGNOSIS — J020 Streptococcal pharyngitis: Secondary | ICD-10-CM | POA: Diagnosis not present

## 2022-01-29 DIAGNOSIS — R059 Cough, unspecified: Secondary | ICD-10-CM | POA: Diagnosis not present

## 2022-01-29 DIAGNOSIS — Z139 Encounter for screening, unspecified: Secondary | ICD-10-CM | POA: Diagnosis not present

## 2022-01-29 DIAGNOSIS — Z20822 Contact with and (suspected) exposure to covid-19: Secondary | ICD-10-CM | POA: Diagnosis not present

## 2022-02-02 DIAGNOSIS — I10 Essential (primary) hypertension: Secondary | ICD-10-CM | POA: Diagnosis not present

## 2022-02-02 DIAGNOSIS — J449 Chronic obstructive pulmonary disease, unspecified: Secondary | ICD-10-CM | POA: Diagnosis not present

## 2022-02-09 DIAGNOSIS — I872 Venous insufficiency (chronic) (peripheral): Secondary | ICD-10-CM | POA: Diagnosis not present

## 2022-02-09 DIAGNOSIS — L603 Nail dystrophy: Secondary | ICD-10-CM | POA: Diagnosis not present

## 2022-02-09 DIAGNOSIS — L602 Onychogryphosis: Secondary | ICD-10-CM | POA: Diagnosis not present

## 2022-02-09 DIAGNOSIS — R600 Localized edema: Secondary | ICD-10-CM | POA: Diagnosis not present

## 2022-02-20 DIAGNOSIS — M5416 Radiculopathy, lumbar region: Secondary | ICD-10-CM | POA: Diagnosis not present

## 2022-02-24 DIAGNOSIS — M1712 Unilateral primary osteoarthritis, left knee: Secondary | ICD-10-CM | POA: Diagnosis not present

## 2022-03-04 DIAGNOSIS — G4733 Obstructive sleep apnea (adult) (pediatric): Secondary | ICD-10-CM | POA: Diagnosis not present

## 2022-03-05 DIAGNOSIS — I10 Essential (primary) hypertension: Secondary | ICD-10-CM | POA: Diagnosis not present

## 2022-03-05 DIAGNOSIS — J449 Chronic obstructive pulmonary disease, unspecified: Secondary | ICD-10-CM | POA: Diagnosis not present

## 2022-03-06 DIAGNOSIS — M5416 Radiculopathy, lumbar region: Secondary | ICD-10-CM | POA: Diagnosis not present

## 2022-03-24 DIAGNOSIS — R400 Somnolence: Secondary | ICD-10-CM | POA: Diagnosis not present

## 2022-03-24 DIAGNOSIS — J449 Chronic obstructive pulmonary disease, unspecified: Secondary | ICD-10-CM | POA: Diagnosis not present

## 2022-03-24 DIAGNOSIS — I2510 Atherosclerotic heart disease of native coronary artery without angina pectoris: Secondary | ICD-10-CM | POA: Diagnosis not present

## 2022-03-24 DIAGNOSIS — R5383 Other fatigue: Secondary | ICD-10-CM | POA: Diagnosis not present

## 2022-03-24 DIAGNOSIS — Z87891 Personal history of nicotine dependence: Secondary | ICD-10-CM | POA: Diagnosis not present

## 2022-03-24 DIAGNOSIS — G4733 Obstructive sleep apnea (adult) (pediatric): Secondary | ICD-10-CM | POA: Diagnosis not present

## 2022-03-24 DIAGNOSIS — G4736 Sleep related hypoventilation in conditions classified elsewhere: Secondary | ICD-10-CM | POA: Diagnosis not present

## 2022-03-26 DIAGNOSIS — M5416 Radiculopathy, lumbar region: Secondary | ICD-10-CM | POA: Diagnosis not present

## 2022-04-03 DIAGNOSIS — I10 Essential (primary) hypertension: Secondary | ICD-10-CM | POA: Diagnosis not present

## 2022-04-03 DIAGNOSIS — J449 Chronic obstructive pulmonary disease, unspecified: Secondary | ICD-10-CM | POA: Diagnosis not present

## 2022-04-09 DIAGNOSIS — M5136 Other intervertebral disc degeneration, lumbar region: Secondary | ICD-10-CM | POA: Diagnosis not present

## 2022-04-09 DIAGNOSIS — M545 Low back pain, unspecified: Secondary | ICD-10-CM | POA: Diagnosis not present

## 2022-04-09 DIAGNOSIS — M533 Sacrococcygeal disorders, not elsewhere classified: Secondary | ICD-10-CM | POA: Diagnosis not present

## 2022-04-09 DIAGNOSIS — M47817 Spondylosis without myelopathy or radiculopathy, lumbosacral region: Secondary | ICD-10-CM | POA: Diagnosis not present

## 2022-04-09 DIAGNOSIS — G8929 Other chronic pain: Secondary | ICD-10-CM | POA: Diagnosis not present

## 2022-04-09 DIAGNOSIS — M5451 Vertebrogenic low back pain: Secondary | ICD-10-CM | POA: Diagnosis not present

## 2022-04-20 DIAGNOSIS — W19XXXA Unspecified fall, initial encounter: Secondary | ICD-10-CM | POA: Diagnosis not present

## 2022-04-20 DIAGNOSIS — S80211A Abrasion, right knee, initial encounter: Secondary | ICD-10-CM | POA: Diagnosis not present

## 2022-04-20 DIAGNOSIS — S0990XA Unspecified injury of head, initial encounter: Secondary | ICD-10-CM | POA: Diagnosis not present

## 2022-04-20 DIAGNOSIS — M19011 Primary osteoarthritis, right shoulder: Secondary | ICD-10-CM | POA: Diagnosis not present

## 2022-04-20 DIAGNOSIS — I10 Essential (primary) hypertension: Secondary | ICD-10-CM | POA: Diagnosis not present

## 2022-04-20 DIAGNOSIS — Z043 Encounter for examination and observation following other accident: Secondary | ICD-10-CM | POA: Diagnosis not present

## 2022-04-20 DIAGNOSIS — E785 Hyperlipidemia, unspecified: Secondary | ICD-10-CM | POA: Diagnosis not present

## 2022-04-20 DIAGNOSIS — S199XXA Unspecified injury of neck, initial encounter: Secondary | ICD-10-CM | POA: Diagnosis not present

## 2022-04-27 ENCOUNTER — Telehealth: Payer: Self-pay

## 2022-04-27 NOTE — Telephone Encounter (Signed)
     Patient  visit on 3/18  at [facility]   Have you been able to follow up with your primary care physician? No   The patient was or was not able to obtain any needed medicine or equipment. Yes   Are there diet recommendations that you are having difficulty following? Na   Patient expresses understanding of discharge instructions and education provided has no other needs at this time.  Yes    [facility] [phone] [address], [place], [place] [address] Phone: [phone] Email: [email or web address]

## 2022-05-01 DIAGNOSIS — J449 Chronic obstructive pulmonary disease, unspecified: Secondary | ICD-10-CM | POA: Diagnosis not present

## 2022-05-01 DIAGNOSIS — R918 Other nonspecific abnormal finding of lung field: Secondary | ICD-10-CM | POA: Diagnosis not present

## 2022-05-01 DIAGNOSIS — M545 Low back pain, unspecified: Secondary | ICD-10-CM | POA: Diagnosis not present

## 2022-05-01 DIAGNOSIS — W19XXXA Unspecified fall, initial encounter: Secondary | ICD-10-CM | POA: Diagnosis not present

## 2022-05-01 DIAGNOSIS — M25559 Pain in unspecified hip: Secondary | ICD-10-CM | POA: Diagnosis not present

## 2022-05-01 DIAGNOSIS — M5416 Radiculopathy, lumbar region: Secondary | ICD-10-CM | POA: Diagnosis not present

## 2022-05-01 DIAGNOSIS — M5137 Other intervertebral disc degeneration, lumbosacral region: Secondary | ICD-10-CM | POA: Diagnosis not present

## 2022-05-01 DIAGNOSIS — S20211A Contusion of right front wall of thorax, initial encounter: Secondary | ICD-10-CM | POA: Diagnosis not present

## 2022-05-01 DIAGNOSIS — Z9981 Dependence on supplemental oxygen: Secondary | ICD-10-CM | POA: Diagnosis not present

## 2022-05-01 DIAGNOSIS — J439 Emphysema, unspecified: Secondary | ICD-10-CM | POA: Diagnosis not present

## 2022-05-04 DIAGNOSIS — J449 Chronic obstructive pulmonary disease, unspecified: Secondary | ICD-10-CM | POA: Diagnosis not present

## 2022-05-04 DIAGNOSIS — I10 Essential (primary) hypertension: Secondary | ICD-10-CM | POA: Diagnosis not present

## 2022-05-06 ENCOUNTER — Telehealth: Payer: Self-pay

## 2022-05-06 NOTE — Telephone Encounter (Signed)
     Patient  visit on 3/29  at [facility]   Have you been able to follow up with your primary care physician? Yes   The patient was or was not able to obtain any needed medicine or equipment. Yes   Are there diet recommendations that you are having difficulty following? Na   Patient expresses understanding of discharge instructions and education provided has no other needs at this time.  Yes      [facility] [phone] [address], [place], [place] [address] Phone: [phone] Email: [email or web address]

## 2022-05-07 DIAGNOSIS — M5416 Radiculopathy, lumbar region: Secondary | ICD-10-CM | POA: Diagnosis not present

## 2022-05-07 DIAGNOSIS — Z6834 Body mass index (BMI) 34.0-34.9, adult: Secondary | ICD-10-CM | POA: Diagnosis not present

## 2022-05-07 DIAGNOSIS — E785 Hyperlipidemia, unspecified: Secondary | ICD-10-CM | POA: Diagnosis not present

## 2022-05-07 DIAGNOSIS — J9610 Chronic respiratory failure, unspecified whether with hypoxia or hypercapnia: Secondary | ICD-10-CM | POA: Diagnosis not present

## 2022-05-07 DIAGNOSIS — F319 Bipolar disorder, unspecified: Secondary | ICD-10-CM | POA: Diagnosis not present

## 2022-05-07 DIAGNOSIS — I700 Atherosclerosis of aorta: Secondary | ICD-10-CM | POA: Diagnosis not present

## 2022-05-07 DIAGNOSIS — M79642 Pain in left hand: Secondary | ICD-10-CM | POA: Diagnosis not present

## 2022-05-07 DIAGNOSIS — I10 Essential (primary) hypertension: Secondary | ICD-10-CM | POA: Diagnosis not present

## 2022-05-11 DIAGNOSIS — M5416 Radiculopathy, lumbar region: Secondary | ICD-10-CM | POA: Diagnosis not present

## 2022-05-18 DIAGNOSIS — M533 Sacrococcygeal disorders, not elsewhere classified: Secondary | ICD-10-CM | POA: Diagnosis not present

## 2022-05-26 DIAGNOSIS — M1711 Unilateral primary osteoarthritis, right knee: Secondary | ICD-10-CM | POA: Diagnosis not present

## 2022-05-26 DIAGNOSIS — M1712 Unilateral primary osteoarthritis, left knee: Secondary | ICD-10-CM | POA: Diagnosis not present

## 2022-06-03 DIAGNOSIS — I10 Essential (primary) hypertension: Secondary | ICD-10-CM | POA: Diagnosis not present

## 2022-06-03 DIAGNOSIS — J449 Chronic obstructive pulmonary disease, unspecified: Secondary | ICD-10-CM | POA: Diagnosis not present

## 2022-06-08 DIAGNOSIS — M5416 Radiculopathy, lumbar region: Secondary | ICD-10-CM | POA: Diagnosis not present

## 2022-06-08 DIAGNOSIS — G8929 Other chronic pain: Secondary | ICD-10-CM | POA: Diagnosis not present

## 2022-06-08 DIAGNOSIS — M48061 Spinal stenosis, lumbar region without neurogenic claudication: Secondary | ICD-10-CM | POA: Diagnosis not present

## 2022-06-10 DIAGNOSIS — Z139 Encounter for screening, unspecified: Secondary | ICD-10-CM | POA: Diagnosis not present

## 2022-06-10 DIAGNOSIS — Z136 Encounter for screening for cardiovascular disorders: Secondary | ICD-10-CM | POA: Diagnosis not present

## 2022-06-10 DIAGNOSIS — R5383 Other fatigue: Secondary | ICD-10-CM | POA: Diagnosis not present

## 2022-06-10 DIAGNOSIS — Z1389 Encounter for screening for other disorder: Secondary | ICD-10-CM | POA: Diagnosis not present

## 2022-06-10 DIAGNOSIS — G4736 Sleep related hypoventilation in conditions classified elsewhere: Secondary | ICD-10-CM | POA: Diagnosis not present

## 2022-06-10 DIAGNOSIS — Z87891 Personal history of nicotine dependence: Secondary | ICD-10-CM | POA: Diagnosis not present

## 2022-06-10 DIAGNOSIS — G4733 Obstructive sleep apnea (adult) (pediatric): Secondary | ICD-10-CM | POA: Diagnosis not present

## 2022-06-10 DIAGNOSIS — Z0189 Encounter for other specified special examinations: Secondary | ICD-10-CM | POA: Diagnosis not present

## 2022-06-10 DIAGNOSIS — R400 Somnolence: Secondary | ICD-10-CM | POA: Diagnosis not present

## 2022-06-10 DIAGNOSIS — J449 Chronic obstructive pulmonary disease, unspecified: Secondary | ICD-10-CM | POA: Diagnosis not present

## 2022-06-10 DIAGNOSIS — Z1331 Encounter for screening for depression: Secondary | ICD-10-CM | POA: Diagnosis not present

## 2022-06-10 DIAGNOSIS — Z1339 Encounter for screening examination for other mental health and behavioral disorders: Secondary | ICD-10-CM | POA: Diagnosis not present

## 2022-06-10 DIAGNOSIS — I2510 Atherosclerotic heart disease of native coronary artery without angina pectoris: Secondary | ICD-10-CM | POA: Diagnosis not present

## 2022-06-10 DIAGNOSIS — Z0000 Encounter for general adult medical examination without abnormal findings: Secondary | ICD-10-CM | POA: Diagnosis not present

## 2022-06-23 ENCOUNTER — Other Ambulatory Visit: Payer: Self-pay

## 2022-06-23 DIAGNOSIS — J449 Chronic obstructive pulmonary disease, unspecified: Secondary | ICD-10-CM | POA: Insufficient documentation

## 2022-06-23 DIAGNOSIS — I10 Essential (primary) hypertension: Secondary | ICD-10-CM | POA: Insufficient documentation

## 2022-06-23 DIAGNOSIS — R609 Edema, unspecified: Secondary | ICD-10-CM | POA: Insufficient documentation

## 2022-06-23 DIAGNOSIS — F319 Bipolar disorder, unspecified: Secondary | ICD-10-CM | POA: Insufficient documentation

## 2022-06-23 DIAGNOSIS — R002 Palpitations: Secondary | ICD-10-CM | POA: Insufficient documentation

## 2022-06-23 DIAGNOSIS — I7090 Unspecified atherosclerosis: Secondary | ICD-10-CM | POA: Insufficient documentation

## 2022-06-23 DIAGNOSIS — R5383 Other fatigue: Secondary | ICD-10-CM | POA: Insufficient documentation

## 2022-06-23 DIAGNOSIS — E785 Hyperlipidemia, unspecified: Secondary | ICD-10-CM | POA: Insufficient documentation

## 2022-06-23 DIAGNOSIS — G4733 Obstructive sleep apnea (adult) (pediatric): Secondary | ICD-10-CM | POA: Insufficient documentation

## 2022-06-23 DIAGNOSIS — R079 Chest pain, unspecified: Secondary | ICD-10-CM | POA: Insufficient documentation

## 2022-06-23 DIAGNOSIS — R011 Cardiac murmur, unspecified: Secondary | ICD-10-CM | POA: Insufficient documentation

## 2022-07-04 DIAGNOSIS — E785 Hyperlipidemia, unspecified: Secondary | ICD-10-CM | POA: Diagnosis not present

## 2022-07-04 DIAGNOSIS — F319 Bipolar disorder, unspecified: Secondary | ICD-10-CM | POA: Diagnosis not present

## 2022-07-13 DIAGNOSIS — L570 Actinic keratosis: Secondary | ICD-10-CM | POA: Diagnosis not present

## 2022-07-13 DIAGNOSIS — L821 Other seborrheic keratosis: Secondary | ICD-10-CM | POA: Diagnosis not present

## 2022-07-13 DIAGNOSIS — B029 Zoster without complications: Secondary | ICD-10-CM | POA: Diagnosis not present

## 2022-07-27 ENCOUNTER — Ambulatory Visit: Payer: Medicare HMO | Admitting: Cardiology

## 2022-07-28 DIAGNOSIS — M7989 Other specified soft tissue disorders: Secondary | ICD-10-CM | POA: Diagnosis not present

## 2022-07-28 DIAGNOSIS — Z9181 History of falling: Secondary | ICD-10-CM | POA: Diagnosis not present

## 2022-07-28 DIAGNOSIS — M19042 Primary osteoarthritis, left hand: Secondary | ICD-10-CM | POA: Diagnosis not present

## 2022-07-28 DIAGNOSIS — Z6833 Body mass index (BMI) 33.0-33.9, adult: Secondary | ICD-10-CM | POA: Diagnosis not present

## 2022-07-28 DIAGNOSIS — Z1331 Encounter for screening for depression: Secondary | ICD-10-CM | POA: Diagnosis not present

## 2022-07-28 DIAGNOSIS — M79642 Pain in left hand: Secondary | ICD-10-CM | POA: Diagnosis not present

## 2022-07-28 DIAGNOSIS — I10 Essential (primary) hypertension: Secondary | ICD-10-CM | POA: Diagnosis not present

## 2022-07-28 DIAGNOSIS — M25551 Pain in right hip: Secondary | ICD-10-CM | POA: Diagnosis not present

## 2022-07-28 DIAGNOSIS — M25552 Pain in left hip: Secondary | ICD-10-CM | POA: Diagnosis not present

## 2022-07-29 ENCOUNTER — Encounter: Payer: Self-pay | Admitting: Cardiology

## 2022-07-29 ENCOUNTER — Ambulatory Visit: Payer: Medicare HMO | Attending: Cardiology | Admitting: Cardiology

## 2022-07-29 VITALS — BP 130/86 | HR 88 | Ht 60.0 in | Wt 168.0 lb

## 2022-07-29 DIAGNOSIS — I2510 Atherosclerotic heart disease of native coronary artery without angina pectoris: Secondary | ICD-10-CM

## 2022-07-29 DIAGNOSIS — I10 Essential (primary) hypertension: Secondary | ICD-10-CM | POA: Diagnosis not present

## 2022-07-29 DIAGNOSIS — R0609 Other forms of dyspnea: Secondary | ICD-10-CM

## 2022-07-29 DIAGNOSIS — R0989 Other specified symptoms and signs involving the circulatory and respiratory systems: Secondary | ICD-10-CM

## 2022-07-29 DIAGNOSIS — E66811 Obesity, class 1: Secondary | ICD-10-CM

## 2022-07-29 DIAGNOSIS — J431 Panlobular emphysema: Secondary | ICD-10-CM

## 2022-07-29 DIAGNOSIS — I700 Atherosclerosis of aorta: Secondary | ICD-10-CM

## 2022-07-29 DIAGNOSIS — I25119 Atherosclerotic heart disease of native coronary artery with unspecified angina pectoris: Secondary | ICD-10-CM | POA: Insufficient documentation

## 2022-07-29 DIAGNOSIS — E782 Mixed hyperlipidemia: Secondary | ICD-10-CM

## 2022-07-29 DIAGNOSIS — R011 Cardiac murmur, unspecified: Secondary | ICD-10-CM

## 2022-07-29 DIAGNOSIS — G4733 Obstructive sleep apnea (adult) (pediatric): Secondary | ICD-10-CM | POA: Diagnosis not present

## 2022-07-29 DIAGNOSIS — E669 Obesity, unspecified: Secondary | ICD-10-CM | POA: Diagnosis not present

## 2022-07-29 HISTORY — DX: Atherosclerosis of aorta: I70.0

## 2022-07-29 HISTORY — DX: Atherosclerotic heart disease of native coronary artery with unspecified angina pectoris: I25.119

## 2022-07-29 HISTORY — DX: Atherosclerotic heart disease of native coronary artery without angina pectoris: I25.10

## 2022-07-29 HISTORY — DX: Obesity, class 1: E66.811

## 2022-07-29 HISTORY — DX: Other forms of dyspnea: R06.09

## 2022-07-29 MED ORDER — ASPIRIN 81 MG PO TBEC
81.0000 mg | DELAYED_RELEASE_TABLET | Freq: Every day | ORAL | 3 refills | Status: AC
Start: 2022-07-29 — End: ?

## 2022-07-29 MED ORDER — NITROGLYCERIN 0.4 MG SL SUBL
0.4000 mg | SUBLINGUAL_TABLET | SUBLINGUAL | 6 refills | Status: DC | PRN
Start: 2022-07-29 — End: 2023-02-18

## 2022-07-29 NOTE — Progress Notes (Signed)
Cardiology Office Note:    Date:  07/29/2022   ID:  Tracy Oconnor, DOB 08/02/1960, MRN [id number]  PCP:  Abner Greenspan, MD  Cardiologist:  Garwin Brothers, MD   Referring MD: Marcellus Scott, MD    ASSESSMENT:    1. Hypertension, unspecified type   2. DOE (dyspnea on exertion)   3. Coronary artery disease involving native coronary artery of native heart without angina pectoris   4. Aortic atherosclerosis (HCC)   5. Panlobular emphysema (HCC)   6. OSA (obstructive sleep apnea)   7. Mixed hyperlipidemia   8. Murmur   9. Obesity (BMI 30.0-34.9)   10. Abdominal bruit    PLAN:    In order of problems listed above:  Coronary artery disease and dyspnea on exertion: Secondary prevention stressed with patient.  Importance of compliance with diet medication stressed and she vocalized understanding.  To evaluate her symptoms we will do a Lexiscan sestamibi to see if any of this is from an ischemic substrate.  In the interim following recommendations were given she was advised to take a coated baby aspirin on a daily basis.  Sublingual nitroglycerin prescription was sent, its protocol and 911 protocol explained and the patient vocalized understanding questions were answered to the patient's satisfaction Cardiac murmur: Echocardiogram will be done to assess murmur heard on auscultation. Essential hypertension: Blood pressure is stable and diet was emphasized. COPD and abdominal bruit with history of smoking: Will do an abdominal ultrasound to rule out aneurysm.  She promises never to go back to smoking. Obesity: Weight reduction stressed.  Risks of obesity emphasized Lipid-lowering medications and lipids are fine per KPN sheet Patient will be seen in follow-up appointment in 6 months or earlier if the patient has any concerns.    Medication Adjustments/Labs and Tests Ordered: Current medicines are reviewed at length with the patient today.  Concerns regarding medicines are outlined above.   Orders Placed This Encounter  Procedures   MYOCARDIAL PERFUSION IMAGING   EKG 12-Lead   ECHOCARDIOGRAM COMPLETE   VAS [place] AAA DUPLEX   Meds ordered this encounter  Medications   aspirin EC 81 MG tablet    Sig: Take 1 tablet (81 mg total) by mouth daily. Swallow whole.    Dispense:  90 tablet    Refill:  3   nitroGLYCERIN (NITROSTAT) 0.4 MG SL tablet    Sig: Place 1 tablet (0.4 mg total) under the tongue every 5 (five) minutes as needed.    Dispense:  25 tablet    Refill:  6     History of Present Illness:    Tracy Oconnor is a 62 y.o. female who is being seen today for the evaluation of coronary artery disease and dyspnea on exertion at the request of Marcellus Scott, MD. patient is a pleasant 62 year old female.  She has past medical history of coronary artery disease and atherosclerosis of aorta diagnosed by screening CT scan.  She has history of essential hypertension dyslipidemia COPD, obstructive sleep apnea.  Unfortunately she has been a heavy smoker but quit about 2 years ago according to the patient.  Currently she has no chest pain orthopnea or PND.  She has dyspnea on exertion.  At the time of my evaluation, the patient is alert awake oriented and in no distress.  Past Medical History:  Diagnosis Date   Atherosclerosis    Bilateral carpal tunnel syndrome 01/30/2021   Bipolar disorder (HCC)    Cervical radiculopathy 01/30/2021   COPD (chronic  obstructive pulmonary disease) (HCC)    Edema    Enteritis 12/17/2020   Fatigue    Hyperlipidemia    Hypertension    Intermittent chest pain    Murmur    OSA (obstructive sleep apnea)    Palpitations     Past Surgical History:  Procedure Laterality Date   CHOLECYSTECTOMY     TONSILLECTOMY     TUBAL LIGATION      Current Medications: Current Meds  Medication Sig   albuterol (VENTOLIN HFA) 108 (90 Base) MCG/ACT inhaler Inhale 2 puffs into the lungs every 4 (four) hours as needed for shortness of breath or wheezing.    ARIPiprazole (ABILIFY) 15 MG tablet Take 15 mg by mouth daily.   aspirin EC 81 MG tablet Take 1 tablet (81 mg total) by mouth daily. Swallow whole.   atorvastatin (LIPITOR) 20 MG tablet Take 20 mg by mouth daily.   citalopram (CELEXA) 40 MG tablet Take 40 mg by mouth daily.   diclofenac (VOLTAREN) 75 MG EC tablet Take 75 mg by mouth 2 (two) times daily.   fluticasone (FLONASE) 50 MCG/ACT nasal spray Place 1 spray into both nostrils daily.   gabapentin (NEURONTIN) 400 MG capsule Take 400 mg by mouth 3 (three) times daily.   lidocaine (LIDODERM) 5 % Place 1 patch onto the skin daily.   losartan (COZAAR) 25 MG tablet Take 25 mg by mouth daily.   montelukast (SINGULAIR) 10 MG tablet Take 10 mg by mouth at bedtime.   nitroGLYCERIN (NITROSTAT) 0.4 MG SL tablet Place 1 tablet (0.4 mg total) under the tongue every 5 (five) minutes as needed.   QUEtiapine (SEROQUEL) 300 MG tablet Take 300 mg by mouth at bedtime.   traZODone (DESYREL) 50 MG tablet Take 50 mg by mouth as needed for sleep.   TRELEGY ELLIPTA 200-62.5-25 MCG/ACT AEPB Inhale 1 puff into the lungs daily.     Allergies:   Penicillins   Social History   Socioeconomic History   Marital status: Single    Spouse name: Not on file   Number of children: Not on file   Years of education: Not on file   Highest education level: Not on file  Occupational History   Not on file  Tobacco Use   Smoking status: Former    Types: Cigarettes   Smokeless tobacco: Never  Substance and Sexual Activity   Alcohol use: Not on file   Drug use: Not on file   Sexual activity: Not on file  Other Topics Concern   Not on file  Social History Narrative   Not on file   Social Determinants of Health   Financial Resource Strain: Not on file  Food Insecurity: Not on file  Transportation Needs: Not on file  Physical Activity: Not on file  Stress: Not on file  Social Connections: Not on file     Family History: The patient's family history includes  Breast cancer in her maternal aunt; Cancer in her father; Heart disease in her mother.  ROS:   Please see the history of present illness.    All other systems reviewed and are negative.  EKGs/Labs/Other Studies Reviewed:    The following studies were reviewed today: EKG with sinus rhythm poor anterior forces and nonspecific ST-T changes.    Recent Labs: No results found for requested labs within last 365 days.  Recent Lipid Panel No results found for: "CHOL", "TRIG", "HDL", "CHOLHDL", "VLDL", "LDLCALC", "LDLDIRECT"  Physical Exam:    VS:  BP  130/86   Pulse 88   Ht 5' (1.524 m)   Wt 168 lb (76.2 kg)   SpO2 (!) 89%   BMI 32.81 kg/m     Wt Readings from Last 3 Encounters:  07/29/22 168 lb (76.2 kg)     GEN: Patient is in no acute distress HEENT: Normal NECK: No JVD; No carotid bruits LYMPHATICS: No lymphadenopathy CARDIAC: S1 S2 regular, 2/6 systolic murmur at the apex. RESPIRATORY:  Clear to auscultation without rales, wheezing or rhonchi  ABDOMEN: Soft, non-tender, non-distended MUSCULOSKELETAL:  No edema; No deformity  SKIN: Warm and dry NEUROLOGIC:  Alert and oriented x 3 PSYCHIATRIC:  Normal affect    Signed, Garwin Brothers, MD  07/29/2022 9:17 AM     [facility]

## 2022-07-29 NOTE — Patient Instructions (Signed)
Medication Instructions:  Your physician has recommended you make the following change in your medication:   Start taking 81 mg coated aspirin daily.  Use nitroglycerin 1 tablet placed under the tongue at the first sign of chest pain or an angina attack. 1 tablet may be used every 5 minutes as needed, for up to 15 minutes. Do not take more than 3 tablets in 15 minutes. If pain persist call 911 or go to the nearest ED.   *If you need a refill on your cardiac medications before your next appointment, please call your pharmacy*   Lab Work: None ordered If you have labs (blood work) drawn today and your tests are completely normal, you will receive your results only by: MyChart Message (if you have MyChart) OR A paper copy in the mail If you have any lab test that is abnormal or we need to change your treatment, we will call you to review the results.   Testing/Procedures: You are scheduled for a Myocardial Perfusion Imaging Study.  Please arrive 15 minutes prior to your appointment time for registration and insurance purposes.  The test will take approximately 3 to 4 hours to complete; you may bring reading material.  If someone comes with you to your appointment, they will need to remain in the main lobby due to limited space in the testing area.   How to prepare for your Myocardial Perfusion Test: Do not eat or drink 3 hours prior to your test, except you may have water. Do not consume products containing caffeine (regular or decaffeinated) 12 hours prior to your test. (ex: coffee, chocolate, sodas, tea). Do bring a list of your current medications with you.  If not listed below, you may take your medications as normal. Do wear comfortable clothes (no dresses or overalls) and walking shoes, tennis shoes preferred (No heels or open toe shoes are allowed). Do NOT wear cologne, perfume, aftershave, or lotions (deodorant is allowed). If these instructions are not followed, your test will  have to be rescheduled.  If you cannot keep your appointment, please provide 24 hours notification to the Nuclear Lab, to avoid a possible $50 charge to your account.   Your physician has requested that you have an echocardiogram. Echocardiography is a painless test that uses sound waves to create images of your heart. It provides your doctor with information about the size and shape of your heart and how well your heart's chambers and valves are working. This procedure takes approximately one hour. There are no restrictions for this procedure. Please do NOT wear cologne, perfume, aftershave, or lotions (deodorant is allowed). Please arrive 15 minutes prior to your appointment time.  Your physician has requested that you have an abdominal aorta duplex. During this test, an ultrasound is used to evaluate the aorta. Allow 30 minutes for this exam. Do not eat after midnight the day before and avoid carbonated beverages   Follow-Up: At [facility], you and your health needs are our priority.  As part of our continuing mission to provide you with exceptional heart care, we have created designated Provider Care Teams.  These Care Teams include your primary Cardiologist (physician) and Advanced Practice Providers (APPs -  Physician Assistants and Nurse Practitioners) who all work together to provide you with the care you need, when you need it.  We recommend signing up for the patient portal called "MyChart".  Sign up information is provided on this After Visit Summary.  MyChart is used to connect with  patients for Virtual Visits (Telemedicine).  Patients are able to view lab/test results, encounter notes, upcoming appointments, etc.  Non-urgent messages can be sent to your provider as well.   To learn more about what you can do with MyChart, go to [email or web address].    Your next appointment:   9 month(s)  Provider:   Belva Crome, MD   Other Instructions  Cardiac Nuclear Scan A  cardiac nuclear scan is a test that is done to check the flow of blood to your heart. It is done when you are resting and when you are exercising. The test looks for problems such as: Not enough blood reaching a portion of the heart. The heart muscle not working as it should. You may need this test if you have: Heart disease. Lab results that are not normal. Had heart surgery or a balloon procedure to open up blocked arteries (angioplasty) or a small mesh tube (stent). Chest pain. Shortness of breath. Had a heart attack. In this test, a special dye (tracer) is put into your bloodstream. The tracer will travel to your heart. A camera will then take pictures of your heart to see how the tracer moves through your heart. This test is usually done at a hospital and takes 2-4 hours. Tell a doctor about: Any allergies you have. All medicines you are taking, including vitamins, herbs, eye drops, creams, and over-the-counter medicines. Any bleeding problems you have. Any surgeries you have had. Any medical conditions you have. Whether you are pregnant or may be pregnant. Any history of asthma or long-term (chronic) lung disease. Any history of heart rhythm disorders or heart valve conditions. What are the risks? Your doctor will talk with you about risks. These may include: Serious chest pain and heart attack. This is only a risk if the stress portion of the test is done. Fast or uneven heartbeats (palpitations). A feeling of warmth in your chest. This feeling usually does not last long. Allergic reaction to the tracer. Shortness of breath or trouble breathing. What happens before the test? Ask your doctor about changing or stopping your normal medicines. Follow instructions from your doctor about what you cannot eat or drink. Remove your jewelry on the day of the test. Ask your doctor if you need to avoid nicotine or caffeine. What happens during the test? An IV tube will be inserted into  one of your veins. Your doctor will give you a small amount of tracer through the IV tube. You will wait for 20-40 minutes while the tracer moves through your bloodstream. Your heart will be monitored with an electrocardiogram (ECG). You will lie down on an exam table. Pictures of your heart will be taken for about 15-20 minutes. You may also have a stress test. For this test, one of these things may be done: You will be asked to exercise on a treadmill or a stationary bike. You will be given medicines that will make your heart work harder. This is done if you are unable to exercise. When blood flow to your heart has peaked, a tracer will again be given through the IV tube. After 20-40 minutes, you will get back on the exam table. More pictures will be taken of your heart. Depending on the tracer that is used, more pictures may need to be taken 3-4 hours later. Your IV tube will be removed when the test is over. The test may vary among doctors and hospitals. What happens after the test? Ask your doctor: Whether you  can return to your normal schedule, including diet, activities, travel, and medicines. Whether you should drink more fluids. This will help to remove the tracer from your body. Ask your doctor, or the department that is doing the test: When will my results be ready? How will I get my results? What are my treatment options? What other tests do I need? What are my next steps? This information is not intended to replace advice given to you by your health care provider. Make sure you discuss any questions you have with your health care provider. Document Revised: 06/17/2021 Document Reviewed: 06/17/2021 Elsevier Patient Education  2023 Elsevier Inc.  Echocardiogram An echocardiogram is a test that uses sound waves (ultrasound) to produce images of the heart. Images from an echocardiogram can provide important information about: Heart size and shape. The size and thickness and  movement of your heart's walls. Heart muscle function and strength. Heart valve function or if you have stenosis. Stenosis is when the heart valves are too narrow. If blood is flowing backward through the heart valves (regurgitation). A tumor or infectious growth around the heart valves. Areas of heart muscle that are not working well because of poor blood flow or injury from a heart attack. Aneurysm detection. An aneurysm is a weak or damaged part of an artery wall. The wall bulges out from the normal force of blood pumping through the body. Tell a health care provider about: Any allergies you have. All medicines you are taking, including vitamins, herbs, eye drops, creams, and over-the-counter medicines. Any blood disorders you have. Any surgeries you have had. Any medical conditions you have. Whether you are pregnant or may be pregnant. What are the risks? Generally, this is a safe test. However, problems may occur, including an allergic reaction to dye (contrast) that may be used during the test. What happens before the test? No specific preparation is needed. You may eat and drink normally. What happens during the test?  You will take off your clothes from the waist up and put on a hospital gown. Electrodes or electrocardiogram (ECG)patches may be placed on your chest. The electrodes or patches are then connected to a device that monitors your heart rate and rhythm. You will lie down on a table for an ultrasound exam. A gel will be applied to your chest to help sound waves pass through your skin. A handheld device, called a transducer, will be pressed against your chest and moved over your heart. The transducer produces sound waves that travel to your heart and bounce back (or "echo" back) to the transducer. These sound waves will be captured in real-time and changed into images of your heart that can be viewed on a video monitor. The images will be recorded on a computer and reviewed by  your health care provider. You may be asked to change positions or hold your breath for a short time. This makes it easier to get different views or better views of your heart. In some cases, you may receive contrast through an IV in one of your veins. This can improve the quality of the pictures from your heart. The procedure may vary among health care providers and hospitals. What can I expect after the test? You may return to your normal, everyday life, including diet, activities, and medicines, unless your health care provider tells you not to do that. Follow these instructions at home: It is up to you to get the results of your test. Ask your health care provider,  or the department that is doing the test, when your results will be ready. Keep all follow-up visits. This is important. Summary An echocardiogram is a test that uses sound waves (ultrasound) to produce images of the heart. Images from an echocardiogram can provide important information about the size and shape of your heart, heart muscle function, heart valve function, and other possible heart problems. You do not need to do anything to prepare before this test. You may eat and drink normally. After the echocardiogram is completed, you may return to your normal, everyday life, unless your health care provider tells you not to do that. This information is not intended to replace advice given to you by your health care provider. Make sure you discuss any questions you have with your health care provider. Document Revised: 10/02/2020 Document Reviewed: 09/12/2019 Elsevier Patient Education  2023 [employer].

## 2022-07-30 ENCOUNTER — Ambulatory Visit: Payer: Medicare HMO | Attending: Cardiology

## 2022-07-30 DIAGNOSIS — I2510 Atherosclerotic heart disease of native coronary artery without angina pectoris: Secondary | ICD-10-CM | POA: Diagnosis not present

## 2022-07-30 DIAGNOSIS — R0609 Other forms of dyspnea: Secondary | ICD-10-CM

## 2022-07-30 LAB — MYOCARDIAL PERFUSION IMAGING
LV dias vol: 87 mL (ref 46–106)
LV sys vol: 44 mL
Nuc Stress EF: 49 %
Peak HR: 96 {beats}/min
Rest HR: 59 {beats}/min
Rest Nuclear Isotope Dose: 10.8 mCi
SDS: 6
SRS: 3
SSS: 9
ST Depression (mm): 0 mm
Stress Nuclear Isotope Dose: 30.8 mCi
TID: 1.16

## 2022-07-30 MED ORDER — TECHNETIUM TC 99M TETROFOSMIN IV KIT
10.8000 | PACK | Freq: Once | INTRAVENOUS | Status: AC | PRN
  Administered 2022-07-30: 10.8 via INTRAVENOUS

## 2022-07-30 MED ORDER — TECHNETIUM TC 99M TETROFOSMIN IV KIT
30.8000 | PACK | Freq: Once | INTRAVENOUS | Status: AC | PRN
  Administered 2022-07-30: 30.8 via INTRAVENOUS

## 2022-07-30 MED ORDER — REGADENOSON 0.4 MG/5ML IV SOLN
0.4000 mg | Freq: Once | INTRAVENOUS | Status: AC
Start: 2022-07-30 — End: 2022-07-30
  Administered 2022-07-30: 0.4 mg via INTRAVENOUS

## 2022-08-03 ENCOUNTER — Ambulatory Visit: Payer: Medicare HMO | Admitting: Cardiology

## 2022-08-03 DIAGNOSIS — K567 Ileus, unspecified: Secondary | ICD-10-CM | POA: Diagnosis not present

## 2022-08-03 DIAGNOSIS — J449 Chronic obstructive pulmonary disease, unspecified: Secondary | ICD-10-CM | POA: Diagnosis not present

## 2022-08-11 ENCOUNTER — Ambulatory Visit: Payer: Medicare HMO | Attending: Cardiology | Admitting: Cardiology

## 2022-08-11 ENCOUNTER — Encounter: Payer: Self-pay | Admitting: Cardiology

## 2022-08-11 VITALS — BP 140/88 | HR 63 | Ht 60.0 in | Wt 166.8 lb

## 2022-08-11 DIAGNOSIS — I2510 Atherosclerotic heart disease of native coronary artery without angina pectoris: Secondary | ICD-10-CM

## 2022-08-11 DIAGNOSIS — R0609 Other forms of dyspnea: Secondary | ICD-10-CM | POA: Diagnosis not present

## 2022-08-11 DIAGNOSIS — I10 Essential (primary) hypertension: Secondary | ICD-10-CM | POA: Diagnosis not present

## 2022-08-11 NOTE — Progress Notes (Signed)
Cardiology Office Note:  .   Date:  08/11/2022  ID:  Tracy Oconnor, DOB August 24, 1960, MRN [id number] PCP: Abner Greenspan, MD  [facility] Providers Cardiologist:  None    History of Present Illness: .   Tracy Oconnor is a 62 y.o. female with a past medical history of hypertension, aortic atherosclerosis noted on CT imaging, COPD, OSA, palpitations, bipolar disorder, hyperlipidemia, former smoker cessation 2022.  She establish care with Dr. Tomie China on 07/29/2022 at the behest of her pulmonologist for DOE and CAD.  She underwent a Lexiscan stress evaluation which revealed global function was mildly reduced, EF 49%, findings consistent with ischemia.  An echocardiogram, AAA duplex were arranged but have not yet been completed.  She presents today for follow-up after abnormal stress evaluation.  She has ongoing DOE, hard to decipher if it is related to her COPD or could be an anginal equivalent.  She is relatively sedentary, does not exert herself very much, secondary to COPD.  She is oxygen dependent, followed by Dr. Winfred Leeds. She denies chest pain, palpitations, pnd, orthopnea, n, v, dizziness, syncope, edema, weight gain, or early satiety.   ROS: Review of Systems  Constitutional:  Positive for malaise/fatigue.  HENT: Negative.    Eyes: Negative.   Respiratory:  Positive for shortness of breath.   Cardiovascular: Negative.   Gastrointestinal: Negative.   Genitourinary: Negative.   Musculoskeletal: Negative.   Skin: Negative.   Neurological: Negative.   Endo/Heme/Allergies:  Bruises/bleeds easily.  Psychiatric/Behavioral: Negative.      Studies Reviewed: .        Cardiac Studies & Procedures     STRESS TESTS  MYOCARDIAL PERFUSION IMAGING 07/30/2022  Narrative   Findings are consistent with ischemia involving basal, mid and apical portion of the inferior wall. The study is intermediate risk secondary to mildly diminished EF.   No ST deviation was noted.   Left ventricular  function is abnormal. Global function is mildly reduced. Nuclear stress EF: 49 %. The left ventricular ejection fraction is mildly decreased (45-54%). End diastolic cavity size is normal.   Prior study not available for comparison.              Risk Assessment/Calculations:     HYPERTENSION CONTROL Vitals:   08/11/22 0947 08/11/22 1617  BP: (!) 140/88 (!) 140/88    The patient's blood pressure is elevated above target today.  In order to address the patient's elevated BP: Blood pressure will be monitored at home to determine if medication changes need to be made.          Physical Exam:   VS:  BP (!) 140/88   Pulse 63   Ht 5' (1.524 m)   Wt 166 lb 12.8 oz (75.7 kg)   SpO2 95%   BMI 32.58 kg/m    Wt Readings from Last 3 Encounters:  08/11/22 166 lb 12.8 oz (75.7 kg)  07/30/22 168 lb (76.2 kg)  07/29/22 168 lb (76.2 kg)    GEN: chronically ill-appearing, no acute distress NECK: No JVD; No carotid bruits CARDIAC: RRR, no murmurs, rubs, gallops RESPIRATORY:  diminished in bases, expiratory wheezing ABDOMEN: Soft, non-tender, non-distended EXTREMITIES:  No edema; No deformity   ASSESSMENT AND PLAN: .   Coronary artery disease - three vessel CAD noted on CTA at [facility]. Lexiscan was abnormal and consistent with ischemia.  She does not have chest pain however she does have shortness of breath, which could be an anginal equivalent. She is very  sedentary secondary to her COPD and does not exert herself, her SOB could be an anginal equivalent.  Discussed with DOD, Dr. Tomie China. We discussed management with medications versus proceeding with a left heart catheterization, she would like to proceed with a left heart catheterization.  Explained risk/benefits and answered questions to her satisfaction and she agrees to proceed with left heart catheterization.  Continue aspirin 81 mg daily, continue Lipitor 20 mg daily, continue nitroglycerin as needed--has not needed.  Will repeat  CMET, direct LDL, lipoprotein A.   Hyperlipidemia-currently on Lipitor 20 mg daily, repeat in direct LDL today, will likely need her Lipitor adjusted.  Hypertension-blood pressure is marginally elevated at 140/80 however she is visibly nervous and voices that she is nervous as well.  Continue losartan 25 mg daily.  If her blood pressure remains elevated will increase her losartan to 50.  COPD -former smoker complete cessation x 2 years, followed by Dr. Winfred Leeds.  She is on oxygen 3 L continuous.      Informed Consent   Shared Decision Making/Informed Consent The risks [stroke (1 in 1000), death (1 in 1000), kidney failure [usually temporary] (1 in 500), bleeding (1 in 200), allergic reaction [possibly serious] (1 in 200)], benefits (diagnostic support and management of coronary artery disease) and alternatives of a cardiac catheterization were discussed in detail with Ms. Baglio and she is willing to proceed.     Dispo: Left heart catheterization, CBC, CMET, direct LDL, lipoprotein a.  Return to general cardiology in 3 weeks.  Signed, Flossie Dibble, NP

## 2022-08-11 NOTE — Patient Instructions (Signed)
Medication Instructions:  Your physician recommends that you continue on your current medications as directed. Please refer to the Current Medication list given to you today.  *If you need a refill on your cardiac medications before your next appointment, please call your pharmacy*   Lab Work: Your physician recommends that you return for lab work in: Today for CMP, CBC, Direct LDL, LPA  If you have labs (blood work) drawn today and your tests are completely normal, you will receive your results only by: MyChart Message (if you have MyChart) OR A paper copy in the mail If you have any lab test that is abnormal or we need to change your treatment, we will call you to review the results.   Testing/Procedure  [place] [facility] [address] [place] [address] Dept: [phone] Loc: [phone]  MORJORIE KONOPA  08/11/2022  You are scheduled for a Cardiac Catheterization on Thursday, July 11 with Dr. Cristal [place].  1. Please arrive at the [facility] (Main Entrance A) at [facility]: [address] [place], [place] [address] at 5:30 AM (This time is 2 hour(s) before your procedure to ensure your preparation). Free valet parking service is available. You will check in at ADMITTING. The support person will be asked to wait in the waiting room.  It is OK to have someone drop you off and come back when you are ready to be discharged.    Special note: Every effort is made to have your procedure done on time. Please understand that emergencies sometimes delay scheduled procedures.  2. Diet: Do not eat solid foods after midnight.  The patient may have clear liquids until 5am upon the day of the procedure.  3. Labs: You will need to have blood drawn on Today Tuesday, July 9 at [employer] [employer]: [address] [place] . You do not need to be fasting.  4. Medication instructions in preparation  for your procedure:   Contrast Allergy: No    Do not take Losartan day of CATH      On the morning of your procedure, take your Aspirin 81 mg and any morning medicines NOT listed above.  You may use sips of water.  5. Plan to go home the same day, you will only stay overnight if medically necessary. 6. Bring a current list of your medications and current insurance cards. 7. You MUST have a responsible person to drive you home. 8. Someone MUST be with you the first 24 hours after you arrive home or your discharge will be delayed. 9. Please wear clothes that are easy to get on and off and wear slip-on shoes.  Thank you for allowing [place] to care for you!   -- [facility]    Follow-Up: At [facility], you and your health needs are our priority.  As part of our continuing mission to provide you with exceptional heart care, we have created designated Provider Care Teams.  These Care Teams include your primary Cardiologist (physician) and Advanced Practice Providers (APPs -  Physician Assistants and Nurse Practitioners) who all work together to provide you with the care you need, when you need it.  We recommend signing up for the patient portal called "MyChart".  Sign up information is provided on this After Visit Summary.  MyChart is used to connect with patients for Virtual Visits (Telemedicine).  Patients are able to  view lab/test results, encounter notes, upcoming appointments, etc.  Non-urgent messages can be sent to your provider as well.   To learn more about what you can do with MyChart, go to [email or web address].    Your next appointment:   3 week(s)  Provider:   Wallis Bamberg, NP Rosalita Levan)    Other Instructions

## 2022-08-11 NOTE — H&P (View-Only) (Signed)
Cardiology Office Note:  .   Date:  08/11/2022  ID:  Tracy Oconnor, DOB Feb 04, 1960, MRN [id number] PCP: Abner Greenspan, MD  [facility] Providers Cardiologist:  None    History of Present Illness: .   Tracy Oconnor is a 62 y.o. female with a past medical history of hypertension, aortic atherosclerosis noted on CT imaging, COPD, OSA, palpitations, bipolar disorder, hyperlipidemia, former smoker cessation 2022.  She establish care with Dr. Tomie China on 07/29/2022 at the behest of her pulmonologist for DOE and CAD.  She underwent a Lexiscan stress evaluation which revealed global function was mildly reduced, EF 49%, findings consistent with ischemia.  An echocardiogram, AAA duplex were arranged but have not yet been completed.  She presents today for follow-up after abnormal stress evaluation.  She has ongoing DOE, hard to decipher if it is related to her COPD or could be an anginal equivalent.  She is relatively sedentary, does not exert herself very much, secondary to COPD.  She is oxygen dependent, followed by Dr. Winfred Leeds. She denies chest pain, palpitations, pnd, orthopnea, n, v, dizziness, syncope, edema, weight gain, or early satiety.   ROS: Review of Systems  Constitutional:  Positive for malaise/fatigue.  HENT: Negative.    Eyes: Negative.   Respiratory:  Positive for shortness of breath.   Cardiovascular: Negative.   Gastrointestinal: Negative.   Genitourinary: Negative.   Musculoskeletal: Negative.   Skin: Negative.   Neurological: Negative.   Endo/Heme/Allergies:  Bruises/bleeds easily.  Psychiatric/Behavioral: Negative.      Studies Reviewed: .        Cardiac Studies & Procedures     STRESS TESTS  MYOCARDIAL PERFUSION IMAGING 07/30/2022  Narrative   Findings are consistent with ischemia involving basal, mid and apical portion of the inferior wall. The study is intermediate risk secondary to mildly diminished EF.   No ST deviation was noted.   Left ventricular  function is abnormal. Global function is mildly reduced. Nuclear stress EF: 49 %. The left ventricular ejection fraction is mildly decreased (45-54%). End diastolic cavity size is normal.   Prior study not available for comparison.              Risk Assessment/Calculations:     HYPERTENSION CONTROL Vitals:   08/11/22 0947 08/11/22 1617  BP: (!) 140/88 (!) 140/88    The patient's blood pressure is elevated above target today.  In order to address the patient's elevated BP: Blood pressure will be monitored at home to determine if medication changes need to be made.          Physical Exam:   VS:  BP (!) 140/88   Pulse 63   Ht 5' (1.524 m)   Wt 166 lb 12.8 oz (75.7 kg)   SpO2 95%   BMI 32.58 kg/m    Wt Readings from Last 3 Encounters:  08/11/22 166 lb 12.8 oz (75.7 kg)  07/30/22 168 lb (76.2 kg)  07/29/22 168 lb (76.2 kg)    GEN: chronically ill-appearing, no acute distress NECK: No JVD; No carotid bruits CARDIAC: RRR, no murmurs, rubs, gallops RESPIRATORY:  diminished in bases, expiratory wheezing ABDOMEN: Soft, non-tender, non-distended EXTREMITIES:  No edema; No deformity   ASSESSMENT AND PLAN: .   Coronary artery disease - three vessel CAD noted on CTA at [facility]. Lexiscan was abnormal and consistent with ischemia.  She does not have chest pain however she does have shortness of breath, which could be an anginal equivalent. She is very  sedentary secondary to her COPD and does not exert herself, her SOB could be an anginal equivalent.  Discussed with DOD, Dr. Tomie China. We discussed management with medications versus proceeding with a left heart catheterization, she would like to proceed with a left heart catheterization.  Explained risk/benefits and answered questions to her satisfaction and she agrees to proceed with left heart catheterization.  Continue aspirin 81 mg daily, continue Lipitor 20 mg daily, continue nitroglycerin as needed--has not needed.  Will repeat  CMET, direct LDL, lipoprotein A.   Hyperlipidemia-currently on Lipitor 20 mg daily, repeat in direct LDL today, will likely need her Lipitor adjusted.  Hypertension-blood pressure is marginally elevated at 140/80 however she is visibly nervous and voices that she is nervous as well.  Continue losartan 25 mg daily.  If her blood pressure remains elevated will increase her losartan to 50.  COPD -former smoker complete cessation x 2 years, followed by Dr. Winfred Leeds.  She is on oxygen 3 L continuous.      Informed Consent   Shared Decision Making/Informed Consent The risks [stroke (1 in 1000), death (1 in 1000), kidney failure [usually temporary] (1 in 500), bleeding (1 in 200), allergic reaction [possibly serious] (1 in 200)], benefits (diagnostic support and management of coronary artery disease) and alternatives of a cardiac catheterization were discussed in detail with Tracy Oconnor and she is willing to proceed.     Dispo: Left heart catheterization, CBC, CMET, direct LDL, lipoprotein a.  Return to general cardiology in 3 weeks.  Signed, Flossie Dibble, NP

## 2022-08-12 ENCOUNTER — Telehealth: Payer: Self-pay | Admitting: *Deleted

## 2022-08-12 ENCOUNTER — Other Ambulatory Visit: Payer: Self-pay | Admitting: Family Medicine

## 2022-08-12 DIAGNOSIS — Z1231 Encounter for screening mammogram for malignant neoplasm of breast: Secondary | ICD-10-CM

## 2022-08-12 LAB — COMPREHENSIVE METABOLIC PANEL WITH GFR
ALT: 29 IU/L (ref 0–32)
AST: 22 IU/L (ref 0–40)
Albumin: 3.9 g/dL (ref 3.9–4.9)
Alkaline Phosphatase: 112 IU/L (ref 44–121)
BUN/Creatinine Ratio: 11 — ABNORMAL LOW (ref 12–28)
BUN: 9 mg/dL (ref 8–27)
Bilirubin Total: 0.3 mg/dL (ref 0.0–1.2)
CO2: 25 mmol/L (ref 20–29)
Calcium: 9.4 mg/dL (ref 8.7–10.3)
Chloride: 105 mmol/L (ref 96–106)
Creatinine, Ser: 0.8 mg/dL (ref 0.57–1.00)
Globulin, Total: 2.3 g/dL (ref 1.5–4.5)
Glucose: 84 mg/dL (ref 70–99)
Potassium: 4.8 mmol/L (ref 3.5–5.2)
Sodium: 141 mmol/L (ref 134–144)
Total Protein: 6.2 g/dL (ref 6.0–8.5)
eGFR: 83 mL/min/1.73

## 2022-08-12 LAB — CBC WITH DIFFERENTIAL/PLATELET
Basophils Absolute: 0 x10E3/uL (ref 0.0–0.2)
Basos: 1 %
EOS (ABSOLUTE): 0.1 x10E3/uL (ref 0.0–0.4)
Eos: 1 %
Hematocrit: 41.5 % (ref 34.0–46.6)
Hemoglobin: 13.8 g/dL (ref 11.1–15.9)
Immature Grans (Abs): 0 x10E3/uL (ref 0.0–0.1)
Immature Granulocytes: 1 %
Lymphocytes Absolute: 1.6 x10E3/uL (ref 0.7–3.1)
Lymphs: 26 %
MCH: 30.5 pg (ref 26.6–33.0)
MCHC: 33.3 g/dL (ref 31.5–35.7)
MCV: 92 fL (ref 79–97)
Monocytes Absolute: 0.4 x10E3/uL (ref 0.1–0.9)
Monocytes: 7 %
Neutrophils Absolute: 4.1 x10E3/uL (ref 1.4–7.0)
Neutrophils: 64 %
Platelets: 282 x10E3/uL (ref 150–450)
RBC: 4.52 x10E6/uL (ref 3.77–5.28)
RDW: 15.6 % — ABNORMAL HIGH (ref 11.7–15.4)
WBC: 6.2 x10E3/uL (ref 3.4–10.8)

## 2022-08-12 LAB — LDL CHOLESTEROL, DIRECT: LDL Direct: 62 mg/dL (ref 0–99)

## 2022-08-12 LAB — LIPOPROTEIN A (LPA): Lipoprotein (a): 191.4 nmol/L — ABNORMAL HIGH

## 2022-08-12 NOTE — Telephone Encounter (Signed)
Cardiac Catheterization scheduled at [facility] for: Thursday August 13, 2022 7:30 AM Arrival time [place] Main Entrance A at: 5:30 AM  Nothing to eat after midnight prior to procedure, clear liquids until 5 AM day of procedure.  Medication instructions: -Usual morning medications can be taken with sips of water including aspirin 81 mg.  Confirmed patient has responsible adult to drive home post procedure and be with patient first 24 hours after arriving home.  Plan to go home the same day, you will only stay overnight if medically necessary.  Reviewed procedure instructions with patient.     

## 2022-08-12 NOTE — Telephone Encounter (Signed)
08/11/22 CMP and CBC results are scanned under Media Tab in Epic.

## 2022-08-13 ENCOUNTER — Encounter (HOSPITAL_COMMUNITY)
Admission: RE | Disposition: A | Discharge status: Home / Self Care | Payer: Self-pay | Source: Ambulatory Visit | Attending: Internal Medicine

## 2022-08-13 ENCOUNTER — Ambulatory Visit (HOSPITAL_COMMUNITY)
Admission: RE | Admit: 2022-08-13 | Discharge: 2022-08-13 | Disposition: A | Discharge status: Home / Self Care | Payer: Medicare HMO | Source: Ambulatory Visit | Attending: Internal Medicine | Admitting: Internal Medicine

## 2022-08-13 DIAGNOSIS — Z87891 Personal history of nicotine dependence: Secondary | ICD-10-CM | POA: Diagnosis not present

## 2022-08-13 DIAGNOSIS — E785 Hyperlipidemia, unspecified: Secondary | ICD-10-CM | POA: Diagnosis not present

## 2022-08-13 DIAGNOSIS — I10 Essential (primary) hypertension: Secondary | ICD-10-CM | POA: Diagnosis not present

## 2022-08-13 DIAGNOSIS — I2582 Chronic total occlusion of coronary artery: Secondary | ICD-10-CM | POA: Insufficient documentation

## 2022-08-13 DIAGNOSIS — R0602 Shortness of breath: Secondary | ICD-10-CM | POA: Diagnosis not present

## 2022-08-13 DIAGNOSIS — Z9981 Dependence on supplemental oxygen: Secondary | ICD-10-CM | POA: Diagnosis not present

## 2022-08-13 DIAGNOSIS — I2510 Atherosclerotic heart disease of native coronary artery without angina pectoris: Secondary | ICD-10-CM | POA: Diagnosis not present

## 2022-08-13 DIAGNOSIS — F319 Bipolar disorder, unspecified: Secondary | ICD-10-CM | POA: Insufficient documentation

## 2022-08-13 DIAGNOSIS — J449 Chronic obstructive pulmonary disease, unspecified: Secondary | ICD-10-CM | POA: Insufficient documentation

## 2022-08-13 DIAGNOSIS — G4733 Obstructive sleep apnea (adult) (pediatric): Secondary | ICD-10-CM | POA: Insufficient documentation

## 2022-08-13 DIAGNOSIS — Z79899 Other long term (current) drug therapy: Secondary | ICD-10-CM | POA: Insufficient documentation

## 2022-08-13 DIAGNOSIS — R9439 Abnormal result of other cardiovascular function study: Secondary | ICD-10-CM | POA: Diagnosis present

## 2022-08-13 HISTORY — PX: CORONARY PRESSURE/FFR STUDY: CATH118243

## 2022-08-13 HISTORY — PX: LEFT HEART CATH AND CORONARY ANGIOGRAPHY: CATH118249

## 2022-08-13 LAB — POCT ACTIVATED CLOTTING TIME: Activated Clotting Time: 269 seconds

## 2022-08-13 SURGERY — LEFT HEART CATH AND CORONARY ANGIOGRAPHY
Anesthesia: LOCAL

## 2022-08-13 MED ORDER — HYDRALAZINE HCL 20 MG/ML IJ SOLN: 10.0000 mg | INTRAMUSCULAR | Status: DC | PRN

## 2022-08-13 MED ORDER — ASPIRIN 81 MG PO CHEW: 81.0000 mg | CHEWABLE_TABLET | ORAL | Status: DC

## 2022-08-13 MED ORDER — SODIUM CHLORIDE 0.9% FLUSH: 3.0000 mL | Freq: Two times a day (BID) | INTRAVENOUS | Status: DC

## 2022-08-13 MED ORDER — HEPARIN SODIUM (PORCINE) 1000 UNIT/ML IJ SOLN
INTRAMUSCULAR | Status: DC | PRN
  Administered 2022-08-13: 2000 [IU] via INTRAVENOUS
  Administered 2022-08-13: 3500 [IU] via INTRAVENOUS

## 2022-08-13 MED ORDER — FENTANYL CITRATE (PF) 100 MCG/2ML IJ SOLN
INTRAMUSCULAR | Status: DC | PRN
  Administered 2022-08-13: 25 ug via INTRAVENOUS

## 2022-08-13 MED ORDER — SODIUM CHLORIDE 0.9% FLUSH: 3.0000 mL | INTRAVENOUS | Status: DC | PRN

## 2022-08-13 MED ORDER — IOHEXOL 350 MG/ML SOLN
INTRAVENOUS | Status: DC | PRN
  Administered 2022-08-13: 100 mL

## 2022-08-13 MED ORDER — VERAPAMIL HCL 2.5 MG/ML IV SOLN
INTRAVENOUS | Status: AC
  Filled 2022-08-13: qty 2

## 2022-08-13 MED ORDER — ACETAMINOPHEN 325 MG PO TABS: 650.0000 mg | ORAL_TABLET | ORAL | Status: DC | PRN

## 2022-08-13 MED ORDER — NITROGLYCERIN 1 MG/10 ML FOR IR/CATH LAB
INTRA_ARTERIAL | Status: DC | PRN
  Administered 2022-08-13: 200 ug via INTRACORONARY

## 2022-08-13 MED ORDER — NITROGLYCERIN 1 MG/10 ML FOR IR/CATH LAB
INTRA_ARTERIAL | Status: AC
  Filled 2022-08-13: qty 10

## 2022-08-13 MED ORDER — HEPARIN SODIUM (PORCINE) 1000 UNIT/ML IJ SOLN
INTRAMUSCULAR | Status: AC
  Filled 2022-08-13: qty 10

## 2022-08-13 MED ORDER — MIDAZOLAM HCL 2 MG/2ML IJ SOLN
INTRAMUSCULAR | Status: DC | PRN
  Administered 2022-08-13: 1 mg via INTRAVENOUS

## 2022-08-13 MED ORDER — MIDAZOLAM HCL 2 MG/2ML IJ SOLN
INTRAMUSCULAR | Status: AC
  Filled 2022-08-13: qty 2

## 2022-08-13 MED ORDER — SODIUM CHLORIDE 0.9 % IV SOLN: INTRAVENOUS | Status: DC

## 2022-08-13 MED ORDER — LIDOCAINE HCL (PF) 1 % IJ SOLN
INTRAMUSCULAR | Status: DC | PRN
  Administered 2022-08-13: 2 mL via INTRADERMAL

## 2022-08-13 MED ORDER — FENTANYL CITRATE (PF) 100 MCG/2ML IJ SOLN
INTRAMUSCULAR | Status: AC
  Filled 2022-08-13: qty 2

## 2022-08-13 MED ORDER — ISOSORBIDE MONONITRATE ER 30 MG PO TB24: 15.0000 mg | ORAL_TABLET | Freq: Every day | ORAL | 5 refills | Status: DC

## 2022-08-13 MED ORDER — SODIUM CHLORIDE 0.9 % WEIGHT BASED INFUSION: 1.0000 mL/kg/h | INTRAVENOUS | Status: DC

## 2022-08-13 MED ORDER — SODIUM CHLORIDE 0.9 % IV SOLN: 250.0000 mL | INTRAVENOUS | Status: DC | PRN

## 2022-08-13 MED ORDER — SODIUM CHLORIDE 0.9 % WEIGHT BASED INFUSION
3.0000 mL/kg/h | INTRAVENOUS | Status: AC
  Administered 2022-08-13: 3 mL/kg/h via INTRAVENOUS

## 2022-08-13 MED ORDER — LIDOCAINE HCL (PF) 1 % IJ SOLN
INTRAMUSCULAR | Status: AC
  Filled 2022-08-13: qty 30

## 2022-08-13 MED ORDER — VERAPAMIL HCL 2.5 MG/ML IV SOLN
INTRAVENOUS | Status: DC | PRN
  Administered 2022-08-13: 10 mL via INTRA_ARTERIAL

## 2022-08-13 MED ORDER — ONDANSETRON HCL 4 MG/2ML IJ SOLN: 4.0000 mg | Freq: Four times a day (QID) | INTRAMUSCULAR | Status: DC | PRN

## 2022-08-13 MED ORDER — HEPARIN (PORCINE) IN NACL 1000-0.9 UT/500ML-% IV SOLN
INTRAVENOUS | Status: DC | PRN
  Administered 2022-08-13 (×2): 500 mL

## 2022-08-13 SURGICAL SUPPLY — 15 items
BAND CMPR LRG ZPHR (HEMOSTASIS) ×1
BAND ZEPHYR COMPRESS 30 LONG (HEMOSTASIS) IMPLANT
CATH 5FR JL3.5 JR4 ANG PIG MP (CATHETERS) IMPLANT
CATH INFINITI 5 FR 3DRC (CATHETERS) IMPLANT
CATH LAUNCHER 5F EBU3.5 (CATHETERS) IMPLANT
CATH LAUNCHER 6FR EBU 3 (CATHETERS) IMPLANT
GLIDESHEATH SLEND SS 6F .021 (SHEATH) IMPLANT
GUIDEWIRE INQWIRE 1.5J.035X260 (WIRE) IMPLANT
GUIDEWIRE PRESSURE X 175 (WIRE) IMPLANT
INQWIRE 1.5J .035X260CM (WIRE) ×2
KIT ESSENTIALS PG (KITS) IMPLANT
KIT HEART LEFT (KITS) ×1 IMPLANT
PACK CARDIAC CATHETERIZATION (CUSTOM PROCEDURE TRAY) ×1 IMPLANT
TRANSDUCER W/STOPCOCK (MISCELLANEOUS) ×1 IMPLANT
TUBING CIL FLEX 10 FLL-RA (TUBING) ×1 IMPLANT

## 2022-08-13 NOTE — Interval H&P Note (Signed)
History and Physical Interval Note:  08/13/2022 7:21 AM  Tracy Oconnor  has presented today for surgery, with the diagnosis of shortness of breath and abnormal stress test.  The various methods of treatment have been discussed with the patient and family. After consideration of risks, benefits and other options for treatment, the patient has consented to  Procedure(s): LEFT HEART CATH AND CORONARY ANGIOGRAPHY (N/A) as a surgical intervention.  The patient's history has been reviewed, patient examined, no change in status, stable for surgery.  I have reviewed the patient's chart and labs.  Questions were answered to the patient's satisfaction.    Cath Lab Visit (complete for each Cath Lab visit)  Clinical Evaluation Leading to the Procedure:   ACS: No.  Non-ACS:    Anginal Classification: CCS III (shortness of breath - ? anginal equivalent)  Anti-ischemic medical therapy: No Therapy  Non-Invasive Test Results: Intermediate-risk stress test findings: cardiac mortality 1-3%/year  Prior CABG: No previous CABG  Diara Chaudhari

## 2022-08-13 NOTE — Discharge Instructions (Signed)

## 2022-08-13 NOTE — Progress Notes (Signed)
Patient given discharge instructions to patient and friend. Verbal understanding

## 2022-08-14 ENCOUNTER — Encounter (HOSPITAL_COMMUNITY): Payer: Self-pay | Admitting: Internal Medicine

## 2022-08-14 ENCOUNTER — Telehealth: Payer: Self-pay | Admitting: Internal Medicine

## 2022-08-14 DIAGNOSIS — S5011XA Contusion of right forearm, initial encounter: Secondary | ICD-10-CM | POA: Diagnosis not present

## 2022-08-14 NOTE — Telephone Encounter (Signed)
Called the patient and she reported that the insertion site where she had her cardiac catheterization on 08/13/22 was very bruised and red. She questioned whether this was normal? She also reported that the site of the insertion used for the cardiac cath on her right wrist was not hard and tender. Spoke with Dr. Tomie China and he recommended that the patient go to the ER or urgent care to have her wrist and arm evaluated. I relayed this information to the patient and she agreed and stated that she would go to the ER. Patient had no further questions at this time.

## 2022-08-14 NOTE — Telephone Encounter (Signed)
Pt states she is having discoloration at the site of where she had her procedure done yesterday. She wants to know if this is normal. Please advise.

## 2022-08-17 ENCOUNTER — Other Ambulatory Visit: Payer: Self-pay

## 2022-08-17 MED ORDER — ISOSORBIDE MONONITRATE ER 30 MG PO TB24: 15.0000 mg | ORAL_TABLET | Freq: Every day | ORAL | 3 refills | Status: DC

## 2022-08-19 ENCOUNTER — Ambulatory Visit
Admission: RE | Admit: 2022-08-19 | Discharge: 2022-08-19 | Disposition: A | Discharge status: Home / Self Care | Payer: Medicare HMO | Source: Ambulatory Visit | Attending: Family Medicine | Admitting: Family Medicine

## 2022-08-19 DIAGNOSIS — Z1231 Encounter for screening mammogram for malignant neoplasm of breast: Secondary | ICD-10-CM

## 2022-08-19 DIAGNOSIS — M25551 Pain in right hip: Secondary | ICD-10-CM | POA: Diagnosis not present

## 2022-08-19 DIAGNOSIS — Z9181 History of falling: Secondary | ICD-10-CM | POA: Diagnosis not present

## 2022-08-20 ENCOUNTER — Encounter: Payer: Self-pay | Admitting: Family Medicine

## 2022-08-20 DIAGNOSIS — G8929 Other chronic pain: Secondary | ICD-10-CM | POA: Diagnosis not present

## 2022-08-20 DIAGNOSIS — M5416 Radiculopathy, lumbar region: Secondary | ICD-10-CM | POA: Diagnosis not present

## 2022-08-20 DIAGNOSIS — M48061 Spinal stenosis, lumbar region without neurogenic claudication: Secondary | ICD-10-CM | POA: Diagnosis not present

## 2022-08-26 ENCOUNTER — Ambulatory Visit (INDEPENDENT_AMBULATORY_CARE_PROVIDER_SITE_OTHER): Payer: Medicare HMO

## 2022-08-26 ENCOUNTER — Ambulatory Visit: Payer: Medicare HMO | Attending: Cardiology

## 2022-08-26 DIAGNOSIS — R0989 Other specified symptoms and signs involving the circulatory and respiratory systems: Secondary | ICD-10-CM

## 2022-08-26 DIAGNOSIS — R0609 Other forms of dyspnea: Secondary | ICD-10-CM

## 2022-08-26 DIAGNOSIS — I2510 Atherosclerotic heart disease of native coronary artery without angina pectoris: Secondary | ICD-10-CM

## 2022-08-26 LAB — ECHOCARDIOGRAM COMPLETE
MV M vel: 4.5 m/s
MV Peak grad: 81 mmHg
Radius: 0.85 cm
S' Lateral: 3.1 cm

## 2022-08-26 NOTE — Progress Notes (Unsigned)
Cardiology Office Note:  .   Date:  08/27/2022  ID:  Tracy Oconnor, DOB 1960-03-09, MRN [id number] PCP: Abner Greenspan, MD  [facility] Providers Cardiologist:  None    History of Present Illness: .   Tracy Oconnor is a 62 y.o. female with a past medical history of hypertension, aortic atherosclerosis noted on CT imaging, COPD oxygen dependent, OSA, palpitations, bipolar disorder, hyperlipidemia, former smoker cessation 2022.  08/26/2022 echo EF 55 to 60%, no RWMA, mild concentric LVH, grade 1 DD, moderate MR, mild AR 08/26/2022 abdominal ultrasound 2.2 cm dilation of the proximal abdominal aorta 08/13/2022 left heart cath severe single-vessel CAD with CTO of distal RCA with left-right collaterals, moderate LCA disease  She established care with Dr. Tomie China on 07/29/2022 at the behest of her pulmonologist for DOE and CAD.  She underwent a Lexiscan stress evaluation which revealed global function was mildly reduced, EF 49%, findings consistent with ischemia.  An echocardiogram, AAA duplex were arranged but have not yet been completed.  She was evaluated following her abnormal stress evaluation.  The decision was made to proceed with a left heart catheterization.  She underwent this on 08/13/2022 which revealed severe single-vessel CAD with CTO of the distal RCA with left-to-right collaterals, moderate LCA disease with 60% mid LAD stenosis.  She was started on isosorbide.  She presents today for follow up after recent left heart catheterization.  She is feeling no worse, no better.  She was started on isosorbide however has not noticed any improvement.  She is also very sedentary secondary to her COPD disease and also debilitating back and hip pain. She has some pedal edema.  She denies chest pain, palpitations, dyspnea, pnd, orthopnea, n, v, dizziness, syncope, weight gain, or early satiety.    ROS: Review of Systems  Constitutional:  Positive for malaise/fatigue.  HENT: Negative.     Eyes: Negative.   Respiratory:  Positive for shortness of breath.   Cardiovascular: Negative.   Gastrointestinal: Negative.   Genitourinary: Negative.   Musculoskeletal: Negative.   Skin: Negative.   Neurological: Negative.   Endo/Heme/Allergies:  Bruises/bleeds easily.  Psychiatric/Behavioral: Negative.      Studies Reviewed: .        Cardiac Studies & Procedures   CARDIAC CATHETERIZATION  CARDIAC CATHETERIZATION 08/13/2022  Narrative Conclusions: Severe single-vessel coronary artery disease with chronic total occlusion of distal RCA with robust left-to-right collaterals. Moderate left coronary artery disease with 60% mid LAD stenosis (RFR borderline at 0.90) and 50% D2 and OM2 stenoses. Normal left ventricular systolic function (LVEF 55-65%) with mildly elevated filling pressure (LVEDP 18 mmHg).  Recommendations: Add isosorbide mononitrate for antianginal therapy. Aggressive secondary prevention of coronary artery disease.  Yvonne Kendall, MD [facility]  Findings Coronary Findings Diagnostic  Dominance: Right  Left Main Vessel is large. Vessel is angiographically normal.  Left Anterior Descending Mid LAD lesion is 60% stenosed. Pressure gradient was performed on the lesion using a GUIDEWIRE PRESSURE X 175 and CATH LAUNCHER 11F EBU3.5. RFR: 0.9.  First Diagonal Branch Vessel is moderate in size.  Second Diagonal Branch Vessel is moderate in size. 2nd Diag lesion is 50% stenosed.  Left Circumflex Vessel is moderate in size.  First Obtuse Marginal Branch Vessel is moderate in size.  Second Obtuse Marginal Branch Vessel is moderate in size. 2nd Mrg lesion is 50% stenosed.  Third Obtuse Marginal Branch Vessel is small in size.  Right Coronary Artery Vessel is moderate in size. Mid RCA lesion is 60% stenosed.  Dist RCA-1 lesion is 100% stenosed. The lesion is chronically occluded with bridging collateral flow. Dist RCA-2 lesion is 100% stenosed. The  lesion is chronically occluded with left-to-right collateral flow.  Right Posterior Descending Artery Collaterals RPDA filled by collaterals from Dist LAD.  First Right Posterolateral Branch Collaterals 1st RPL filled by collaterals from 2nd Sept.  Intervention  No interventions have been documented.   STRESS TESTS  MYOCARDIAL PERFUSION IMAGING 07/30/2022  Narrative   Findings are consistent with ischemia involving basal, mid and apical portion of the inferior wall. The study is intermediate risk secondary to mildly diminished EF.   No ST deviation was noted.   Left ventricular function is abnormal. Global function is mildly reduced. Nuclear stress EF: 49 %. The left ventricular ejection fraction is mildly decreased (45-54%). End diastolic cavity size is normal.   Prior study not available for comparison.   ECHOCARDIOGRAM  ECHOCARDIOGRAM COMPLETE 08/26/2022  Narrative ECHOCARDIOGRAM REPORT    Patient Name:   Tracy Oconnor Date of Exam: 08/26/2022 Medical Rec #:  [id number]        Height:       60.0 in Accession #:    [id number]       Weight:       166.0 lb Date of Birth:  01/08/1961        BSA:          1.724 m Patient Age:    62 years         BP:           140/5988 mmHg Patient Gender: F                HR:           62 bpm. Exam Location:  [facility]  Procedure: 2D Echo, Cardiac Doppler, Color Doppler and Strain Analysis  Indications:    DOE (dyspnea on exertion) [R06.09 (ICD-10-CM)]; Coronary artery disease involving native coronary artery of native heart without angina pectoris [I25.10 (ICD-10-CM)]  History:        Patient has no prior history of Echocardiogram examinations. Risk Factors:Hypertension and Dyslipidemia.  Sonographer:    Louie Boston RDCS Referring Phys: Rito Ehrlich [facility]  IMPRESSIONS   1. Left ventricular ejection fraction, by estimation, is 55 to 60%. The left ventricle has normal function. The left ventricle has no regional wall motion  abnormalities. There is mild concentric left ventricular hypertrophy. Left ventricular diastolic parameters are consistent with Grade I diastolic dysfunction (impaired relaxation). The average left ventricular global longitudinal strain is -13.8 %. The global longitudinal strain is abnormal. 2. Right ventricular systolic function is mildly reduced. The right ventricular size is normal. Mildly increased right ventricular wall thickness. Tricuspid regurgitation signal is inadequate for assessing PA pressure. 3. The mitral valve is degenerative. Moderate mitral valve regurgitation. No evidence of mitral stenosis. 4. The aortic valve is tricuspid. Aortic valve regurgitation is mild. No aortic stenosis is present. 5. Aortic Normal DTA.  FINDINGS Left Ventricle: Left ventricular ejection fraction, by estimation, is 55 to 60%. The left ventricle has normal function. The left ventricle has no regional wall motion abnormalities. The average left ventricular global longitudinal strain is -13.8 %. The global longitudinal strain is abnormal. The left ventricular internal cavity size was normal in size. There is mild concentric left ventricular hypertrophy. Left ventricular diastolic parameters are consistent with Grade I diastolic dysfunction (impaired relaxation). Indeterminate filling pressures.  Right Ventricle: The right ventricular size is normal. Mildly increased right ventricular wall  thickness. Right ventricular systolic function is mildly reduced. Tricuspid regurgitation signal is inadequate for assessing PA pressure.  Left Atrium: Left atrial size was normal in size.  Right Atrium: Right atrial size was normal in size.  Pericardium: There is no evidence of pericardial effusion. Presence of epicardial fat layer.  Mitral Valve: The mitral valve is degenerative in appearance. Mild mitral annular calcification. Moderate mitral valve regurgitation. No evidence of mitral valve stenosis.  Tricuspid  Valve: The tricuspid valve is normal in structure. Tricuspid valve regurgitation is trivial. No evidence of tricuspid stenosis.  Aortic Valve: The aortic valve is tricuspid. Aortic valve regurgitation is mild. No aortic stenosis is present.  Pulmonic Valve: The pulmonic valve was normal in structure. Pulmonic valve regurgitation is trivial. No evidence of pulmonic stenosis.  Aorta: The aortic root and ascending aorta are structurally normal, with no evidence of dilitation, the aortic arch was not well visualized and Normal DTA.  Venous: The pulmonary veins were not well visualized. The inferior vena cava was not well visualized.  IAS/Shunts: No atrial level shunt detected by color flow Doppler.   LEFT VENTRICLE PLAX 2D LVIDd:         4.40 cm   Diastology LVIDs:         3.10 cm   LV e' medial:    4.03 cm/s LV PW:         1.20 cm   LV E/e' medial:  16.1 LV IVS:        1.10 cm   LV e' lateral:   6.53 cm/s LVOT diam:     2.00 cm   LV E/e' lateral: 9.9 LV SV:         65 LV SV Index:   38        2D Longitudinal Strain LVOT Area:     3.14 cm  2D Strain GLS Avg:     -13.8 %   RIGHT VENTRICLE            IVC RV Basal diam:  2.70 cm    IVC diam: 1.30 cm RV S prime:     9.68 cm/s TAPSE (M-mode): 1.6 cm  LEFT ATRIUM             Index        RIGHT ATRIUM          Index LA diam:        3.60 cm 2.09 cm/m   RA Area:     9.79 cm LA Vol (A2C):   25.5 ml 14.79 ml/m  RA Volume:   19.40 ml 11.25 ml/m LA Vol (A4C):   23.1 ml 13.40 ml/m LA Biplane Vol: 24.3 ml 14.09 ml/m AORTIC VALVE LVOT Vmax:   103.25 cm/s LVOT Vmean:  63.150 cm/s LVOT VTI:    0.206 m  AORTA Ao Root diam: 2.80 cm Ao Asc diam:  3.50 cm Ao Desc diam: 2.40 cm  MR Peak grad:    81.0 mmHg    TRICUSPID VALVE MR Mean grad:    49.0 mmHg    TR Peak grad:   8.1 mmHg MR Vmax:         450.00 cm/s  TR Vmax:        142.00 cm/s MR Vmean:        322.0 cm/s MR PISA:         4.54 cm     SHUNTS MR PISA Eff ROA: 39 mm       Systemic  VTI:  0.21 m MR PISA Radius:  0.85 cm      Systemic Diam: 2.00 cm MV E velocity: 64.75 cm/s MV A velocity: 83.85 cm/s MV E/A ratio:  0.77  Norman Herrlich MD Electronically signed by Norman Herrlich MD Signature Date/Time: 08/26/2022/12:37:05 PM    Final             Risk Assessment/Calculations:             Physical Exam:   VS:  BP 132/80 (BP Location: Left Arm, Patient Position: Sitting, Cuff Size: Normal)   Pulse 84   Ht 5' (1.524 m)   Wt 163 lb (73.9 kg)   SpO2 90%   BMI 31.83 kg/m    Wt Readings from Last 3 Encounters:  08/27/22 163 lb (73.9 kg)  08/13/22 166 lb (75.3 kg)  08/11/22 166 lb 12.8 oz (75.7 kg)    GEN: chronically ill-appearing, no acute distress NECK: No JVD; No carotid bruits CARDIAC: RRR, no murmurs, rubs, gallops RESPIRATORY:  diminished in bases, expiratory wheezing ABDOMEN: Soft, non-tender, non-distended EXTREMITIES:  +2 pedal edema; No deformity; right wrist cath site healing well, bruising noted  ASSESSMENT AND PLAN: .   Coronary artery disease -left heart cath revealed severe single-vessel CAD with CTO of distal RCA with left-right collaterals, moderate LCA disease. Stable with no anginal symptoms. No indication for ischemic evaluation.  Continue aspirin 81 mg daily, continue Lipitor 20 mg daily, continue isosorbide 30 mg daily, continue nitroglycerin as needed.  Pedal edema - sometimes bothersome for her, recent echo ok, she is on gabapentin which may be contributory to this. Will giver her torsemide 10 mg PRN for pedal edema.   SOB/DOE-multifactorial most likely related to her COPD and CAD, she is at her baseline with her breathing today. Reviewed how and when to take. Most recent Creatinine 0.80 two weeks ago.   Hyperlipidemia/elevated LPa-most recent LDL was well-controlled at 62 on 08/12/2022, continue Lipitor 20 mg daily.  Hypertension-blood pressure is controlled at 132/80. Continue losartan 25 mg daily.   COPD -former smoker complete  cessation x 2 years, followed by Dr. Winfred Leeds.  She is on oxygen 3 L continuous.  Moderate mitral regurgitation-noted on most recent echo, she is short of breath however she has advanced COPD.      Dispo: Torsemide 10 mg PRN for pedal edema. Follow-up in 3 months with Dr. Tomie China.  Signed, Flossie Dibble, NP

## 2022-08-27 ENCOUNTER — Encounter: Payer: Self-pay | Admitting: Cardiology

## 2022-08-27 ENCOUNTER — Ambulatory Visit: Payer: Medicare HMO | Attending: Cardiology | Admitting: Cardiology

## 2022-08-27 VITALS — BP 132/80 | HR 84 | Ht 60.0 in | Wt 163.0 lb

## 2022-08-27 DIAGNOSIS — R011 Cardiac murmur, unspecified: Secondary | ICD-10-CM

## 2022-08-27 DIAGNOSIS — R600 Localized edema: Secondary | ICD-10-CM

## 2022-08-27 DIAGNOSIS — I10 Essential (primary) hypertension: Secondary | ICD-10-CM | POA: Diagnosis not present

## 2022-08-27 DIAGNOSIS — I2510 Atherosclerotic heart disease of native coronary artery without angina pectoris: Secondary | ICD-10-CM

## 2022-08-27 DIAGNOSIS — R0609 Other forms of dyspnea: Secondary | ICD-10-CM | POA: Diagnosis not present

## 2022-08-27 DIAGNOSIS — E782 Mixed hyperlipidemia: Secondary | ICD-10-CM

## 2022-08-27 DIAGNOSIS — E7841 Elevated Lipoprotein(a): Secondary | ICD-10-CM | POA: Diagnosis not present

## 2022-08-27 MED ORDER — TORSEMIDE 10 MG PO TABS: 10.0000 mg | ORAL_TABLET | ORAL | 6 refills | Status: DC | PRN

## 2022-08-27 NOTE — Patient Instructions (Addendum)
Medication Instructions:  Your physician has recommended you make the following change in your medication:   Take Torsemide 10 mg as needed for pedal edema.   *If you need a refill on your cardiac medications before your next appointment, please call your pharmacy*   Lab Work: None ordered If you have labs (blood work) drawn today and your tests are completely normal, you will receive your results only by: MyChart Message (if you have MyChart) OR A paper copy in the mail If you have any lab test that is abnormal or we need to change your treatment, we will call you to review the results.   Testing/Procedures: None ordered   Follow-Up: At [facility], you and your health needs are our priority.  As part of our continuing mission to provide you with exceptional heart care, we have created designated Provider Care Teams.  These Care Teams include your primary Cardiologist (physician) and Advanced Practice Providers (APPs -  Physician Assistants and Nurse Practitioners) who all work together to provide you with the care you need, when you need it.  We recommend signing up for the patient portal called "MyChart".  Sign up information is provided on this After Visit Summary.  MyChart is used to connect with patients for Virtual Visits (Telemedicine).  Patients are able to view lab/test results, encounter notes, upcoming appointments, etc.  Non-urgent messages can be sent to your provider as well.   To learn more about what you can do with MyChart, go to [email or web address].    Your next appointment:   3 month(s)  The format for your next appointment:   In Person  Provider:   Belva Crome, MD    Other Instructions none  Important Information About Sugar

## 2022-09-03 DIAGNOSIS — K567 Ileus, unspecified: Secondary | ICD-10-CM | POA: Diagnosis not present

## 2022-09-03 DIAGNOSIS — J449 Chronic obstructive pulmonary disease, unspecified: Secondary | ICD-10-CM | POA: Diagnosis not present

## 2022-09-07 DIAGNOSIS — E785 Hyperlipidemia, unspecified: Secondary | ICD-10-CM | POA: Diagnosis not present

## 2022-09-08 DIAGNOSIS — R5383 Other fatigue: Secondary | ICD-10-CM | POA: Diagnosis not present

## 2022-09-08 DIAGNOSIS — R400 Somnolence: Secondary | ICD-10-CM | POA: Diagnosis not present

## 2022-09-08 DIAGNOSIS — G4736 Sleep related hypoventilation in conditions classified elsewhere: Secondary | ICD-10-CM | POA: Diagnosis not present

## 2022-09-08 DIAGNOSIS — G4733 Obstructive sleep apnea (adult) (pediatric): Secondary | ICD-10-CM | POA: Diagnosis not present

## 2022-09-08 DIAGNOSIS — Z87891 Personal history of nicotine dependence: Secondary | ICD-10-CM | POA: Diagnosis not present

## 2022-09-08 DIAGNOSIS — J449 Chronic obstructive pulmonary disease, unspecified: Secondary | ICD-10-CM | POA: Diagnosis not present

## 2022-09-08 DIAGNOSIS — I2510 Atherosclerotic heart disease of native coronary artery without angina pectoris: Secondary | ICD-10-CM | POA: Diagnosis not present

## 2022-09-09 ENCOUNTER — Other Ambulatory Visit (HOSPITAL_BASED_OUTPATIENT_CLINIC_OR_DEPARTMENT_OTHER): Payer: Self-pay | Admitting: *Deleted

## 2022-09-09 MED ORDER — TORSEMIDE 10 MG PO TABS: 10.0000 mg | ORAL_TABLET | ORAL | 0 refills | Status: DC | PRN

## 2022-09-11 ENCOUNTER — Other Ambulatory Visit: Payer: Self-pay

## 2022-09-11 MED ORDER — TORSEMIDE 10 MG PO TABS: 10.0000 mg | ORAL_TABLET | ORAL | 3 refills | Status: DC | PRN

## 2022-09-14 DIAGNOSIS — E785 Hyperlipidemia, unspecified: Secondary | ICD-10-CM | POA: Diagnosis not present

## 2022-09-14 DIAGNOSIS — Z6831 Body mass index (BMI) 31.0-31.9, adult: Secondary | ICD-10-CM | POA: Diagnosis not present

## 2022-09-14 DIAGNOSIS — G8929 Other chronic pain: Secondary | ICD-10-CM | POA: Diagnosis not present

## 2022-09-14 DIAGNOSIS — M545 Low back pain, unspecified: Secondary | ICD-10-CM | POA: Diagnosis not present

## 2022-09-14 DIAGNOSIS — I25119 Atherosclerotic heart disease of native coronary artery with unspecified angina pectoris: Secondary | ICD-10-CM | POA: Diagnosis not present

## 2022-10-04 DIAGNOSIS — F319 Bipolar disorder, unspecified: Secondary | ICD-10-CM | POA: Diagnosis not present

## 2022-10-04 DIAGNOSIS — K567 Ileus, unspecified: Secondary | ICD-10-CM | POA: Diagnosis not present

## 2022-10-27 DIAGNOSIS — H5203 Hypermetropia, bilateral: Secondary | ICD-10-CM | POA: Diagnosis not present

## 2022-10-27 DIAGNOSIS — H353131 Nonexudative age-related macular degeneration, bilateral, early dry stage: Secondary | ICD-10-CM | POA: Diagnosis not present

## 2022-11-02 DIAGNOSIS — G894 Chronic pain syndrome: Secondary | ICD-10-CM | POA: Diagnosis not present

## 2022-11-02 DIAGNOSIS — M179 Osteoarthritis of knee, unspecified: Secondary | ICD-10-CM | POA: Diagnosis not present

## 2022-11-02 DIAGNOSIS — M19049 Primary osteoarthritis, unspecified hand: Secondary | ICD-10-CM | POA: Diagnosis not present

## 2022-11-02 DIAGNOSIS — M19019 Primary osteoarthritis, unspecified shoulder: Secondary | ICD-10-CM | POA: Diagnosis not present

## 2022-11-02 DIAGNOSIS — M2550 Pain in unspecified joint: Secondary | ICD-10-CM | POA: Diagnosis not present

## 2022-11-02 DIAGNOSIS — M169 Osteoarthritis of hip, unspecified: Secondary | ICD-10-CM | POA: Diagnosis not present

## 2022-11-02 DIAGNOSIS — M479 Spondylosis, unspecified: Secondary | ICD-10-CM | POA: Diagnosis not present

## 2022-11-02 DIAGNOSIS — Z1389 Encounter for screening for other disorder: Secondary | ICD-10-CM | POA: Diagnosis not present

## 2022-11-03 DIAGNOSIS — I700 Atherosclerosis of aorta: Secondary | ICD-10-CM | POA: Diagnosis not present

## 2022-11-03 DIAGNOSIS — R32 Unspecified urinary incontinence: Secondary | ICD-10-CM | POA: Diagnosis not present

## 2022-11-03 DIAGNOSIS — J439 Emphysema, unspecified: Secondary | ICD-10-CM | POA: Diagnosis not present

## 2022-11-03 DIAGNOSIS — F319 Bipolar disorder, unspecified: Secondary | ICD-10-CM | POA: Diagnosis not present

## 2022-11-03 DIAGNOSIS — F17210 Nicotine dependence, cigarettes, uncomplicated: Secondary | ICD-10-CM | POA: Diagnosis not present

## 2022-11-03 DIAGNOSIS — Z87891 Personal history of nicotine dependence: Secondary | ICD-10-CM | POA: Diagnosis not present

## 2022-11-03 DIAGNOSIS — K567 Ileus, unspecified: Secondary | ICD-10-CM | POA: Diagnosis not present

## 2022-11-03 DIAGNOSIS — Z122 Encounter for screening for malignant neoplasm of respiratory organs: Secondary | ICD-10-CM | POA: Diagnosis not present

## 2022-11-06 DIAGNOSIS — M1712 Unilateral primary osteoarthritis, left knee: Secondary | ICD-10-CM | POA: Diagnosis not present

## 2022-11-06 DIAGNOSIS — M1711 Unilateral primary osteoarthritis, right knee: Secondary | ICD-10-CM | POA: Diagnosis not present

## 2022-11-11 DIAGNOSIS — Z6829 Body mass index (BMI) 29.0-29.9, adult: Secondary | ICD-10-CM | POA: Diagnosis not present

## 2022-11-11 DIAGNOSIS — Z0000 Encounter for general adult medical examination without abnormal findings: Secondary | ICD-10-CM | POA: Diagnosis not present

## 2022-11-20 DIAGNOSIS — J449 Chronic obstructive pulmonary disease, unspecified: Secondary | ICD-10-CM | POA: Diagnosis not present

## 2022-11-20 DIAGNOSIS — G4736 Sleep related hypoventilation in conditions classified elsewhere: Secondary | ICD-10-CM | POA: Diagnosis not present

## 2022-11-20 DIAGNOSIS — G4733 Obstructive sleep apnea (adult) (pediatric): Secondary | ICD-10-CM | POA: Diagnosis not present

## 2022-11-20 DIAGNOSIS — R400 Somnolence: Secondary | ICD-10-CM | POA: Diagnosis not present

## 2022-12-01 ENCOUNTER — Encounter: Payer: Self-pay | Admitting: Cardiology

## 2022-12-01 ENCOUNTER — Ambulatory Visit: Payer: Medicare HMO | Attending: Cardiology | Admitting: Cardiology

## 2022-12-01 VITALS — BP 118/68 | HR 79 | Ht 59.0 in | Wt 157.0 lb

## 2022-12-01 DIAGNOSIS — I2510 Atherosclerotic heart disease of native coronary artery without angina pectoris: Secondary | ICD-10-CM | POA: Diagnosis not present

## 2022-12-01 DIAGNOSIS — E66811 Obesity, class 1: Secondary | ICD-10-CM | POA: Diagnosis not present

## 2022-12-01 DIAGNOSIS — I10 Essential (primary) hypertension: Secondary | ICD-10-CM

## 2022-12-01 DIAGNOSIS — G4733 Obstructive sleep apnea (adult) (pediatric): Secondary | ICD-10-CM | POA: Diagnosis not present

## 2022-12-01 NOTE — Patient Instructions (Signed)
Medication Instructions:  Your physician recommends that you continue on your current medications as directed. Please refer to the Current Medication list given to you today.  *If you need a refill on your cardiac medications before your next appointment, please call your pharmacy*   Lab Work: None Ordered If you have labs (blood work) drawn today and your tests are completely normal, you will receive your results only by: MyChart Message (if you have MyChart) OR A paper copy in the mail If you have any lab test that is abnormal or we need to change your treatment, we will call you to review the results.   Testing/Procedures: None Ordered   Follow-Up: At [facility], you and your health needs are our priority.  As part of our continuing mission to provide you with exceptional heart care, we have created designated Provider Care Teams.  These Care Teams include your primary Cardiologist (physician) and Advanced Practice Providers (APPs -  Physician Assistants and Nurse Practitioners) who all work together to provide you with the care you need, when you need it.  We recommend signing up for the patient portal called "MyChart".  Sign up information is provided on this After Visit Summary.  MyChart is used to connect with patients for Virtual Visits (Telemedicine).  Patients are able to view lab/test results, encounter notes, upcoming appointments, etc.  Non-urgent messages can be sent to your provider as well.   To learn more about what you can do with MyChart, go to [email or web address].    Your next appointment:   9 month(s)  The format for your next appointment:   In Person  Provider:   Rajan Revankar, MD    Other Instructions NA  

## 2022-12-01 NOTE — Progress Notes (Signed)
Cardiology Office Note:    Date:  12/01/2022   ID:  Tracy Oconnor, DOB 08/01/1960, MRN [id number]  PCP:  Abner Greenspan, MD  Cardiologist:  Garwin Brothers, MD   Referring MD: Abner Greenspan, MD    ASSESSMENT:    1. Coronary artery disease involving native coronary artery of native heart without angina pectoris   2. Primary hypertension   3. OSA (obstructive sleep apnea)   4. Obesity (BMI 30.0-34.9)    PLAN:    In order of problems listed above:  Coronary artery disease: Report discussed with the patient at length.  Secondary prevention stressed.  Importance of compliance with diet and medication stressed any vocalized understanding. Essential hypertension: Blood pressure stable and diet was emphasized. Hyper lipidemia: On lipid-lowering medications.  I told her to do better to be at goal. Obesity: Weight reduction stressed diet emphasized.  She was advised to be ambulatory to the best of her ability and she agrees. Patient will be seen in follow-up appointment in 6 months or earlier if the patient has any concerns.    Medication Adjustments/Labs and Tests Ordered: Current medicines are reviewed at length with the patient today.  Concerns regarding medicines are outlined above.  No orders of the defined types were placed in this encounter.  No orders of the defined types were placed in this encounter.    No chief complaint on file.    History of Present Illness:    Tracy Oconnor is a 62 y.o. female.  Patient has past medical history of coronary artery disease by recent coronary angiography for which medical management was recommended.,  Essential hypertension, dyslipidemia and obesity.  She leads a sedentary lifestyle.  She ambulates with a cane.  She denies any chest pain orthopnea or PND.  At the time of my evaluation, the patient is alert awake oriented and in no distress.  Past Medical History:  Diagnosis Date   Atherosclerosis    Bilateral carpal tunnel  syndrome 01/30/2021   Bipolar disorder (HCC)    Cervical radiculopathy 01/30/2021   COPD (chronic obstructive pulmonary disease) (HCC)    Edema    Elevated lipoprotein A level    2024   Enteritis 12/17/2020   Fatigue    Hyperlipidemia    Hypertension    Intermittent chest pain    Mitral regurgitation    Murmur    OSA (obstructive sleep apnea)    Palpitations     Past Surgical History:  Procedure Laterality Date   CHOLECYSTECTOMY     CORONARY PRESSURE/FFR STUDY N/A 08/13/2022   Procedure: CORONARY PRESSURE/FFR STUDY;  Surgeon: Yvonne Kendall, MD;  Location: [facility]VASIVE [facility];  Service: Cardiovascular;  Laterality: N/A;   LEFT HEART CATH AND CORONARY ANGIOGRAPHY N/A 08/13/2022   Procedure: LEFT HEART CATH AND CORONARY ANGIOGRAPHY;  Surgeon: Yvonne Kendall, MD;  Location: [facility] INVASIVE CV LAB;  Service: Cardiovascular;  Laterality: N/A;   TONSILLECTOMY     TUBAL LIGATION      Current Medications: Current Meds  Medication Sig   albuterol (VENTOLIN HFA) 108 (90 Base) MCG/ACT inhaler Inhale 2 puffs into the lungs every 4 (four) hours as needed for shortness of breath or wheezing.   ARIPiprazole (ABILIFY) 15 MG tablet Take 15 mg by mouth daily.   aspirin EC 81 MG tablet Take 1 tablet (81 mg total) by mouth daily. Swallow whole.   atorvastatin (LIPITOR) 20 MG tablet Take 20 mg by mouth daily.   Cholecalciferol (VITAMIN D-3) 125 MCG (5000  UT) TABS Take 5,000 Units by mouth daily.   citalopram (CELEXA) 40 MG tablet Take 40 mg by mouth daily.   diclofenac (VOLTAREN) 75 MG EC tablet Take 75 mg by mouth 2 (two) times daily.   fluticasone (FLONASE) 50 MCG/ACT nasal spray Place 1 spray into both nostrils daily.   gabapentin (NEURONTIN) 400 MG capsule Take 400 mg by mouth 3 (three) times daily.   isosorbide mononitrate (IMDUR) 30 MG 24 hr tablet Take 0.5 tablets (15 mg total) by mouth daily.   lidocaine (LIDODERM) 5 % Place 1 patch onto the skin daily.   losartan (COZAAR) 25 MG tablet  Take 25 mg by mouth daily.   methocarbamol (ROBAXIN) 500 MG tablet Take 500 mg by mouth 2 (two) times daily.   Multiple Vitamin (MULTIVITAMIN ADULT PO) Take 1 tablet by mouth daily.   nitroGLYCERIN (NITROSTAT) 0.4 MG SL tablet Place 1 tablet (0.4 mg total) under the tongue every 5 (five) minutes as needed. (Patient taking differently: Place 0.4 mg under the tongue every 5 (five) minutes as needed for chest pain.)   OYSTER SHELL CALCIUM PO Take 1 tablet by mouth daily.   QUEtiapine (SEROQUEL) 300 MG tablet Take 300 mg by mouth at bedtime.   torsemide (DEMADEX) 10 MG tablet Take 1 tablet (10 mg total) by mouth as needed (pedal edema).   traZODone (DESYREL) 50 MG tablet Take 100 mg by mouth at bedtime.   TRELEGY ELLIPTA 200-62.5-25 MCG/ACT AEPB Inhale 1 puff into the lungs daily.   [DISCONTINUED] montelukast (SINGULAIR) 10 MG tablet Take 10 mg by mouth at bedtime.     Allergies:   Penicillins   Social History   Socioeconomic History   Marital status: Single    Spouse name: Not on file   Number of children: Not on file   Years of education: Not on file   Highest education level: Not on file  Occupational History   Not on file  Tobacco Use   Smoking status: Former    Types: Cigarettes   Smokeless tobacco: Never  Substance and Sexual Activity   Alcohol use: Not on file   Drug use: Not on file   Sexual activity: Not on file  Other Topics Concern   Not on file  Social History Narrative   Not on file   Social Determinants of Health   Financial Resource Strain: Not on file  Food Insecurity: Not on file  Transportation Needs: Not on file  Physical Activity: Not on file  Stress: Not on file  Social Connections: Not on file     Family History: The patient's family history includes Breast cancer in her maternal aunt; Cancer in her father; Heart disease in her mother.  ROS:   Please see the history of present illness.    All other systems reviewed and are  negative.  EKGs/Labs/Other Studies Reviewed:    The following studies were reviewed today: I discussed my findings with the patient at length.  Coronary angiography report was discussed.   Recent Labs: 08/11/2022: ALT 29; BUN 9; Creatinine, Ser 0.80; Hemoglobin 13.8; Platelets 282; Potassium 4.8; Sodium 141  Recent Lipid Panel    Component Value Date/Time   LDLDIRECT 62 08/11/2022 1039    Physical Exam:    VS:  BP 118/68   Pulse 79   Ht 4\' 11"  (1.499 m)   Wt 157 lb (71.2 kg)   SpO2 96%   BMI 31.71 kg/m     Wt Readings from Last 3 Encounters:  12/01/22 157 lb (71.2 kg)  08/27/22 163 lb (73.9 kg)  08/13/22 166 lb (75.3 kg)     GEN: Patient is in no acute distress HEENT: Normal NECK: No JVD; No carotid bruits LYMPHATICS: No lymphadenopathy CARDIAC: Hear sounds regular, 2/6 systolic murmur at the apex. RESPIRATORY:  Clear to auscultation without rales, wheezing or rhonchi  ABDOMEN: Soft, non-tender, non-distended MUSCULOSKELETAL:  No edema; No deformity  SKIN: Warm and dry NEUROLOGIC:  Alert and oriented x 3 PSYCHIATRIC:  Normal affect   Signed, Garwin Brothers, MD  12/01/2022 9:05 AM    [facility]

## 2022-12-04 DIAGNOSIS — R32 Unspecified urinary incontinence: Secondary | ICD-10-CM | POA: Diagnosis not present

## 2022-12-04 DIAGNOSIS — K567 Ileus, unspecified: Secondary | ICD-10-CM | POA: Diagnosis not present

## 2022-12-04 DIAGNOSIS — F319 Bipolar disorder, unspecified: Secondary | ICD-10-CM | POA: Diagnosis not present

## 2022-12-09 DIAGNOSIS — Z1389 Encounter for screening for other disorder: Secondary | ICD-10-CM | POA: Diagnosis not present

## 2022-12-09 DIAGNOSIS — M19019 Primary osteoarthritis, unspecified shoulder: Secondary | ICD-10-CM | POA: Diagnosis not present

## 2022-12-09 DIAGNOSIS — G894 Chronic pain syndrome: Secondary | ICD-10-CM | POA: Diagnosis not present

## 2022-12-09 DIAGNOSIS — M2550 Pain in unspecified joint: Secondary | ICD-10-CM | POA: Diagnosis not present

## 2022-12-09 DIAGNOSIS — M19049 Primary osteoarthritis, unspecified hand: Secondary | ICD-10-CM | POA: Diagnosis not present

## 2022-12-09 DIAGNOSIS — M179 Osteoarthritis of knee, unspecified: Secondary | ICD-10-CM | POA: Diagnosis not present

## 2022-12-09 DIAGNOSIS — M479 Spondylosis, unspecified: Secondary | ICD-10-CM | POA: Diagnosis not present

## 2022-12-09 DIAGNOSIS — M169 Osteoarthritis of hip, unspecified: Secondary | ICD-10-CM | POA: Diagnosis not present

## 2022-12-22 DIAGNOSIS — R600 Localized edema: Secondary | ICD-10-CM | POA: Diagnosis not present

## 2022-12-22 DIAGNOSIS — R079 Chest pain, unspecified: Secondary | ICD-10-CM | POA: Diagnosis not present

## 2022-12-22 DIAGNOSIS — R059 Cough, unspecified: Secondary | ICD-10-CM | POA: Diagnosis not present

## 2022-12-22 DIAGNOSIS — R0989 Other specified symptoms and signs involving the circulatory and respiratory systems: Secondary | ICD-10-CM | POA: Diagnosis not present

## 2022-12-22 DIAGNOSIS — R609 Edema, unspecified: Secondary | ICD-10-CM | POA: Diagnosis not present

## 2022-12-22 DIAGNOSIS — R0602 Shortness of breath: Secondary | ICD-10-CM | POA: Diagnosis not present

## 2022-12-22 DIAGNOSIS — R140 Abdominal distension (gaseous): Secondary | ICD-10-CM | POA: Diagnosis not present

## 2022-12-22 DIAGNOSIS — J441 Chronic obstructive pulmonary disease with (acute) exacerbation: Secondary | ICD-10-CM | POA: Diagnosis not present

## 2022-12-22 DIAGNOSIS — Z20822 Contact with and (suspected) exposure to covid-19: Secondary | ICD-10-CM | POA: Diagnosis not present

## 2022-12-22 DIAGNOSIS — E871 Hypo-osmolality and hyponatremia: Secondary | ICD-10-CM | POA: Diagnosis not present

## 2022-12-22 DIAGNOSIS — Z79899 Other long term (current) drug therapy: Secondary | ICD-10-CM | POA: Diagnosis not present

## 2022-12-22 DIAGNOSIS — J449 Chronic obstructive pulmonary disease, unspecified: Secondary | ICD-10-CM | POA: Diagnosis not present

## 2022-12-22 LAB — LAB REPORT - SCANNED: EGFR: 60

## 2022-12-25 DIAGNOSIS — G4733 Obstructive sleep apnea (adult) (pediatric): Secondary | ICD-10-CM | POA: Diagnosis not present

## 2022-12-25 DIAGNOSIS — G4736 Sleep related hypoventilation in conditions classified elsewhere: Secondary | ICD-10-CM | POA: Diagnosis not present

## 2022-12-25 DIAGNOSIS — J9611 Chronic respiratory failure with hypoxia: Secondary | ICD-10-CM | POA: Diagnosis not present

## 2022-12-25 DIAGNOSIS — J449 Chronic obstructive pulmonary disease, unspecified: Secondary | ICD-10-CM | POA: Diagnosis not present

## 2022-12-26 NOTE — Progress Notes (Unsigned)
Cardiology Office Note:  .   Date:  12/28/2022  ID:  Tracy Oconnor, DOB Mar 22, 1960, MRN [id number] PCP: Abner Greenspan, MD  [facility] Providers Cardiologist:  None    History of Present Illness: .   Tracy Oconnor is a 62 y.o. female with a past medical history of hypertension, aortic atherosclerosis noted on CT imaging, COPD oxygen dependent, OSA, palpitations, bipolar disorder, hyperlipidemia, former smoker cessation 2022.  08/26/2022 echo EF 55 to 60%, no RWMA, mild concentric LVH, grade 1 DD, moderate MR, mild AR 08/26/2022 abdominal ultrasound 2.2 cm dilation of the proximal abdominal aorta 08/13/2022 left heart cath severe single-vessel CAD with CTO of distal RCA with left-right collaterals, moderate LCA disease  She was evaluated following her abnormal stress evaluation.  The decision was made to proceed with a left heart catheterization.  She underwent this on 08/13/2022 which revealed severe single-vessel CAD with CTO of the distal RCA with left-to-right collaterals, moderate LCA disease with 60% mid LAD stenosis.  She was started on isosorbide.  Evaluated by Dr. Tomie China on 12/01/2022, she was stable from a cardiac perspective and advised to follow-up in 9 months.  She was evaluated at [facility] emergency department on 12/22/2022 after presenting via EMS for shortness of breath and worsening edema.  She was given diuretics and clinically improved.  CT of the chest demonstrated no evidence of PE or pneumonia.  Her Lasix was increased to twice daily, she was given antibiotics and prednisone.  Sodium was 130, Cr 0.80.  She presents today for follow-up after ED visit as outlined above.  She is feeling much better, still bothered by pedal edema but her shortness of breath is returned to baseline.  She is not able to weigh herself daily, does not have a scale. She denies chest pain, palpitations, pnd, orthopnea, n, v, dizziness, syncope, weight gain, or early satiety.   ROS:  Review of Systems  Constitutional:  Positive for malaise/fatigue.  HENT: Negative.    Eyes: Negative.   Respiratory:  Positive for shortness of breath.   Cardiovascular: Negative.   Gastrointestinal: Negative.   Genitourinary: Negative.   Musculoskeletal: Negative.   Skin: Negative.   Neurological: Negative.   Endo/Heme/Allergies:  Bruises/bleeds easily.  Psychiatric/Behavioral: Negative.      Studies Reviewed: .        Cardiac Studies & Procedures   CARDIAC CATHETERIZATION  CARDIAC CATHETERIZATION 08/13/2022  Narrative Conclusions: Severe single-vessel coronary artery disease with chronic total occlusion of distal RCA with robust left-to-right collaterals. Moderate left coronary artery disease with 60% mid LAD stenosis (RFR borderline at 0.90) and 50% D2 and OM2 stenoses. Normal left ventricular systolic function (LVEF 55-65%) with mildly elevated filling pressure (LVEDP 18 mmHg).  Recommendations: Add isosorbide mononitrate for antianginal therapy. Aggressive secondary prevention of coronary artery disease.  Yvonne Kendall, MD [facility]  Findings Coronary Findings Diagnostic  Dominance: Right  Left Main Vessel is large. Vessel is angiographically normal.  Left Anterior Descending Mid LAD lesion is 60% stenosed. Pressure gradient was performed on the lesion using a GUIDEWIRE PRESSURE X 175 and CATH LAUNCHER 25F EBU3.5. RFR: 0.9.  First Diagonal Branch Vessel is moderate in size.  Second Diagonal Branch Vessel is moderate in size. 2nd Diag lesion is 50% stenosed.  Left Circumflex Vessel is moderate in size.  First Obtuse Marginal Branch Vessel is moderate in size.  Second Obtuse Marginal Branch Vessel is moderate in size. 2nd Mrg lesion is 50% stenosed.  Third Obtuse Marginal Branch Vessel  is small in size.  Right Coronary Artery Vessel is moderate in size. Mid RCA lesion is 60% stenosed. Dist RCA-1 lesion is 100% stenosed. The lesion is  chronically occluded with bridging collateral flow. Dist RCA-2 lesion is 100% stenosed. The lesion is chronically occluded with left-to-right collateral flow.  Right Posterior Descending Artery Collaterals RPDA filled by collaterals from Dist LAD.  First Right Posterolateral Branch Collaterals 1st RPL filled by collaterals from 2nd Sept.  Intervention  No interventions have been documented.   STRESS TESTS  MYOCARDIAL PERFUSION IMAGING 07/30/2022  Narrative   Findings are consistent with ischemia involving basal, mid and apical portion of the inferior wall. The study is intermediate risk secondary to mildly diminished EF.   No ST deviation was noted.   Left ventricular function is abnormal. Global function is mildly reduced. Nuclear stress EF: 49 %. The left ventricular ejection fraction is mildly decreased (45-54%). End diastolic cavity size is normal.   Prior study not available for comparison.   ECHOCARDIOGRAM  ECHOCARDIOGRAM COMPLETE 08/26/2022  Narrative ECHOCARDIOGRAM REPORT    Patient Name:   Tracy Oconnor Date of Exam: 08/26/2022 Medical Rec #:  [id number]        Height:       60.0 in Accession #:    [id number]       Weight:       166.0 lb Date of Birth:  1960-10-06        BSA:          1.724 m Patient Age:    62 years         BP:           140/5988 mmHg Patient Gender: F                HR:           62 bpm. Exam Location:  [place]  Procedure: 2D Echo, Cardiac Doppler, Color Doppler and Strain Analysis  Indications:    DOE (dyspnea on exertion) [R06.09 (ICD-10-CM)]; Coronary artery disease involving native coronary artery of native heart without angina pectoris [I25.10 (ICD-10-CM)]  History:        Patient has no prior history of Echocardiogram examinations. Risk Factors:Hypertension and Dyslipidemia.  Sonographer:    Louie Boston RDCS Referring Phys: Rito Ehrlich [facility]  IMPRESSIONS   1. Left ventricular ejection fraction, by estimation, is 55 to 60%.  The left ventricle has normal function. The left ventricle has no regional wall motion abnormalities. There is mild concentric left ventricular hypertrophy. Left ventricular diastolic parameters are consistent with Grade I diastolic dysfunction (impaired relaxation). The average left ventricular global longitudinal strain is -13.8 %. The global longitudinal strain is abnormal. 2. Right ventricular systolic function is mildly reduced. The right ventricular size is normal. Mildly increased right ventricular wall thickness. Tricuspid regurgitation signal is inadequate for assessing PA pressure. 3. The mitral valve is degenerative. Moderate mitral valve regurgitation. No evidence of mitral stenosis. 4. The aortic valve is tricuspid. Aortic valve regurgitation is mild. No aortic stenosis is present. 5. Aortic Normal DTA.  FINDINGS Left Ventricle: Left ventricular ejection fraction, by estimation, is 55 to 60%. The left ventricle has normal function. The left ventricle has no regional wall motion abnormalities. The average left ventricular global longitudinal strain is -13.8 %. The global longitudinal strain is abnormal. The left ventricular internal cavity size was normal in size. There is mild concentric left ventricular hypertrophy. Left ventricular diastolic parameters are consistent with Grade I diastolic dysfunction (  impaired relaxation). Indeterminate filling pressures.  Right Ventricle: The right ventricular size is normal. Mildly increased right ventricular wall thickness. Right ventricular systolic function is mildly reduced. Tricuspid regurgitation signal is inadequate for assessing PA pressure.  Left Atrium: Left atrial size was normal in size.  Right Atrium: Right atrial size was normal in size.  Pericardium: There is no evidence of pericardial effusion. Presence of epicardial fat layer.  Mitral Valve: The mitral valve is degenerative in appearance. Mild mitral annular calcification.  Moderate mitral valve regurgitation. No evidence of mitral valve stenosis.  Tricuspid Valve: The tricuspid valve is normal in structure. Tricuspid valve regurgitation is trivial. No evidence of tricuspid stenosis.  Aortic Valve: The aortic valve is tricuspid. Aortic valve regurgitation is mild. No aortic stenosis is present.  Pulmonic Valve: The pulmonic valve was normal in structure. Pulmonic valve regurgitation is trivial. No evidence of pulmonic stenosis.  Aorta: The aortic root and ascending aorta are structurally normal, with no evidence of dilitation, the aortic arch was not well visualized and Normal DTA.  Venous: The pulmonary veins were not well visualized. The inferior vena cava was not well visualized.  IAS/Shunts: No atrial level shunt detected by color flow Doppler.   LEFT VENTRICLE PLAX 2D LVIDd:         4.40 cm   Diastology LVIDs:         3.10 cm   LV e' medial:    4.03 cm/s LV PW:         1.20 cm   LV E/e' medial:  16.1 LV IVS:        1.10 cm   LV e' lateral:   6.53 cm/s LVOT diam:     2.00 cm   LV E/e' lateral: 9.9 LV SV:         65 LV SV Index:   38        2D Longitudinal Strain LVOT Area:     3.14 cm  2D Strain GLS Avg:     -13.8 %   RIGHT VENTRICLE            IVC RV Basal diam:  2.70 cm    IVC diam: 1.30 cm RV S prime:     9.68 cm/s TAPSE (M-mode): 1.6 cm  LEFT ATRIUM             Index        RIGHT ATRIUM          Index LA diam:        3.60 cm 2.09 cm/m   RA Area:     9.79 cm LA Vol (A2C):   25.5 ml 14.79 ml/m  RA Volume:   19.40 ml 11.25 ml/m LA Vol (A4C):   23.1 ml 13.40 ml/m LA Biplane Vol: 24.3 ml 14.09 ml/m AORTIC VALVE LVOT Vmax:   103.25 cm/s LVOT Vmean:  63.150 cm/s LVOT VTI:    0.206 m  AORTA Ao Root diam: 2.80 cm Ao Asc diam:  3.50 cm Ao Desc diam: 2.40 cm  MR Peak grad:    81.0 mmHg    TRICUSPID VALVE MR Mean grad:    49.0 mmHg    TR Peak grad:   8.1 mmHg MR Vmax:         450.00 cm/s  TR Vmax:        142.00 cm/s MR Vmean:         322.0 cm/s MR PISA:         4.54 cm  SHUNTS MR PISA Eff ROA: 39 mm       Systemic VTI:  0.21 m MR PISA Radius:  0.85 cm      Systemic Diam: 2.00 cm MV E velocity: 64.75 cm/s MV A velocity: 83.85 cm/s MV E/A ratio:  0.77  Norman Herrlich MD Electronically signed by Norman Herrlich MD Signature Date/Time: 08/26/2022/12:37:05 PM    Final             Risk Assessment/Calculations:     HYPERTENSION CONTROL Vitals:   12/28/22 0841 12/28/22 0910  BP: (!) 128/90 (!) 128/90    The patient's blood pressure is elevated above target today.  In order to address the patient's elevated BP: Blood pressure will be monitored at home to determine if medication changes need to be made.          Physical Exam:   VS:  BP (!) 128/90   Pulse 73   Ht 4\' 11"  (1.499 m)   Wt 166 lb 9.6 oz (75.6 kg)   SpO2 96%   BMI 33.65 kg/m    Wt Readings from Last 3 Encounters:  12/28/22 166 lb 9.6 oz (75.6 kg)  12/01/22 157 lb (71.2 kg)  08/27/22 163 lb (73.9 kg)    GEN: chronically ill-appearing, no acute distress NECK: No JVD; No carotid bruits CARDIAC: RRR, no murmurs, rubs, gallops RESPIRATORY:  diminished in bases, expiratory wheezing ABDOMEN: Soft, non-tender, non-distended EXTREMITIES:  +2 pedal edema; No deformity; right wrist cath site healing well, bruising noted  ASSESSMENT AND PLAN: .   Coronary artery disease -left heart cath revealed severe single-vessel CAD with CTO of distal RCA with left-right collaterals, moderate LCA disease. Stable with no anginal symptoms. No indication for ischemic evaluation.  Continue aspirin 81 mg daily, continue Lipitor 20 mg daily, continue isosorbide 30 mg daily, continue nitroglycerin as needed.  Hypertensive heart disease with heart failure - NYHA class II, still volume overloaded but better, will increase her torsemide to 20 mg in am and 10 mg in afternoon. Will plan to add SGLT2 at next OV.   SOB/DOE-multifactorial most likely related to her COPD and CAD,  she is at her baseline with her breathing today. Reviewed how and when to take. Most recent Creatinine 0.80 two weeks ago.   Hyperlipidemia/elevated LPa-most recent LDL was well-controlled at 62 on 08/12/2022, continue Lipitor 20 mg daily.  Hypertension-blood pressure is controlled at 132/80. Continue losartan 25 mg daily.   COPD -former smoker complete cessation x 2 years, followed by Dr. Winfred Leeds.  She is on oxygen 3 L continuous.  Moderate mitral regurgitation-noted on most recent echo, she is short of breath however she has advanced COPD as well.     Dispo: BMET, CBC today, increase torsemide to 20 mg in am and 10 mg in pm, repeat BMET in 1 week. Return in 6 weeks.   Signed, Flossie Dibble, NP

## 2022-12-28 ENCOUNTER — Encounter: Payer: Self-pay | Admitting: Cardiology

## 2022-12-28 ENCOUNTER — Ambulatory Visit: Payer: Medicare HMO | Attending: Cardiology | Admitting: Cardiology

## 2022-12-28 VITALS — BP 128/90 | HR 73 | Ht 59.0 in | Wt 166.6 lb

## 2022-12-28 DIAGNOSIS — I10 Essential (primary) hypertension: Secondary | ICD-10-CM | POA: Diagnosis not present

## 2022-12-28 DIAGNOSIS — I110 Hypertensive heart disease with heart failure: Secondary | ICD-10-CM | POA: Diagnosis not present

## 2022-12-28 DIAGNOSIS — J449 Chronic obstructive pulmonary disease, unspecified: Secondary | ICD-10-CM

## 2022-12-28 DIAGNOSIS — E785 Hyperlipidemia, unspecified: Secondary | ICD-10-CM | POA: Diagnosis not present

## 2022-12-28 DIAGNOSIS — R531 Weakness: Secondary | ICD-10-CM | POA: Diagnosis not present

## 2022-12-28 DIAGNOSIS — Z79899 Other long term (current) drug therapy: Secondary | ICD-10-CM | POA: Diagnosis not present

## 2022-12-28 DIAGNOSIS — I2510 Atherosclerotic heart disease of native coronary artery without angina pectoris: Secondary | ICD-10-CM

## 2022-12-28 DIAGNOSIS — J9610 Chronic respiratory failure, unspecified whether with hypoxia or hypercapnia: Secondary | ICD-10-CM | POA: Diagnosis not present

## 2022-12-28 DIAGNOSIS — Z6831 Body mass index (BMI) 31.0-31.9, adult: Secondary | ICD-10-CM | POA: Diagnosis not present

## 2022-12-28 DIAGNOSIS — Z7689 Persons encountering health services in other specified circumstances: Secondary | ICD-10-CM | POA: Diagnosis not present

## 2022-12-28 MED ORDER — TORSEMIDE 20 MG PO TABS: 20.0000 mg | ORAL_TABLET | ORAL | 3 refills | Status: DC

## 2022-12-28 NOTE — Patient Instructions (Signed)
Medication Instructions:  Your physician has recommended you make the following change in your medication:  Increase Torsemide to 20 mg in the morning and 10 mg in the afternoon  *If you need a refill on your cardiac medications before your next appointment, please call your pharmacy*   Lab Work: Your physician recommends that you return for lab work in: Today for BMP and CBC Come back in 1 week for a BMP Lab opens at 8am. You DO NOT NEED an appointment. Best time to come is between 8am and 11:45 and between 1:30 and 4:30. If you have been asked to fast for your blood work please have nothing to eat or drink after midnight. You may have water.   If you have labs (blood work) drawn today and your tests are completely normal, you will receive your results only by: MyChart Message (if you have MyChart) OR A paper copy in the mail If you have any lab test that is abnormal or we need to change your treatment, we will call you to review the results.   Testing/Procedures: NONE   Follow-Up: At [facility], you and your health needs are our priority.  As part of our continuing mission to provide you with exceptional heart care, we have created designated Provider Care Teams.  These Care Teams include your primary Cardiologist (physician) and Advanced Practice Providers (APPs -  Physician Assistants and Nurse Practitioners) who all work together to provide you with the care you need, when you need it.  We recommend signing up for the patient portal called "MyChart".  Sign up information is provided on this After Visit Summary.  MyChart is used to connect with patients for Virtual Visits (Telemedicine).  Patients are able to view lab/test results, encounter notes, upcoming appointments, etc.  Non-urgent messages can be sent to your provider as well.   To learn more about what you can do with MyChart, go to [email or web address].    Your next appointment:   6 week(s)  Provider:    Wallis Bamberg, NP Rosalita Levan)    Other Instructions

## 2022-12-29 ENCOUNTER — Telehealth: Payer: Self-pay | Admitting: *Deleted

## 2022-12-29 DIAGNOSIS — I10 Essential (primary) hypertension: Secondary | ICD-10-CM

## 2022-12-29 DIAGNOSIS — Z79899 Other long term (current) drug therapy: Secondary | ICD-10-CM

## 2022-12-29 DIAGNOSIS — I110 Hypertensive heart disease with heart failure: Secondary | ICD-10-CM

## 2022-12-29 LAB — BASIC METABOLIC PANEL WITH GFR
BUN/Creatinine Ratio: 16 (ref 12–28)
BUN: 14 mg/dL (ref 8–27)
CO2: 26 mmol/L (ref 20–29)
Calcium: 10.8 mg/dL — ABNORMAL HIGH (ref 8.7–10.3)
Chloride: 99 mmol/L (ref 96–106)
Creatinine, Ser: 0.87 mg/dL (ref 0.57–1.00)
Glucose: 70 mg/dL (ref 70–99)
Potassium: 4.7 mmol/L (ref 3.5–5.2)
Sodium: 137 mmol/L (ref 134–144)
eGFR: 75 mL/min/1.73

## 2022-12-29 LAB — CBC WITH DIFFERENTIAL/PLATELET
Basophils Absolute: 0 x10E3/uL (ref 0.0–0.2)
Basos: 0 %
EOS (ABSOLUTE): 0.1 x10E3/uL (ref 0.0–0.4)
Eos: 1 %
Hematocrit: 38.3 % (ref 34.0–46.6)
Hemoglobin: 12.5 g/dL (ref 11.1–15.9)
Immature Grans (Abs): 0.1 x10E3/uL (ref 0.0–0.1)
Immature Granulocytes: 1 %
Lymphocytes Absolute: 1.8 x10E3/uL (ref 0.7–3.1)
Lymphs: 17 %
MCH: 31.8 pg (ref 26.6–33.0)
MCHC: 32.6 g/dL (ref 31.5–35.7)
MCV: 98 fL — ABNORMAL HIGH (ref 79–97)
Monocytes Absolute: 0.6 x10E3/uL (ref 0.1–0.9)
Monocytes: 5 %
Neutrophils Absolute: 8.1 x10E3/uL — ABNORMAL HIGH (ref 1.4–7.0)
Neutrophils: 76 %
Platelets: 433 x10E3/uL (ref 150–450)
RBC: 3.93 x10E6/uL (ref 3.77–5.28)
RDW: 14.8 % (ref 11.7–15.4)
WBC: 10.6 x10E3/uL (ref 3.4–10.8)

## 2022-12-29 MED ORDER — EMPAGLIFLOZIN 10 MG PO TABS: 10.0000 mg | ORAL_TABLET | Freq: Every day | ORAL | 3 refills | Status: DC

## 2022-12-29 NOTE — Telephone Encounter (Signed)
Spoke with pt and she agrees to take the [place] and will come back around 12/10 to repeat BMP. Pt verbalized understanding and had no further questions.

## 2022-12-29 NOTE — Telephone Encounter (Signed)
-----   Message from Flossie Dibble sent at 12/29/2022  8:30 AM EST ----- Labs look good.  We need to start on Jardiance 10 mg daily, this will help with her heart failure.  Please review with her good hygiene as it pulls sugar from her bladder so she needs to maintain good personal hygiene. Once we get her on this, we may be able to cut back on her Demadex.   Repeat BMET in 2 weeks.

## 2022-12-29 NOTE — Telephone Encounter (Signed)
Left message for pt to call back about blood work results and need for new medication.

## 2023-01-03 DIAGNOSIS — F319 Bipolar disorder, unspecified: Secondary | ICD-10-CM | POA: Diagnosis not present

## 2023-01-03 DIAGNOSIS — K567 Ileus, unspecified: Secondary | ICD-10-CM | POA: Diagnosis not present

## 2023-01-04 DIAGNOSIS — F319 Bipolar disorder, unspecified: Secondary | ICD-10-CM | POA: Diagnosis not present

## 2023-01-04 DIAGNOSIS — Z79899 Other long term (current) drug therapy: Secondary | ICD-10-CM | POA: Diagnosis not present

## 2023-01-04 DIAGNOSIS — E785 Hyperlipidemia, unspecified: Secondary | ICD-10-CM | POA: Diagnosis not present

## 2023-01-04 DIAGNOSIS — I2510 Atherosclerotic heart disease of native coronary artery without angina pectoris: Secondary | ICD-10-CM | POA: Diagnosis not present

## 2023-01-04 DIAGNOSIS — I10 Essential (primary) hypertension: Secondary | ICD-10-CM | POA: Diagnosis not present

## 2023-01-04 DIAGNOSIS — R32 Unspecified urinary incontinence: Secondary | ICD-10-CM | POA: Diagnosis not present

## 2023-01-04 DIAGNOSIS — J449 Chronic obstructive pulmonary disease, unspecified: Secondary | ICD-10-CM | POA: Diagnosis not present

## 2023-01-05 ENCOUNTER — Telehealth: Payer: Self-pay | Admitting: *Deleted

## 2023-01-05 LAB — BASIC METABOLIC PANEL WITH GFR
BUN/Creatinine Ratio: 16 (ref 12–28)
BUN: 16 mg/dL (ref 8–27)
CO2: 26 mmol/L (ref 20–29)
Calcium: 9.1 mg/dL (ref 8.7–10.3)
Chloride: 98 mmol/L (ref 96–106)
Creatinine, Ser: 0.99 mg/dL (ref 0.57–1.00)
Glucose: 84 mg/dL (ref 70–99)
Potassium: 4.2 mmol/L (ref 3.5–5.2)
Sodium: 137 mmol/L (ref 134–144)
eGFR: 64 mL/min/1.73

## 2023-01-05 NOTE — Telephone Encounter (Signed)
-----   Message from Flossie Dibble sent at 01/05/2023  9:52 AM EST ----- Labs look good!

## 2023-01-05 NOTE — Telephone Encounter (Signed)
INformed tp of normal lab results. Pt verbalized understanding and had no further questions.

## 2023-01-06 DIAGNOSIS — G894 Chronic pain syndrome: Secondary | ICD-10-CM | POA: Diagnosis not present

## 2023-01-06 DIAGNOSIS — M169 Osteoarthritis of hip, unspecified: Secondary | ICD-10-CM | POA: Diagnosis not present

## 2023-01-06 DIAGNOSIS — M479 Spondylosis, unspecified: Secondary | ICD-10-CM | POA: Diagnosis not present

## 2023-01-06 DIAGNOSIS — M19019 Primary osteoarthritis, unspecified shoulder: Secondary | ICD-10-CM | POA: Diagnosis not present

## 2023-01-06 DIAGNOSIS — M179 Osteoarthritis of knee, unspecified: Secondary | ICD-10-CM | POA: Diagnosis not present

## 2023-01-06 DIAGNOSIS — M2550 Pain in unspecified joint: Secondary | ICD-10-CM | POA: Diagnosis not present

## 2023-01-06 DIAGNOSIS — M19049 Primary osteoarthritis, unspecified hand: Secondary | ICD-10-CM | POA: Diagnosis not present

## 2023-01-06 DIAGNOSIS — Z1389 Encounter for screening for other disorder: Secondary | ICD-10-CM | POA: Diagnosis not present

## 2023-01-09 IMAGING — MG MM DIGITAL SCREENING BILAT W/ TOMO AND CAD
8 series · 9 of 24 positions shown · non-contrast
Comparison: Previous exam(s).

CLINICAL DATA: Screening.

EXAM:
DIGITAL SCREENING BILATERAL MAMMOGRAM WITH TOMOSYNTHESIS AND CAD
TECHNIQUE: Bilateral screening digital craniocaudal and mediolateral oblique
mammograms were obtained. Bilateral screening digital breast
tomosynthesis was performed. The images were evaluated with
computer-aided detection.

[L MLO synth-2D]
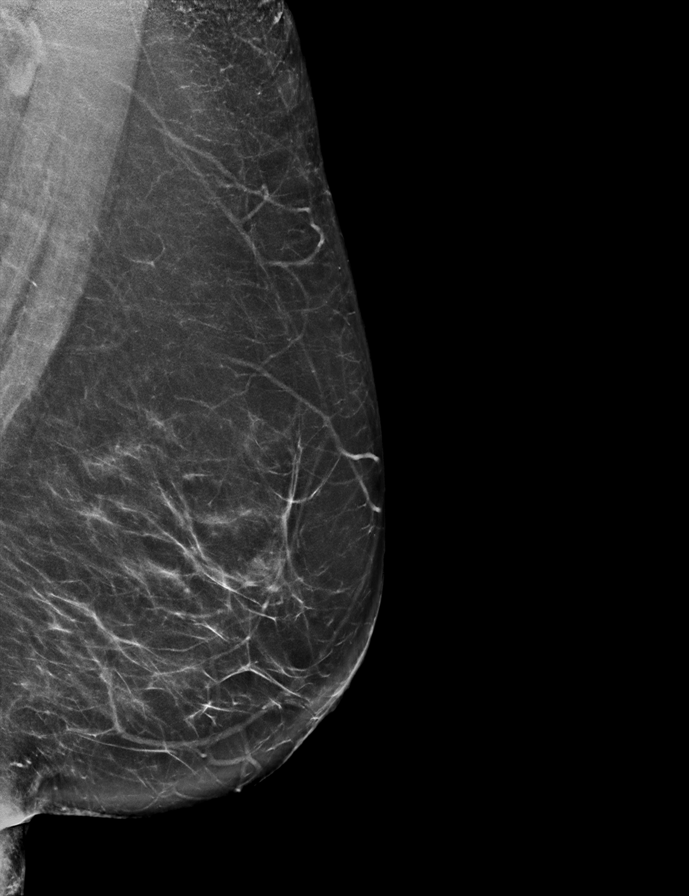

[R MLO synth-2D]
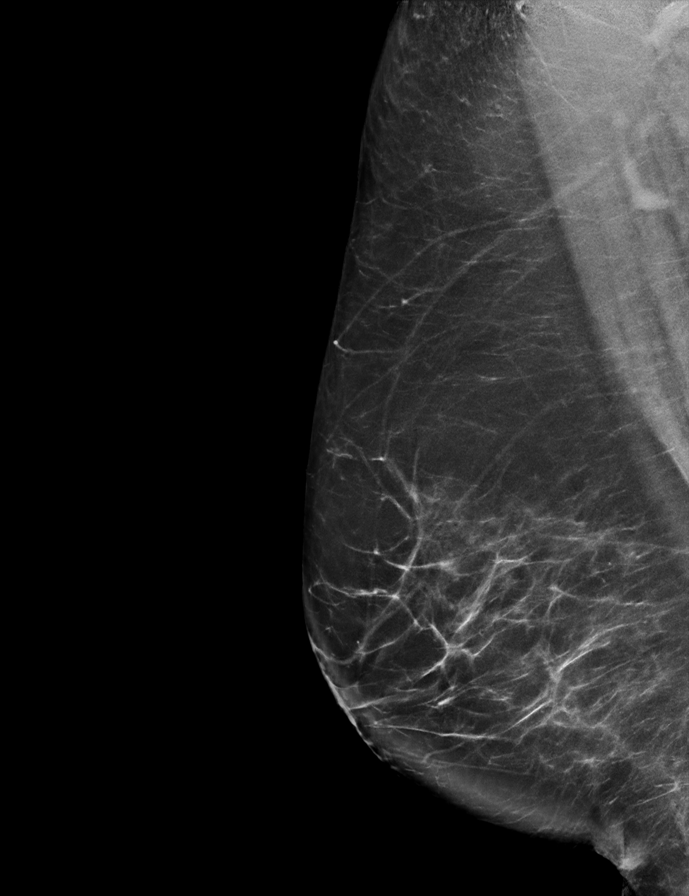

[L CC synth-2D]
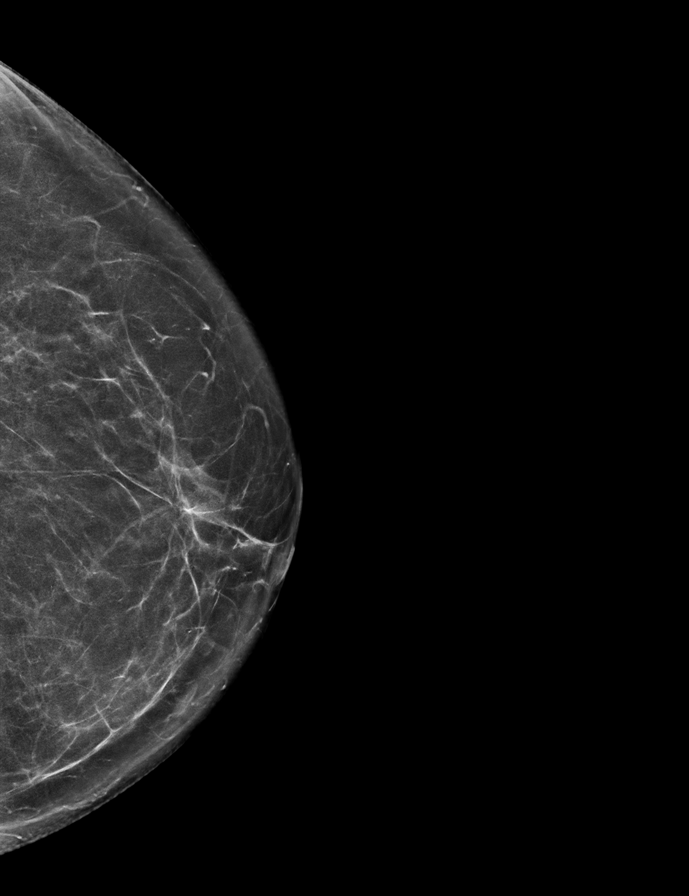

[R CC synth-2D]
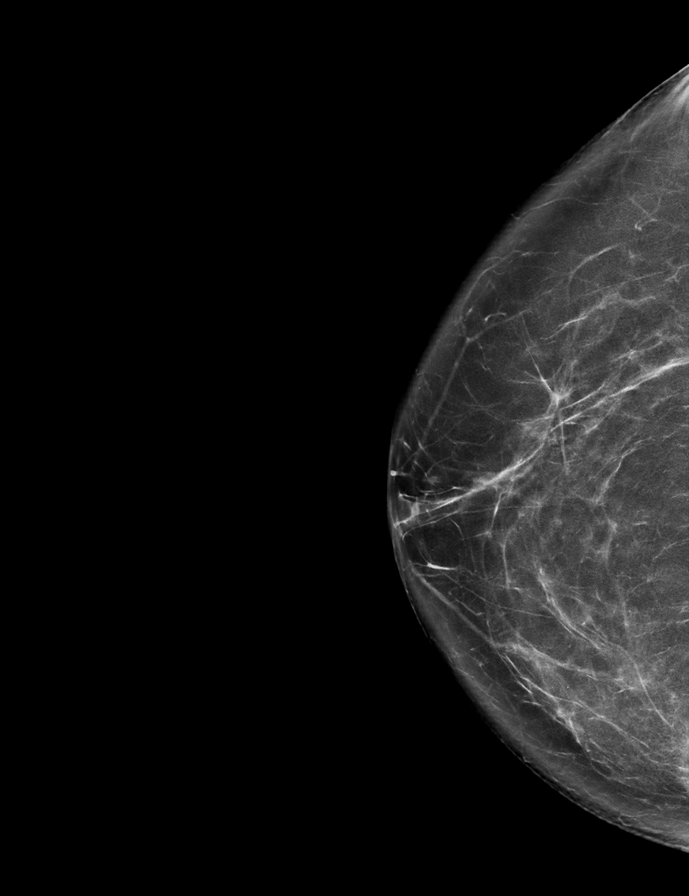

[L CC tomo · 2 of 71 frames shown]
[frame 23/71]
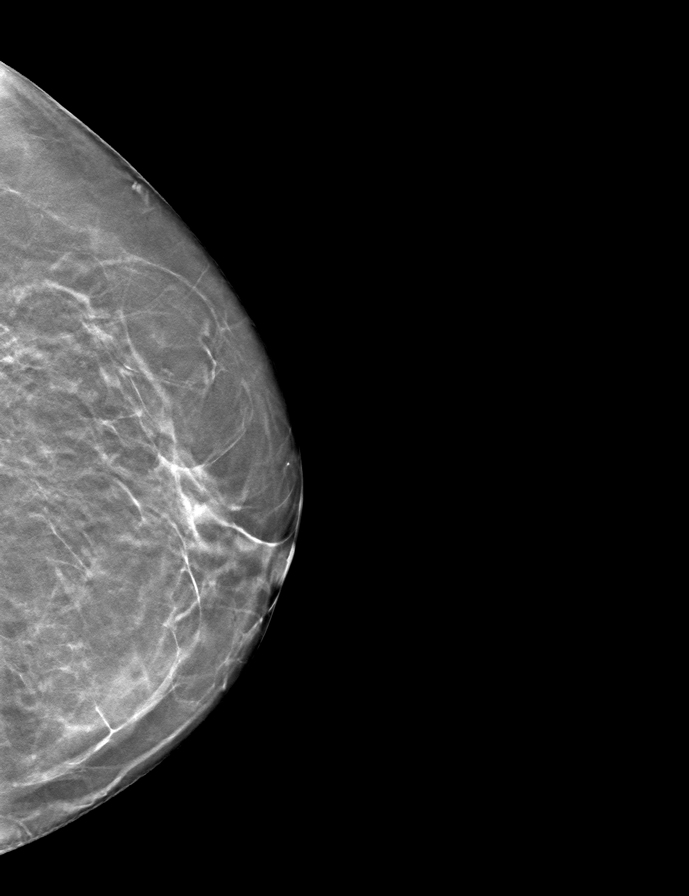
[frame 36/71]
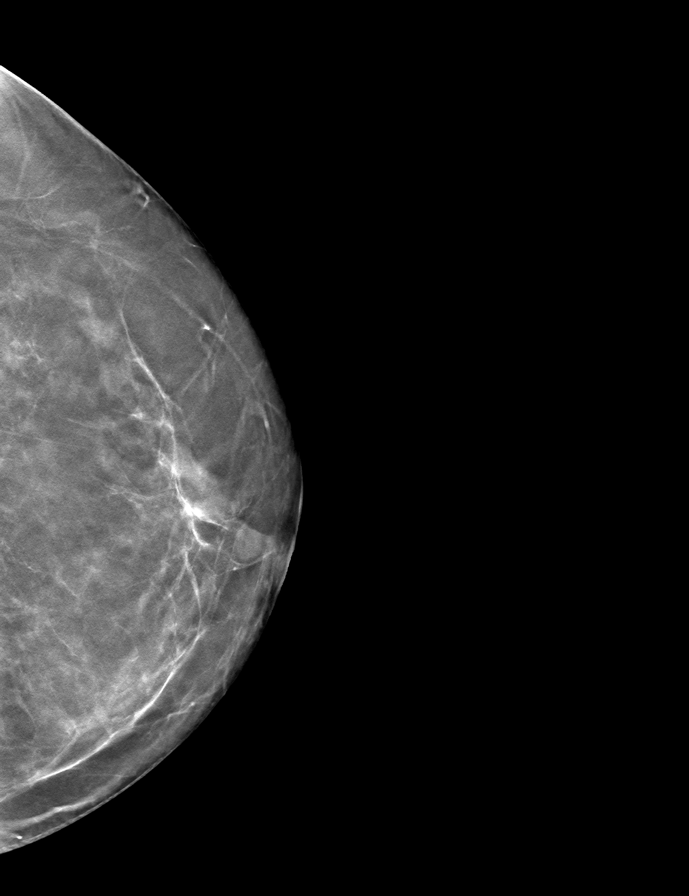

[L MLO tomo · tomo slice 37/74.0]
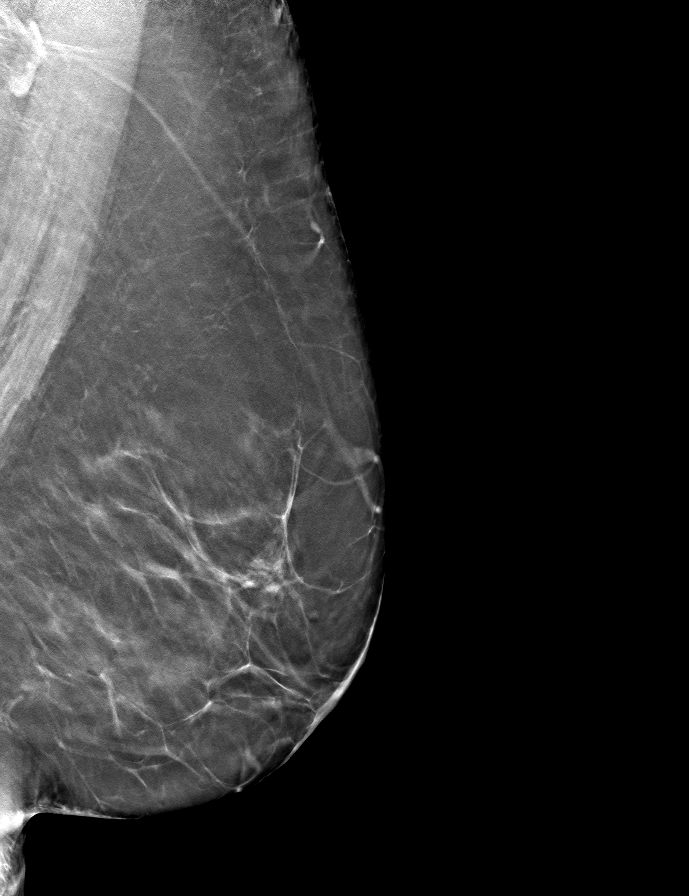

[R CC tomo · tomo slice 37/72.0]
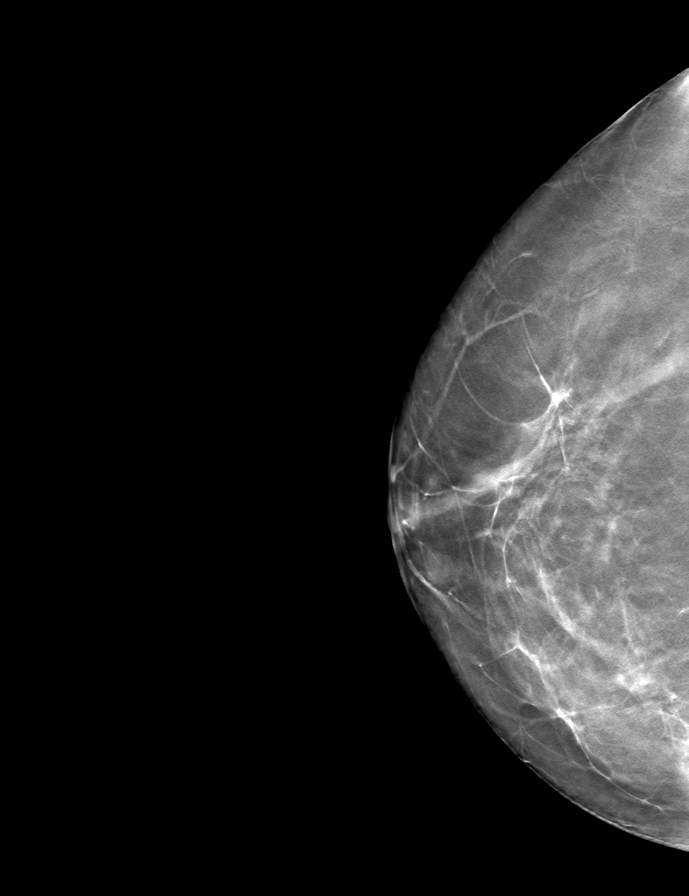

[R MLO tomo · tomo slice 41/82.0]
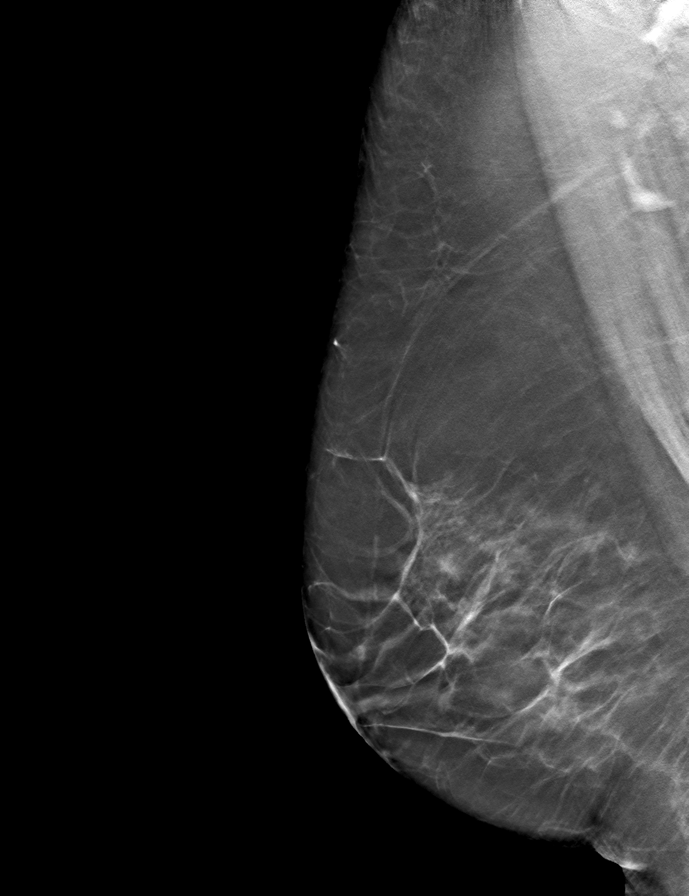

[9 of 24 positions shown; findings below may reference images not displayed]

ACR Breast Density Category b: There are scattered areas of
fibroglandular density.
FINDINGS: There are no findings suspicious for malignancy.
IMPRESSION: No mammographic evidence of malignancy. A result letter of this
screening mammogram will be mailed directly to the patient.

RECOMMENDATION:
Screening mammogram in one year. (Code:51-O-LD2)

BI-RADS CATEGORY  1: Negative.

## 2023-01-12 DIAGNOSIS — Z79899 Other long term (current) drug therapy: Secondary | ICD-10-CM | POA: Diagnosis not present

## 2023-01-12 DIAGNOSIS — I110 Hypertensive heart disease with heart failure: Secondary | ICD-10-CM | POA: Diagnosis not present

## 2023-01-12 DIAGNOSIS — I10 Essential (primary) hypertension: Secondary | ICD-10-CM | POA: Diagnosis not present

## 2023-01-13 LAB — BASIC METABOLIC PANEL WITH GFR
BUN/Creatinine Ratio: 15 (ref 12–28)
BUN: 16 mg/dL (ref 8–27)
CO2: 25 mmol/L (ref 20–29)
Calcium: 9.1 mg/dL (ref 8.7–10.3)
Chloride: 99 mmol/L (ref 96–106)
Creatinine, Ser: 1.05 mg/dL — ABNORMAL HIGH (ref 0.57–1.00)
Glucose: 78 mg/dL (ref 70–99)
Potassium: 4.5 mmol/L (ref 3.5–5.2)
Sodium: 135 mmol/L (ref 134–144)
eGFR: 60 mL/min/1.73

## 2023-01-25 DIAGNOSIS — L97929 Non-pressure chronic ulcer of unspecified part of left lower leg with unspecified severity: Secondary | ICD-10-CM | POA: Diagnosis not present

## 2023-01-25 DIAGNOSIS — U071 COVID-19: Secondary | ICD-10-CM | POA: Diagnosis not present

## 2023-01-25 DIAGNOSIS — L03116 Cellulitis of left lower limb: Secondary | ICD-10-CM | POA: Diagnosis not present

## 2023-01-25 DIAGNOSIS — J449 Chronic obstructive pulmonary disease, unspecified: Secondary | ICD-10-CM | POA: Diagnosis not present

## 2023-01-26 DIAGNOSIS — J449 Chronic obstructive pulmonary disease, unspecified: Secondary | ICD-10-CM | POA: Diagnosis not present

## 2023-01-26 DIAGNOSIS — J441 Chronic obstructive pulmonary disease with (acute) exacerbation: Secondary | ICD-10-CM | POA: Diagnosis not present

## 2023-01-26 DIAGNOSIS — E860 Dehydration: Secondary | ICD-10-CM | POA: Diagnosis not present

## 2023-01-26 DIAGNOSIS — E871 Hypo-osmolality and hyponatremia: Secondary | ICD-10-CM | POA: Diagnosis not present

## 2023-01-26 DIAGNOSIS — I10 Essential (primary) hypertension: Secondary | ICD-10-CM | POA: Diagnosis not present

## 2023-01-26 DIAGNOSIS — U071 COVID-19: Secondary | ICD-10-CM | POA: Diagnosis not present

## 2023-01-26 DIAGNOSIS — L97929 Non-pressure chronic ulcer of unspecified part of left lower leg with unspecified severity: Secondary | ICD-10-CM | POA: Diagnosis not present

## 2023-01-26 DIAGNOSIS — N179 Acute kidney failure, unspecified: Secondary | ICD-10-CM | POA: Diagnosis not present

## 2023-01-26 DIAGNOSIS — J9611 Chronic respiratory failure with hypoxia: Secondary | ICD-10-CM | POA: Diagnosis not present

## 2023-01-26 DIAGNOSIS — L03116 Cellulitis of left lower limb: Secondary | ICD-10-CM | POA: Diagnosis not present

## 2023-02-01 DIAGNOSIS — S81802A Unspecified open wound, left lower leg, initial encounter: Secondary | ICD-10-CM | POA: Diagnosis not present

## 2023-02-01 DIAGNOSIS — J439 Emphysema, unspecified: Secondary | ICD-10-CM | POA: Diagnosis not present

## 2023-02-01 DIAGNOSIS — J441 Chronic obstructive pulmonary disease with (acute) exacerbation: Secondary | ICD-10-CM | POA: Diagnosis not present

## 2023-02-01 DIAGNOSIS — L03116 Cellulitis of left lower limb: Secondary | ICD-10-CM | POA: Diagnosis not present

## 2023-02-01 DIAGNOSIS — L97929 Non-pressure chronic ulcer of unspecified part of left lower leg with unspecified severity: Secondary | ICD-10-CM | POA: Diagnosis not present

## 2023-02-01 DIAGNOSIS — L89320 Pressure ulcer of left buttock, unstageable: Secondary | ICD-10-CM | POA: Diagnosis not present

## 2023-02-01 DIAGNOSIS — N179 Acute kidney failure, unspecified: Secondary | ICD-10-CM | POA: Diagnosis not present

## 2023-02-01 DIAGNOSIS — U071 COVID-19: Secondary | ICD-10-CM | POA: Diagnosis not present

## 2023-02-01 DIAGNOSIS — J9611 Chronic respiratory failure with hypoxia: Secondary | ICD-10-CM | POA: Diagnosis not present

## 2023-02-01 DIAGNOSIS — L89310 Pressure ulcer of right buttock, unstageable: Secondary | ICD-10-CM | POA: Diagnosis not present

## 2023-02-02 DIAGNOSIS — J449 Chronic obstructive pulmonary disease, unspecified: Secondary | ICD-10-CM | POA: Diagnosis not present

## 2023-02-02 DIAGNOSIS — J9610 Chronic respiratory failure, unspecified whether with hypoxia or hypercapnia: Secondary | ICD-10-CM | POA: Diagnosis not present

## 2023-02-02 DIAGNOSIS — Z6831 Body mass index (BMI) 31.0-31.9, adult: Secondary | ICD-10-CM | POA: Diagnosis not present

## 2023-02-02 DIAGNOSIS — Z7689 Persons encountering health services in other specified circumstances: Secondary | ICD-10-CM | POA: Diagnosis not present

## 2023-02-02 DIAGNOSIS — I83029 Varicose veins of left lower extremity with ulcer of unspecified site: Secondary | ICD-10-CM | POA: Diagnosis not present

## 2023-02-03 DIAGNOSIS — K567 Ileus, unspecified: Secondary | ICD-10-CM | POA: Diagnosis not present

## 2023-02-03 DIAGNOSIS — R32 Unspecified urinary incontinence: Secondary | ICD-10-CM | POA: Diagnosis not present

## 2023-02-03 DIAGNOSIS — F319 Bipolar disorder, unspecified: Secondary | ICD-10-CM | POA: Diagnosis not present

## 2023-02-04 DIAGNOSIS — S81802A Unspecified open wound, left lower leg, initial encounter: Secondary | ICD-10-CM | POA: Diagnosis not present

## 2023-02-04 DIAGNOSIS — L89320 Pressure ulcer of left buttock, unstageable: Secondary | ICD-10-CM | POA: Diagnosis not present

## 2023-02-04 DIAGNOSIS — N179 Acute kidney failure, unspecified: Secondary | ICD-10-CM | POA: Diagnosis not present

## 2023-02-04 DIAGNOSIS — J9611 Chronic respiratory failure with hypoxia: Secondary | ICD-10-CM | POA: Diagnosis not present

## 2023-02-04 DIAGNOSIS — U071 COVID-19: Secondary | ICD-10-CM | POA: Diagnosis not present

## 2023-02-04 DIAGNOSIS — J439 Emphysema, unspecified: Secondary | ICD-10-CM | POA: Diagnosis not present

## 2023-02-04 DIAGNOSIS — L89310 Pressure ulcer of right buttock, unstageable: Secondary | ICD-10-CM | POA: Diagnosis not present

## 2023-02-04 DIAGNOSIS — J441 Chronic obstructive pulmonary disease with (acute) exacerbation: Secondary | ICD-10-CM | POA: Diagnosis not present

## 2023-02-04 DIAGNOSIS — L03116 Cellulitis of left lower limb: Secondary | ICD-10-CM | POA: Diagnosis not present

## 2023-02-05 DIAGNOSIS — L03116 Cellulitis of left lower limb: Secondary | ICD-10-CM | POA: Diagnosis not present

## 2023-02-05 DIAGNOSIS — S81802A Unspecified open wound, left lower leg, initial encounter: Secondary | ICD-10-CM | POA: Diagnosis not present

## 2023-02-05 DIAGNOSIS — J441 Chronic obstructive pulmonary disease with (acute) exacerbation: Secondary | ICD-10-CM | POA: Diagnosis not present

## 2023-02-05 DIAGNOSIS — J439 Emphysema, unspecified: Secondary | ICD-10-CM | POA: Diagnosis not present

## 2023-02-05 DIAGNOSIS — J9611 Chronic respiratory failure with hypoxia: Secondary | ICD-10-CM | POA: Diagnosis not present

## 2023-02-05 DIAGNOSIS — L89310 Pressure ulcer of right buttock, unstageable: Secondary | ICD-10-CM | POA: Diagnosis not present

## 2023-02-05 DIAGNOSIS — U071 COVID-19: Secondary | ICD-10-CM | POA: Diagnosis not present

## 2023-02-05 DIAGNOSIS — N179 Acute kidney failure, unspecified: Secondary | ICD-10-CM | POA: Diagnosis not present

## 2023-02-05 DIAGNOSIS — L89320 Pressure ulcer of left buttock, unstageable: Secondary | ICD-10-CM | POA: Diagnosis not present

## 2023-02-07 NOTE — Progress Notes (Deleted)
 Cardiology Office Note:  .   Date:  02/07/2023  ID:  Tracy Oconnor, DOB [birth year], MRN [id number] PCP: Tracy Hun, MD  [facility] Providers Cardiologist:  Tracy Meriweather Tracy Crape, MD    History of Present Illness: .   Tracy Oconnor is a 63 y.o. female with a past medical history of hypertension, aortic atherosclerosis noted on CT imaging, COPD oxygen  dependent, OSA, palpitations, bipolar disorder, hyperlipidemia, former smoker cessation 2022.  08/26/2022 echo EF 55 to 60%, no RWMA, mild concentric LVH, grade 1 DD, moderate MR, mild AR 08/26/2022 abdominal ultrasound 2.2 cm dilation of the proximal abdominal aorta 08/13/2022 left heart cath severe single-vessel CAD with CTO of distal RCA with left-right collaterals, moderate LCA disease  She was evaluated following her abnormal stress evaluation.  The decision was made to proceed with a left heart catheterization.  She underwent this on 08/13/2022 which revealed severe single-vessel CAD with CTO of the distal RCA with left-to-right collaterals, moderate LCA disease with 60% mid LAD stenosis.  She was started on isosorbide .  Evaluated by Dr. Crape on 12/01/2022, she was stable from a cardiac perspective and advised to follow-up in 9 months.  She was evaluated at [facility] emergency department on 12/22/2022 after presenting via EMS for shortness of breath and worsening edema.  She was given diuretics and clinically improved.  CT of the chest demonstrated no evidence of PE or pneumonia.  Her Lasix  was increased to twice daily, she was given antibiotics and prednisone.  Sodium was 130, Cr 0.80.  She presents today for follow-up after ED visit as outlined above.  She is feeling much better, still bothered by pedal edema but her shortness of breath is returned to baseline.  She is not able to weigh herself daily, does not have a scale. She denies chest pain, palpitations, pnd, orthopnea, n, v, dizziness, syncope, weight gain, or early  satiety.   ROS: Review of Systems  Constitutional:  Positive for malaise/fatigue.  HENT: Negative.    Eyes: Negative.   Respiratory:  Positive for shortness of breath.   Cardiovascular: Negative.   Gastrointestinal: Negative.   Genitourinary: Negative.   Musculoskeletal: Negative.   Skin: Negative.   Neurological: Negative.   Endo/Heme/Allergies:  Bruises/bleeds easily.  Psychiatric/Behavioral: Negative.      Studies Reviewed: .        Cardiac Studies & Procedures   CARDIAC CATHETERIZATION  CARDIAC CATHETERIZATION 08/13/2022  Narrative Conclusions: Severe single-vessel coronary artery disease with chronic total occlusion of distal RCA with robust left-to-right collaterals. Moderate left coronary artery disease with 60% mid LAD stenosis (RFR borderline at 0.90) and 50% D2 and OM2 stenoses. Normal left ventricular systolic function (LVEF 55-65%) with mildly elevated filling pressure (LVEDP 18 mmHg).  Recommendations: Add isosorbide  mononitrate for antianginal therapy. Aggressive secondary prevention of coronary artery disease.  Lonni Hanson, MD [facility]  Findings Coronary Findings Diagnostic  Dominance: Right  Left Main Vessel is large. Vessel is angiographically normal.  Left Anterior Descending Mid LAD lesion is 60% stenosed. Pressure gradient was performed on the lesion using a GUIDEWIRE PRESSURE X 175 and CATH LAUNCHER 24F EBU3.5. RFR: 0.9.  First Diagonal Branch Vessel is moderate in size.  Second Diagonal Branch Vessel is moderate in size. 2nd Diag lesion is 50% stenosed.  Left Circumflex Vessel is moderate in size.  First Obtuse Marginal Branch Vessel is moderate in size.  Second Obtuse Marginal Branch Vessel is moderate in size. 2nd Mrg lesion is 50% stenosed.  Third Obtuse  Marginal Branch Vessel is small in size.  Right Coronary Artery Vessel is moderate in size. Mid RCA lesion is 60% stenosed. Dist RCA-1 lesion is 100% stenosed. The  lesion is chronically occluded with bridging collateral flow. Dist RCA-2 lesion is 100% stenosed. The lesion is chronically occluded with left-to-right collateral flow.  Right Posterior Descending Artery Collaterals RPDA filled by collaterals from Dist LAD.  First Right Posterolateral Branch Collaterals 1st RPL filled by collaterals from 2nd Sept.  Intervention  No interventions have been documented.   STRESS TESTS  MYOCARDIAL PERFUSION IMAGING 07/30/2022  Narrative   Findings are consistent with ischemia involving basal, mid and apical portion of the inferior wall. The study is intermediate risk secondary to mildly diminished EF.   No ST deviation was noted.   Left ventricular function is abnormal. Global function is mildly reduced. Nuclear stress EF: 49 %. The left ventricular ejection fraction is mildly decreased (45-54%). End diastolic cavity size is normal.   Prior study not available for comparison.  ECHOCARDIOGRAM  ECHOCARDIOGRAM COMPLETE 08/26/2022  Narrative ECHOCARDIOGRAM REPORT    Patient Name:   Tracy Oconnor Date of Exam: 08/26/2022 Medical Rec #:  [id number]        Height:       60.0 in Accession #:    [id number]       Weight:       166.0 lb Date of Birth:  06-25-1960        BSA:          1.724 m Patient Age:    62 years         BP:           140/5988 mmHg Patient Gender: F                HR:           62 bpm. Exam Location:  [facility]  Procedure: 2D Echo, Cardiac Doppler, Color Doppler and Strain Analysis  Indications:    DOE (dyspnea on exertion) [R06.09 (ICD-10-CM)]; Coronary artery disease involving native coronary artery of native heart without angina pectoris [I25.10 (ICD-10-CM)]  History:        Patient has no prior history of Echocardiogram examinations. Risk Factors:Hypertension and Dyslipidemia.  Sonographer:    Lynwood Silvas RDCS Referring Phys: CYRUS Pearson Reasons SAUNDERS [facility]  IMPRESSIONS   1. Left ventricular ejection fraction, by estimation, is 55  to 60%. The left ventricle has normal function. The left ventricle has no regional wall motion abnormalities. There is mild concentric left ventricular hypertrophy. Left ventricular diastolic parameters are consistent with Grade I diastolic dysfunction (impaired relaxation). The average left ventricular global longitudinal strain is -13.8 %. The global longitudinal strain is abnormal. 2. Right ventricular systolic function is mildly reduced. The right ventricular size is normal. Mildly increased right ventricular wall thickness. Tricuspid regurgitation signal is inadequate for assessing PA pressure. 3. The mitral valve is degenerative. Moderate mitral valve regurgitation. No evidence of mitral stenosis. 4. The aortic valve is tricuspid. Aortic valve regurgitation is mild. No aortic stenosis is present. 5. Aortic Normal DTA.  FINDINGS Left Ventricle: Left ventricular ejection fraction, by estimation, is 55 to 60%. The left ventricle has normal function. The left ventricle has no regional wall motion abnormalities. The average left ventricular global longitudinal strain is -13.8 %. The global longitudinal strain is abnormal. The left ventricular internal cavity size was normal in size. There is mild concentric left ventricular hypertrophy. Left ventricular diastolic parameters are consistent with Grade I  diastolic dysfunction (impaired relaxation). Indeterminate filling pressures.  Right Ventricle: The right ventricular size is normal. Mildly increased right ventricular wall thickness. Right ventricular systolic function is mildly reduced. Tricuspid regurgitation signal is inadequate for assessing PA pressure.  Left Atrium: Left atrial size was normal in size.  Right Atrium: Right atrial size was normal in size.  Pericardium: There is no evidence of pericardial effusion. Presence of epicardial fat layer.  Mitral Valve: The mitral valve is degenerative in appearance. Mild mitral annular  calcification. Moderate mitral valve regurgitation. No evidence of mitral valve stenosis.  Tricuspid Valve: The tricuspid valve is normal in structure. Tricuspid valve regurgitation is trivial. No evidence of tricuspid stenosis.  Aortic Valve: The aortic valve is tricuspid. Aortic valve regurgitation is mild. No aortic stenosis is present.  Pulmonic Valve: The pulmonic valve was normal in structure. Pulmonic valve regurgitation is trivial. No evidence of pulmonic stenosis.  Aorta: The aortic root and ascending aorta are structurally normal, with no evidence of dilitation, the aortic arch was not well visualized and Normal DTA.  Venous: The pulmonary veins were not well visualized. The inferior vena cava was not well visualized.  IAS/Shunts: No atrial level shunt detected by color flow Doppler.   LEFT VENTRICLE PLAX 2D LVIDd:         4.40 cm   Diastology LVIDs:         3.10 cm   LV e' medial:    4.03 cm/s LV PW:         1.20 cm   LV E/e' medial:  16.1 LV IVS:        1.10 cm   LV e' lateral:   6.53 cm/s LVOT diam:     2.00 cm   LV E/e' lateral: 9.9 LV SV:         65 LV SV Index:   38        2D Longitudinal Strain LVOT Area:     3.14 cm  2D Strain GLS Avg:     -13.8 %   RIGHT VENTRICLE            IVC RV Basal diam:  2.70 cm    IVC diam: 1.30 cm RV S prime:     9.68 cm/s TAPSE (M-mode): 1.6 cm  LEFT ATRIUM             Index        RIGHT ATRIUM          Index LA diam:        3.60 cm 2.09 cm/m   RA Area:     9.79 cm LA Vol (A2C):   25.5 ml 14.79 ml/m  RA Volume:   19.40 ml 11.25 ml/m LA Vol (A4C):   23.1 ml 13.40 ml/m LA Biplane Vol: 24.3 ml 14.09 ml/m AORTIC VALVE LVOT Vmax:   103.25 cm/s LVOT Vmean:  63.150 cm/s LVOT VTI:    0.206 m  AORTA Ao Root diam: 2.80 cm Ao Asc diam:  3.50 cm Ao Desc diam: 2.40 cm  MR Peak grad:    81.0 mmHg    TRICUSPID VALVE MR Mean grad:    49.0 mmHg    TR Peak grad:   8.1 mmHg MR Vmax:         450.00 cm/s  TR Vmax:        142.00 cm/s MR  Vmean:        322.0 cm/s MR PISA:         4.54  cm     SHUNTS MR PISA Eff ROA: 39 mm       Systemic VTI:  0.21 m MR PISA Radius:  0.85 cm      Systemic Diam: 2.00 cm MV E velocity: 64.75 cm/s MV A velocity: 83.85 cm/s MV E/A ratio:  0.77  Redell Leiter MD Electronically signed by Redell Leiter MD Signature Date/Time: 08/26/2022/12:37:05 PM    Final             Risk Assessment/Calculations:     No BP recorded.  {Refresh Note OR Click here to enter BP  :1}***       Physical Exam:   VS:  There were no vitals taken for this visit.   Wt Readings from Last 3 Encounters:  12/28/22 166 lb 9.6 oz (75.6 kg)  12/01/22 157 lb (71.2 kg)  08/27/22 163 lb (73.9 kg)    GEN: chronically ill-appearing, no acute distress NECK: No JVD; No carotid bruits CARDIAC: RRR, no murmurs, rubs, gallops RESPIRATORY:  diminished in bases, expiratory wheezing ABDOMEN: Soft, non-tender, non-distended EXTREMITIES:  +2 pedal edema; No deformity; right wrist cath site healing well, bruising noted  ASSESSMENT AND PLAN: .   Coronary artery disease -left heart cath revealed severe single-vessel CAD with CTO of distal RCA with left-right collaterals, moderate LCA disease. Stable with no anginal symptoms. No indication for ischemic evaluation.  Continue aspirin  81 mg daily, continue Lipitor 20 mg daily, continue isosorbide  30 mg daily, continue nitroglycerin  as needed.  Hypertensive heart disease with heart failure - NYHA class II, still volume overloaded but better, will increase her torsemide  to 20 mg in am and 10 mg in afternoon. Will plan to add SGLT2 at next OV.   SOB/DOE-multifactorial most likely related to her COPD and CAD, she is at her baseline with her breathing today. Reviewed how and when to take. Most recent Creatinine 0.80 two weeks ago.   Hyperlipidemia/elevated LPa-most recent LDL was well-controlled at 62 on 08/12/2022, continue Lipitor 20 mg daily.  Hypertension-blood pressure is controlled at  132/80. Continue losartan  25 mg daily.   COPD -former smoker complete cessation x 2 years, followed by Dr. Guy.  She is on oxygen  3 L continuous.  Moderate mitral regurgitation-noted on most recent echo, she is short of breath however she has advanced COPD as well.     Dispo: BMET, CBC today, increase torsemide  to 20 mg in am and 10 mg in pm, repeat BMET in 1 week. Return in 6 weeks.   Signed, Delon JAYSON Hoover, NP

## 2023-02-08 ENCOUNTER — Ambulatory Visit: Payer: Medicare HMO | Admitting: Cardiology

## 2023-02-09 DIAGNOSIS — N179 Acute kidney failure, unspecified: Secondary | ICD-10-CM | POA: Diagnosis not present

## 2023-02-09 DIAGNOSIS — J441 Chronic obstructive pulmonary disease with (acute) exacerbation: Secondary | ICD-10-CM | POA: Diagnosis not present

## 2023-02-09 DIAGNOSIS — S81802A Unspecified open wound, left lower leg, initial encounter: Secondary | ICD-10-CM | POA: Diagnosis not present

## 2023-02-09 DIAGNOSIS — L89320 Pressure ulcer of left buttock, unstageable: Secondary | ICD-10-CM | POA: Diagnosis not present

## 2023-02-09 DIAGNOSIS — J439 Emphysema, unspecified: Secondary | ICD-10-CM | POA: Diagnosis not present

## 2023-02-09 DIAGNOSIS — J9611 Chronic respiratory failure with hypoxia: Secondary | ICD-10-CM | POA: Diagnosis not present

## 2023-02-09 DIAGNOSIS — L89310 Pressure ulcer of right buttock, unstageable: Secondary | ICD-10-CM | POA: Diagnosis not present

## 2023-02-09 DIAGNOSIS — U071 COVID-19: Secondary | ICD-10-CM | POA: Diagnosis not present

## 2023-02-09 DIAGNOSIS — L03116 Cellulitis of left lower limb: Secondary | ICD-10-CM | POA: Diagnosis not present

## 2023-02-10 DIAGNOSIS — S81802A Unspecified open wound, left lower leg, initial encounter: Secondary | ICD-10-CM | POA: Diagnosis not present

## 2023-02-10 DIAGNOSIS — J9611 Chronic respiratory failure with hypoxia: Secondary | ICD-10-CM | POA: Diagnosis not present

## 2023-02-10 DIAGNOSIS — L89310 Pressure ulcer of right buttock, unstageable: Secondary | ICD-10-CM | POA: Diagnosis not present

## 2023-02-10 DIAGNOSIS — U071 COVID-19: Secondary | ICD-10-CM | POA: Diagnosis not present

## 2023-02-10 DIAGNOSIS — J441 Chronic obstructive pulmonary disease with (acute) exacerbation: Secondary | ICD-10-CM | POA: Diagnosis not present

## 2023-02-10 DIAGNOSIS — L03116 Cellulitis of left lower limb: Secondary | ICD-10-CM | POA: Diagnosis not present

## 2023-02-10 DIAGNOSIS — L89320 Pressure ulcer of left buttock, unstageable: Secondary | ICD-10-CM | POA: Diagnosis not present

## 2023-02-10 DIAGNOSIS — N179 Acute kidney failure, unspecified: Secondary | ICD-10-CM | POA: Diagnosis not present

## 2023-02-10 DIAGNOSIS — J439 Emphysema, unspecified: Secondary | ICD-10-CM | POA: Diagnosis not present

## 2023-02-11 ENCOUNTER — Encounter: Payer: Self-pay | Admitting: Cardiology

## 2023-02-11 DIAGNOSIS — L89320 Pressure ulcer of left buttock, unstageable: Secondary | ICD-10-CM | POA: Diagnosis not present

## 2023-02-11 DIAGNOSIS — J9611 Chronic respiratory failure with hypoxia: Secondary | ICD-10-CM | POA: Diagnosis not present

## 2023-02-11 DIAGNOSIS — I340 Nonrheumatic mitral (valve) insufficiency: Secondary | ICD-10-CM | POA: Insufficient documentation

## 2023-02-11 DIAGNOSIS — L03116 Cellulitis of left lower limb: Secondary | ICD-10-CM | POA: Diagnosis not present

## 2023-02-11 DIAGNOSIS — E7841 Elevated Lipoprotein(a): Secondary | ICD-10-CM | POA: Insufficient documentation

## 2023-02-11 DIAGNOSIS — N179 Acute kidney failure, unspecified: Secondary | ICD-10-CM | POA: Diagnosis not present

## 2023-02-11 DIAGNOSIS — L89310 Pressure ulcer of right buttock, unstageable: Secondary | ICD-10-CM | POA: Diagnosis not present

## 2023-02-11 DIAGNOSIS — J441 Chronic obstructive pulmonary disease with (acute) exacerbation: Secondary | ICD-10-CM | POA: Diagnosis not present

## 2023-02-11 DIAGNOSIS — S81802A Unspecified open wound, left lower leg, initial encounter: Secondary | ICD-10-CM | POA: Diagnosis not present

## 2023-02-11 DIAGNOSIS — U071 COVID-19: Secondary | ICD-10-CM | POA: Diagnosis not present

## 2023-02-11 DIAGNOSIS — J439 Emphysema, unspecified: Secondary | ICD-10-CM | POA: Diagnosis not present

## 2023-02-11 NOTE — Progress Notes (Deleted)
 Cardiology Office Note:  .   Date:  02/11/2023  ID:  Tracy Oconnor, DOB 1960/04/13, MRN [id number] PCP: Trinidad Hun, MD  [facility] Providers Cardiologist:  Shandora Koogler [name], MD    History of Present Illness: .   Tracy Oconnor is a 63 y.o. female with a past medical history of hypertension, aortic atherosclerosis noted on CT imaging, COPD oxygen  dependent, OSA, palpitations, bipolar disorder, hyperlipidemia, former smoker cessation 2022.  08/26/2022 echo EF 55 to 60%, no RWMA, mild concentric LVH, grade 1 DD, moderate MR, mild AR 08/26/2022 abdominal ultrasound 2.2 cm dilation of the proximal abdominal aorta 08/13/2022 left heart cath severe single-vessel CAD with CTO of distal RCA with left-right collaterals, moderate LCA disease  She was evaluated following her abnormal stress evaluation.  The decision was made to proceed with a left heart catheterization.  She underwent this on 08/13/2022 which revealed severe single-vessel CAD with CTO of the distal RCA with left-to-right collaterals, moderate LCA disease with 60% mid LAD stenosis.  She was started on isosorbide .  Evaluated by Dr. Crape on 12/01/2022, she was stable from a cardiac perspective and advised to follow-up in 9 months.  She was evaluated at [facility] emergency department on 12/22/2022 after presenting via EMS for shortness of breath and worsening edema.  She was given diuretics and clinically improved.  CT of the chest demonstrated no evidence of PE or pneumonia.  Her Lasix  was increased to twice daily, she was given antibiotics and prednisone.  Sodium was 130, Cr 0.80.  She presents today for follow-up after ED visit as outlined above.  She is feeling much better, still bothered by pedal edema but her shortness of breath is returned to baseline.  She is not able to weigh herself daily, does not have a scale. She denies chest pain, palpitations, pnd, orthopnea, n, v, dizziness, syncope, weight gain, or early  satiety.   ROS: Review of Systems  Constitutional:  Positive for malaise/fatigue.  HENT: Negative.    Eyes: Negative.   Respiratory:  Positive for shortness of breath.   Cardiovascular: Negative.   Gastrointestinal: Negative.   Genitourinary: Negative.   Musculoskeletal: Negative.   Skin: Negative.   Neurological: Negative.   Endo/Heme/Allergies:  Bruises/bleeds easily.  Psychiatric/Behavioral: Negative.      Studies Reviewed: .        Cardiac Studies & Procedures   CARDIAC CATHETERIZATION  CARDIAC CATHETERIZATION 08/13/2022  Narrative Conclusions: Severe single-vessel coronary artery disease with chronic total occlusion of distal RCA with robust left-to-right collaterals. Moderate left coronary artery disease with 60% mid LAD stenosis (RFR borderline at 0.90) and 50% D2 and OM2 stenoses. Normal left ventricular systolic function (LVEF 55-65%) with mildly elevated filling pressure (LVEDP 18 mmHg).  Recommendations: Add isosorbide  mononitrate for antianginal therapy. Aggressive secondary prevention of coronary artery disease.  Lonni Hanson, MD [facility]  Findings Coronary Findings Diagnostic  Dominance: Right  Left Main Vessel is large. Vessel is angiographically normal.  Left Anterior Descending Mid LAD lesion is 60% stenosed. Pressure gradient was performed on the lesion using a GUIDEWIRE PRESSURE X 175 and CATH LAUNCHER 57F EBU3.5. RFR: 0.9.  First Diagonal Branch Vessel is moderate in size.  Second Diagonal Branch Vessel is moderate in size. 2nd Diag lesion is 50% stenosed.  Left Circumflex Vessel is moderate in size.  First Obtuse Marginal Branch Vessel is moderate in size.  Second Obtuse Marginal Branch Vessel is moderate in size. 2nd Mrg lesion is 50% stenosed.  Third Obtuse  Marginal Branch Vessel is small in size.  Right Coronary Artery Vessel is moderate in size. Mid RCA lesion is 60% stenosed. Dist RCA-1 lesion is 100% stenosed. The  lesion is chronically occluded with bridging collateral flow. Dist RCA-2 lesion is 100% stenosed. The lesion is chronically occluded with left-to-right collateral flow.  Right Posterior Descending Artery Collaterals RPDA filled by collaterals from Dist LAD.  First Right Posterolateral Branch Collaterals 1st RPL filled by collaterals from 2nd Sept.  Intervention  No interventions have been documented.   STRESS TESTS  MYOCARDIAL PERFUSION IMAGING 07/30/2022  Narrative   Findings are consistent with ischemia involving basal, mid and apical portion of the inferior wall. The study is intermediate risk secondary to mildly diminished EF.   No ST deviation was noted.   Left ventricular function is abnormal. Global function is mildly reduced. Nuclear stress EF: 49 %. The left ventricular ejection fraction is mildly decreased (45-54%). End diastolic cavity size is normal.   Prior study not available for comparison.  ECHOCARDIOGRAM  ECHOCARDIOGRAM COMPLETE 08/26/2022  Narrative ECHOCARDIOGRAM REPORT    Patient Name:   Tracy Oconnor Date of Exam: 08/26/2022 Medical Rec #:  [id number]        Height:       60.0 in Accession #:    [id number]       Weight:       166.0 lb Date of Birth:  1961-01-24        BSA:          1.724 m Patient Age:    62 years         BP:           140/5988 mmHg Patient Gender: F                HR:           62 bpm. Exam Location:  [place]  Procedure: 2D Echo, Cardiac Doppler, Color Doppler and Strain Analysis  Indications:    DOE (dyspnea on exertion) [R06.09 (ICD-10-CM)]; Coronary artery disease involving native coronary artery of native heart without angina pectoris [I25.10 (ICD-10-CM)]  History:        Patient has no prior history of Echocardiogram examinations. Risk Factors:Hypertension and Dyslipidemia.  Sonographer:    Lynwood Silvas RDCS Referring Phys: CYRUS Cristina Mattern SAUNDERS [facility]  IMPRESSIONS   1. Left ventricular ejection fraction, by estimation, is 55  to 60%. The left ventricle has normal function. The left ventricle has no regional wall motion abnormalities. There is mild concentric left ventricular hypertrophy. Left ventricular diastolic parameters are consistent with Grade I diastolic dysfunction (impaired relaxation). The average left ventricular global longitudinal strain is -13.8 %. The global longitudinal strain is abnormal. 2. Right ventricular systolic function is mildly reduced. The right ventricular size is normal. Mildly increased right ventricular wall thickness. Tricuspid regurgitation signal is inadequate for assessing PA pressure. 3. The mitral valve is degenerative. Moderate mitral valve regurgitation. No evidence of mitral stenosis. 4. The aortic valve is tricuspid. Aortic valve regurgitation is mild. No aortic stenosis is present. 5. Aortic Normal DTA.  FINDINGS Left Ventricle: Left ventricular ejection fraction, by estimation, is 55 to 60%. The left ventricle has normal function. The left ventricle has no regional wall motion abnormalities. The average left ventricular global longitudinal strain is -13.8 %. The global longitudinal strain is abnormal. The left ventricular internal cavity size was normal in size. There is mild concentric left ventricular hypertrophy. Left ventricular diastolic parameters are consistent with Grade I  diastolic dysfunction (impaired relaxation). Indeterminate filling pressures.  Right Ventricle: The right ventricular size is normal. Mildly increased right ventricular wall thickness. Right ventricular systolic function is mildly reduced. Tricuspid regurgitation signal is inadequate for assessing PA pressure.  Left Atrium: Left atrial size was normal in size.  Right Atrium: Right atrial size was normal in size.  Pericardium: There is no evidence of pericardial effusion. Presence of epicardial fat layer.  Mitral Valve: The mitral valve is degenerative in appearance. Mild mitral annular  calcification. Moderate mitral valve regurgitation. No evidence of mitral valve stenosis.  Tricuspid Valve: The tricuspid valve is normal in structure. Tricuspid valve regurgitation is trivial. No evidence of tricuspid stenosis.  Aortic Valve: The aortic valve is tricuspid. Aortic valve regurgitation is mild. No aortic stenosis is present.  Pulmonic Valve: The pulmonic valve was normal in structure. Pulmonic valve regurgitation is trivial. No evidence of pulmonic stenosis.  Aorta: The aortic root and ascending aorta are structurally normal, with no evidence of dilitation, the aortic arch was not well visualized and Normal DTA.  Venous: The pulmonary veins were not well visualized. The inferior vena cava was not well visualized.  IAS/Shunts: No atrial level shunt detected by color flow Doppler.   LEFT VENTRICLE PLAX 2D LVIDd:         4.40 cm   Diastology LVIDs:         3.10 cm   LV e' medial:    4.03 cm/s LV PW:         1.20 cm   LV E/e' medial:  16.1 LV IVS:        1.10 cm   LV e' lateral:   6.53 cm/s LVOT diam:     2.00 cm   LV E/e' lateral: 9.9 LV SV:         65 LV SV Index:   38        2D Longitudinal Strain LVOT Area:     3.14 cm  2D Strain GLS Avg:     -13.8 %   RIGHT VENTRICLE            IVC RV Basal diam:  2.70 cm    IVC diam: 1.30 cm RV S prime:     9.68 cm/s TAPSE (M-mode): 1.6 cm  LEFT ATRIUM             Index        RIGHT ATRIUM          Index LA diam:        3.60 cm 2.09 cm/m   RA Area:     9.79 cm LA Vol (A2C):   25.5 ml 14.79 ml/m  RA Volume:   19.40 ml 11.25 ml/m LA Vol (A4C):   23.1 ml 13.40 ml/m LA Biplane Vol: 24.3 ml 14.09 ml/m AORTIC VALVE LVOT Vmax:   103.25 cm/s LVOT Vmean:  63.150 cm/s LVOT VTI:    0.206 m  AORTA Ao Root diam: 2.80 cm Ao Asc diam:  3.50 cm Ao Desc diam: 2.40 cm  MR Peak grad:    81.0 mmHg    TRICUSPID VALVE MR Mean grad:    49.0 mmHg    TR Peak grad:   8.1 mmHg MR Vmax:         450.00 cm/s  TR Vmax:        142.00 cm/s MR  Vmean:        322.0 cm/s MR PISA:         4.54  cm     SHUNTS MR PISA Eff ROA: 39 mm       Systemic VTI:  0.21 m MR PISA Radius:  0.85 cm      Systemic Diam: 2.00 cm MV E velocity: 64.75 cm/s MV A velocity: 83.85 cm/s MV E/A ratio:  0.77  Redell Leiter MD Electronically signed by Redell Leiter MD Signature Date/Time: 08/26/2022/12:37:05 PM    Final             Risk Assessment/Calculations:     No BP recorded.  {Refresh Note OR Click here to enter BP  :1}***       Physical Exam:   VS:  There were no vitals taken for this visit.   Wt Readings from Last 3 Encounters:  12/28/22 166 lb 9.6 oz (75.6 kg)  12/01/22 157 lb (71.2 kg)  08/27/22 163 lb (73.9 kg)    GEN: chronically ill-appearing, no acute distress NECK: No JVD; No carotid bruits CARDIAC: RRR, no murmurs, rubs, gallops RESPIRATORY:  diminished in bases, expiratory wheezing ABDOMEN: Soft, non-tender, non-distended EXTREMITIES:  +2 pedal edema; No deformity; right wrist cath site healing well, bruising noted  ASSESSMENT AND PLAN: .   Coronary artery disease -left heart cath revealed severe single-vessel CAD with CTO of distal RCA with left-right collaterals, moderate LCA disease. Stable with no anginal symptoms. No indication for ischemic evaluation.  Continue aspirin  81 mg daily, continue Lipitor 20 mg daily, continue isosorbide  30 mg daily, continue nitroglycerin  as needed.  Hypertensive heart disease with heart failure - NYHA class II, still volume overloaded but better, will increase her torsemide  to 20 mg in am and 10 mg in afternoon. Will plan to add SGLT2 at next OV.   SOB/DOE-multifactorial most likely related to her COPD and CAD, she is at her baseline with her breathing today. Reviewed how and when to take. Most recent Creatinine 0.80 two weeks ago.   Hyperlipidemia/elevated LPa-most recent LDL was well-controlled at 62 on 08/12/2022, continue Lipitor 20 mg daily.  Hypertension-blood pressure is controlled at  132/80. Continue losartan  25 mg daily.   COPD -former smoker complete cessation x 2 years, followed by Dr. Guy.  She is on oxygen  3 L continuous.  Moderate mitral regurgitation-noted on most recent echo, she is short of breath however she has advanced COPD as well.     Dispo: BMET, CBC today, increase torsemide  to 20 mg in am and 10 mg in pm, repeat BMET in 1 week. Return in 6 weeks.   Signed, Delon JAYSON Hoover, NP

## 2023-02-12 ENCOUNTER — Ambulatory Visit: Payer: Medicare HMO | Admitting: Cardiology

## 2023-02-12 DIAGNOSIS — J441 Chronic obstructive pulmonary disease with (acute) exacerbation: Secondary | ICD-10-CM | POA: Diagnosis not present

## 2023-02-12 DIAGNOSIS — J439 Emphysema, unspecified: Secondary | ICD-10-CM | POA: Diagnosis not present

## 2023-02-12 DIAGNOSIS — S81802A Unspecified open wound, left lower leg, initial encounter: Secondary | ICD-10-CM | POA: Diagnosis not present

## 2023-02-12 DIAGNOSIS — U071 COVID-19: Secondary | ICD-10-CM | POA: Diagnosis not present

## 2023-02-12 DIAGNOSIS — N179 Acute kidney failure, unspecified: Secondary | ICD-10-CM | POA: Diagnosis not present

## 2023-02-12 DIAGNOSIS — L89310 Pressure ulcer of right buttock, unstageable: Secondary | ICD-10-CM | POA: Diagnosis not present

## 2023-02-12 DIAGNOSIS — L89320 Pressure ulcer of left buttock, unstageable: Secondary | ICD-10-CM | POA: Diagnosis not present

## 2023-02-12 DIAGNOSIS — L03116 Cellulitis of left lower limb: Secondary | ICD-10-CM | POA: Diagnosis not present

## 2023-02-12 DIAGNOSIS — J9611 Chronic respiratory failure with hypoxia: Secondary | ICD-10-CM | POA: Diagnosis not present

## 2023-02-15 ENCOUNTER — Telehealth: Payer: Self-pay | Admitting: Cardiology

## 2023-02-15 DIAGNOSIS — J9611 Chronic respiratory failure with hypoxia: Secondary | ICD-10-CM | POA: Diagnosis not present

## 2023-02-15 DIAGNOSIS — L03116 Cellulitis of left lower limb: Secondary | ICD-10-CM | POA: Diagnosis not present

## 2023-02-15 DIAGNOSIS — U071 COVID-19: Secondary | ICD-10-CM | POA: Diagnosis not present

## 2023-02-15 DIAGNOSIS — L89310 Pressure ulcer of right buttock, unstageable: Secondary | ICD-10-CM | POA: Diagnosis not present

## 2023-02-15 DIAGNOSIS — L89320 Pressure ulcer of left buttock, unstageable: Secondary | ICD-10-CM | POA: Diagnosis not present

## 2023-02-15 DIAGNOSIS — N179 Acute kidney failure, unspecified: Secondary | ICD-10-CM | POA: Diagnosis not present

## 2023-02-15 DIAGNOSIS — J439 Emphysema, unspecified: Secondary | ICD-10-CM | POA: Diagnosis not present

## 2023-02-15 DIAGNOSIS — J441 Chronic obstructive pulmonary disease with (acute) exacerbation: Secondary | ICD-10-CM | POA: Diagnosis not present

## 2023-02-15 DIAGNOSIS — S81802A Unspecified open wound, left lower leg, initial encounter: Secondary | ICD-10-CM | POA: Diagnosis not present

## 2023-02-15 MED ORDER — TORSEMIDE 20 MG PO TABS: ORAL_TABLET | ORAL | Status: DC

## 2023-02-15 NOTE — Telephone Encounter (Signed)
 Pt c/o swelling/edema: STAT if pt has developed SOB within 24 hours  If swelling, where is the swelling located? legs  How much weight have you gained and in what time span? 7lbs in 6 days  Have you gained 2 pounds in a day or 5 pounds in a week? yes  Do you have a log of your daily weights (if so, list)? Pt has it  Are you currently taking a fluid pill? yes  Are you currently SOB? no  Have you traveled recently in a car or plane for an extended period of time? no

## 2023-02-15 NOTE — Telephone Encounter (Signed)
 Spoke with Dondra Spry from Home health who states that the pt has gained 7 pounds in a week. Denies increased shortness of breath but does have increased swelling in her lower extremities. BP 118/74 HR 70 and sat 97 % on 2 liters per her normal. Please advise

## 2023-02-15 NOTE — Telephone Encounter (Signed)
 Recommendations reviewed with pt as per Dr. Kem Parkinson note.  Pt verbalized understanding and had no additional questions.

## 2023-02-16 DIAGNOSIS — U071 COVID-19: Secondary | ICD-10-CM | POA: Diagnosis not present

## 2023-02-16 DIAGNOSIS — S81802A Unspecified open wound, left lower leg, initial encounter: Secondary | ICD-10-CM | POA: Diagnosis not present

## 2023-02-16 DIAGNOSIS — J9611 Chronic respiratory failure with hypoxia: Secondary | ICD-10-CM | POA: Diagnosis not present

## 2023-02-16 DIAGNOSIS — L89310 Pressure ulcer of right buttock, unstageable: Secondary | ICD-10-CM | POA: Diagnosis not present

## 2023-02-16 DIAGNOSIS — J439 Emphysema, unspecified: Secondary | ICD-10-CM | POA: Diagnosis not present

## 2023-02-16 DIAGNOSIS — L03116 Cellulitis of left lower limb: Secondary | ICD-10-CM | POA: Diagnosis not present

## 2023-02-16 DIAGNOSIS — L89320 Pressure ulcer of left buttock, unstageable: Secondary | ICD-10-CM | POA: Diagnosis not present

## 2023-02-16 DIAGNOSIS — N179 Acute kidney failure, unspecified: Secondary | ICD-10-CM | POA: Diagnosis not present

## 2023-02-16 DIAGNOSIS — J441 Chronic obstructive pulmonary disease with (acute) exacerbation: Secondary | ICD-10-CM | POA: Diagnosis not present

## 2023-02-17 DIAGNOSIS — L89320 Pressure ulcer of left buttock, unstageable: Secondary | ICD-10-CM | POA: Diagnosis not present

## 2023-02-17 DIAGNOSIS — M169 Osteoarthritis of hip, unspecified: Secondary | ICD-10-CM | POA: Diagnosis not present

## 2023-02-17 DIAGNOSIS — L03116 Cellulitis of left lower limb: Secondary | ICD-10-CM | POA: Diagnosis not present

## 2023-02-17 DIAGNOSIS — L89310 Pressure ulcer of right buttock, unstageable: Secondary | ICD-10-CM | POA: Diagnosis not present

## 2023-02-17 DIAGNOSIS — M19049 Primary osteoarthritis, unspecified hand: Secondary | ICD-10-CM | POA: Diagnosis not present

## 2023-02-17 DIAGNOSIS — J441 Chronic obstructive pulmonary disease with (acute) exacerbation: Secondary | ICD-10-CM | POA: Diagnosis not present

## 2023-02-17 DIAGNOSIS — N179 Acute kidney failure, unspecified: Secondary | ICD-10-CM | POA: Diagnosis not present

## 2023-02-17 DIAGNOSIS — M479 Spondylosis, unspecified: Secondary | ICD-10-CM | POA: Diagnosis not present

## 2023-02-17 DIAGNOSIS — M179 Osteoarthritis of knee, unspecified: Secondary | ICD-10-CM | POA: Diagnosis not present

## 2023-02-17 DIAGNOSIS — Z1389 Encounter for screening for other disorder: Secondary | ICD-10-CM | POA: Diagnosis not present

## 2023-02-17 DIAGNOSIS — J439 Emphysema, unspecified: Secondary | ICD-10-CM | POA: Diagnosis not present

## 2023-02-17 DIAGNOSIS — U071 COVID-19: Secondary | ICD-10-CM | POA: Diagnosis not present

## 2023-02-17 DIAGNOSIS — G894 Chronic pain syndrome: Secondary | ICD-10-CM | POA: Diagnosis not present

## 2023-02-17 DIAGNOSIS — S81802A Unspecified open wound, left lower leg, initial encounter: Secondary | ICD-10-CM | POA: Diagnosis not present

## 2023-02-17 DIAGNOSIS — J9611 Chronic respiratory failure with hypoxia: Secondary | ICD-10-CM | POA: Diagnosis not present

## 2023-02-17 DIAGNOSIS — M19019 Primary osteoarthritis, unspecified shoulder: Secondary | ICD-10-CM | POA: Diagnosis not present

## 2023-02-17 DIAGNOSIS — M2550 Pain in unspecified joint: Secondary | ICD-10-CM | POA: Diagnosis not present

## 2023-02-17 NOTE — Progress Notes (Signed)
Cardiology Office Note:  .   Date:  02/18/2023  ID:  Tracy Oconnor, DOB 05-03-1960, MRN [id number] PCP: Tracy Greenspan, MD  [facility] Providers Cardiologist:  Tracy Brothers, MD    History of Present Illness: .   Tracy Oconnor is a 63 y.o. female with a past medical history of hypertension, aortic atherosclerosis noted on CT imaging, COPD oxygen dependent, OSA, palpitations, bipolar disorder, hyperlipidemia, former smoker cessation 2022.  08/26/2022 echo EF 55 to 60%, no RWMA, mild concentric LVH, grade 1 DD, moderate MR, mild AR 08/26/2022 abdominal ultrasound 2.2 cm dilation of the proximal abdominal aorta 08/13/2022 left heart cath severe single-vessel CAD with CTO of distal RCA with left-right collaterals, moderate LCA disease  She was evaluated following her abnormal stress evaluation.  The decision was made to proceed with a left heart catheterization.  She underwent this on 08/13/2022 which revealed severe single-vessel CAD with CTO of the distal RCA with left-to-right collaterals, moderate LCA disease with 60% mid LAD stenosis.  She was started on isosorbide.  Evaluated by Dr. Tomie Oconnor on 12/01/2022, she was stable from a cardiac perspective and advised to follow-up in 9 months.  Admitted [facility] on 01/26/2023 to 01/29/2023, diagnosed with COVID-19, COPD exacerbation.  She was ultimately discharged home with home health services.  Her home health nurse contacted our office few days ago with concerns of increased weight gain, worsening pedal edema, she was advised to increase her diuretic and follow-up in office.  She presents today for follow-up after recent hospital discharge, she says she is feeling much better however bothered by pedal edema.  She has not seen her PCP yet for hospital follow-up.  She does have home health services coming to her home.  Her legs are very edematous today. She denies chest pain, palpitations, dyspnea, pnd, orthopnea, n, v, dizziness,  syncope,  or early satiety.   ROS: Review of Systems  Constitutional:  Positive for malaise/fatigue.  HENT: Negative.    Eyes: Negative.   Respiratory:  Positive for shortness of breath (baseline).   Cardiovascular:  Positive for leg swelling.  Gastrointestinal: Negative.   Genitourinary: Negative.   Musculoskeletal: Negative.   Skin: Negative.   Neurological: Negative.   Endo/Heme/Allergies:  Bruises/bleeds easily.  Psychiatric/Behavioral: Negative.      Studies Reviewed: .        Cardiac Studies & Procedures   CARDIAC CATHETERIZATION  CARDIAC CATHETERIZATION 08/13/2022  Narrative Conclusions: Severe single-vessel coronary artery disease with chronic total occlusion of distal RCA with robust left-to-right collaterals. Moderate left coronary artery disease with 60% mid LAD stenosis (RFR borderline at 0.90) and 50% D2 and OM2 stenoses. Normal left ventricular systolic function (LVEF 55-65%) with mildly elevated filling pressure (LVEDP 18 mmHg).  Recommendations: Add isosorbide mononitrate for antianginal therapy. Aggressive secondary prevention of coronary artery disease.  Tracy Kendall, MD [facility]  Findings Coronary Findings Diagnostic  Dominance: Right  Left Main Vessel is large. Vessel is angiographically normal.  Left Anterior Descending Mid LAD lesion is 60% stenosed. Pressure gradient was performed on the lesion using a GUIDEWIRE PRESSURE X 175 and CATH LAUNCHER 38F EBU3.5. RFR: 0.9.  First Diagonal Branch Vessel is moderate in size.  Second Diagonal Branch Vessel is moderate in size. 2nd Diag lesion is 50% stenosed.  Left Circumflex Vessel is moderate in size.  First Obtuse Marginal Branch Vessel is moderate in size.  Second Obtuse Marginal Branch Vessel is moderate in size. 2nd Mrg lesion is 50% stenosed.  Third  Obtuse Marginal Branch Vessel is small in size.  Right Coronary Artery Vessel is moderate in size. Mid RCA lesion is 60%  stenosed. Dist RCA-1 lesion is 100% stenosed. The lesion is chronically occluded with bridging collateral flow. Dist RCA-2 lesion is 100% stenosed. The lesion is chronically occluded with left-to-right collateral flow.  Right Posterior Descending Artery Collaterals RPDA filled by collaterals from Dist LAD.  First Right Posterolateral Branch Collaterals 1st RPL filled by collaterals from 2nd Sept.  Intervention  No interventions have been documented.   STRESS TESTS  MYOCARDIAL PERFUSION IMAGING 07/30/2022  Narrative   Findings are consistent with ischemia involving basal, mid and apical portion of the inferior wall. The study is intermediate risk secondary to mildly diminished EF.   No ST deviation was noted.   Left ventricular function is abnormal. Global function is mildly reduced. Nuclear stress EF: 49 %. The left ventricular ejection fraction is mildly decreased (45-54%). End diastolic cavity size is normal.   Prior study not available for comparison.  ECHOCARDIOGRAM  ECHOCARDIOGRAM COMPLETE 08/26/2022  Narrative ECHOCARDIOGRAM REPORT    Patient Name:   Tracy Oconnor Date of Exam: 08/26/2022 Medical Rec #:  [id number]        Height:       60.0 in Accession #:    [id number]       Weight:       166.0 lb Date of Birth:  Sep 21, 1960        BSA:          1.724 m Patient Age:    62 years         BP:           140/5988 mmHg Patient Gender: F                HR:           62 bpm. Exam Location:  [place]  Procedure: 2D Echo, Cardiac Doppler, Color Doppler and Strain Analysis  Indications:    DOE (dyspnea on exertion) [R06.09 (ICD-10-CM)]; Coronary artery disease involving native coronary artery of native heart without angina pectoris [I25.10 (ICD-10-CM)]  History:        Patient has no prior history of Echocardiogram examinations. Risk Factors:Hypertension and Dyslipidemia.  Sonographer:    Louie Boston RDCS Referring Phys: Tracy Oconnor [facility]  IMPRESSIONS   1. Left  ventricular ejection fraction, by estimation, is 55 to 60%. The left ventricle has normal function. The left ventricle has no regional wall motion abnormalities. There is mild concentric left ventricular hypertrophy. Left ventricular diastolic parameters are consistent with Grade I diastolic dysfunction (impaired relaxation). The average left ventricular global longitudinal strain is -13.8 %. The global longitudinal strain is abnormal. 2. Right ventricular systolic function is mildly reduced. The right ventricular size is normal. Mildly increased right ventricular wall thickness. Tricuspid regurgitation signal is inadequate for assessing PA pressure. 3. The mitral valve is degenerative. Moderate mitral valve regurgitation. No evidence of mitral stenosis. 4. The aortic valve is tricuspid. Aortic valve regurgitation is mild. No aortic stenosis is present. 5. Aortic Normal DTA.  FINDINGS Left Ventricle: Left ventricular ejection fraction, by estimation, is 55 to 60%. The left ventricle has normal function. The left ventricle has no regional wall motion abnormalities. The average left ventricular global longitudinal strain is -13.8 %. The global longitudinal strain is abnormal. The left ventricular internal cavity size was normal in size. There is mild concentric left ventricular hypertrophy. Left ventricular diastolic parameters are consistent with Grade  I diastolic dysfunction (impaired relaxation). Indeterminate filling pressures.  Right Ventricle: The right ventricular size is normal. Mildly increased right ventricular wall thickness. Right ventricular systolic function is mildly reduced. Tricuspid regurgitation signal is inadequate for assessing PA pressure.  Left Atrium: Left atrial size was normal in size.  Right Atrium: Right atrial size was normal in size.  Pericardium: There is no evidence of pericardial effusion. Presence of epicardial fat layer.  Mitral Valve: The mitral valve is  degenerative in appearance. Mild mitral annular calcification. Moderate mitral valve regurgitation. No evidence of mitral valve stenosis.  Tricuspid Valve: The tricuspid valve is normal in structure. Tricuspid valve regurgitation is trivial. No evidence of tricuspid stenosis.  Aortic Valve: The aortic valve is tricuspid. Aortic valve regurgitation is mild. No aortic stenosis is present.  Pulmonic Valve: The pulmonic valve was normal in structure. Pulmonic valve regurgitation is trivial. No evidence of pulmonic stenosis.  Aorta: The aortic root and ascending aorta are structurally normal, with no evidence of dilitation, the aortic arch was not well visualized and Normal DTA.  Venous: The pulmonary veins were not well visualized. The inferior vena cava was not well visualized.  IAS/Shunts: No atrial level shunt detected by color flow Doppler.   LEFT VENTRICLE PLAX 2D LVIDd:         4.40 cm   Diastology LVIDs:         3.10 cm   LV e' medial:    4.03 cm/s LV PW:         1.20 cm   LV E/e' medial:  16.1 LV IVS:        1.10 cm   LV e' lateral:   6.53 cm/s LVOT diam:     2.00 cm   LV E/e' lateral: 9.9 LV SV:         65 LV SV Index:   38        2D Longitudinal Strain LVOT Area:     3.14 cm  2D Strain GLS Avg:     -13.8 %   RIGHT VENTRICLE            IVC RV Basal diam:  2.70 cm    IVC diam: 1.30 cm RV S prime:     9.68 cm/s TAPSE (M-mode): 1.6 cm  LEFT ATRIUM             Index        RIGHT ATRIUM          Index LA diam:        3.60 cm 2.09 cm/m   RA Area:     9.79 cm LA Vol (A2C):   25.5 ml 14.79 ml/m  RA Volume:   19.40 ml 11.25 ml/m LA Vol (A4C):   23.1 ml 13.40 ml/m LA Biplane Vol: 24.3 ml 14.09 ml/m AORTIC VALVE LVOT Vmax:   103.25 cm/s LVOT Vmean:  63.150 cm/s LVOT VTI:    0.206 m  AORTA Ao Root diam: 2.80 cm Ao Asc diam:  3.50 cm Ao Desc diam: 2.40 cm  MR Peak grad:    81.0 mmHg    TRICUSPID VALVE MR Mean grad:    49.0 mmHg    TR Peak grad:   8.1 mmHg MR Vmax:          450.00 cm/s  TR Vmax:        142.00 cm/s MR Vmean:        322.0 cm/s MR PISA:  4.54 cm     SHUNTS MR PISA Eff ROA: 39 mm       Systemic VTI:  0.21 m MR PISA Radius:  0.85 cm      Systemic Diam: 2.00 cm MV E velocity: 64.75 cm/s MV A velocity: 83.85 cm/s MV E/A ratio:  0.77  Norman Herrlich MD Electronically signed by Norman Herrlich MD Signature Date/Time: 08/26/2022/12:37:05 PM    Final             Risk Assessment/Calculations:             Physical Exam:   VS:  BP 126/84   Pulse 76   Ht 4\' 11"  (1.499 m)   Wt 171 lb (77.6 kg)   SpO2 94%   BMI 34.54 kg/m    Wt Readings from Last 3 Encounters:  02/18/23 171 lb (77.6 kg)  12/28/22 166 lb 9.6 oz (75.6 kg)  12/01/22 157 lb (71.2 kg)    GEN: No acute distress NECK: No JVD; No carotid bruits CARDIAC: RRR, no murmurs, rubs, gallops RESPIRATORY:  diminished in bases, expiratory wheezing ABDOMEN: Soft, non-tender, non-distended EXTREMITIES:  +2 pedal edema  ASSESSMENT AND PLAN: .   Coronary artery disease -left heart cath revealed severe single-vessel CAD with CTO of distal RCA with left-right collaterals, moderate LCA disease. Stable with no anginal symptoms. No indication for ischemic evaluation.  Continue aspirin 81 mg daily, continue Lipitor 20 mg daily, continue isosorbide 30 mg daily, continue nitroglycerin as needed.  Hypertensive heart disease with heart failure - NYHA class II, lungs are clear but her legs are very edematous and she was recently advised to increase her diuretic discharge which she just darted yesterday.  Continue Jardiance 10 mg daily, continue losartan 25 mg daily, continue Demadex currently taking 40 mg in the morning and 20 mg in the afternoon.  Will check BMET, proBNP.  Hyperlipidemia/elevated LPa-most recent LDL was well-controlled at 62 on 08/12/2022, continue Lipitor 20 mg daily.  LPA 191.   Hypertension-blood pressure is controlled at 126/84. Continue losartan 25 mg daily.   COPD -former  smoker complete cessation x 2 years, followed by Dr. Winfred Leeds.  She is on oxygen 3 L continuous.  Moderate mitral regurgitation-noted on most recent echo.     Dispo: BMET, CBC today, increase torsemide to 20 mg in am and 10 mg in pm, repeat BMET in 1 week. Return in 6 weeks.   Signed, Flossie Dibble, NP

## 2023-02-18 ENCOUNTER — Other Ambulatory Visit: Payer: Self-pay | Admitting: *Deleted

## 2023-02-18 ENCOUNTER — Ambulatory Visit: Payer: Medicare HMO | Attending: Cardiology | Admitting: Cardiology

## 2023-02-18 ENCOUNTER — Encounter: Payer: Self-pay | Admitting: Cardiology

## 2023-02-18 VITALS — BP 126/84 | HR 76 | Ht 59.0 in | Wt 171.0 lb

## 2023-02-18 DIAGNOSIS — R0609 Other forms of dyspnea: Secondary | ICD-10-CM

## 2023-02-18 DIAGNOSIS — I2510 Atherosclerotic heart disease of native coronary artery without angina pectoris: Secondary | ICD-10-CM | POA: Diagnosis not present

## 2023-02-18 DIAGNOSIS — I5032 Chronic diastolic (congestive) heart failure: Secondary | ICD-10-CM | POA: Diagnosis not present

## 2023-02-18 DIAGNOSIS — E7841 Elevated Lipoprotein(a): Secondary | ICD-10-CM | POA: Diagnosis not present

## 2023-02-18 DIAGNOSIS — I10 Essential (primary) hypertension: Secondary | ICD-10-CM

## 2023-02-18 DIAGNOSIS — I340 Nonrheumatic mitral (valve) insufficiency: Secondary | ICD-10-CM | POA: Diagnosis not present

## 2023-02-18 DIAGNOSIS — E785 Hyperlipidemia, unspecified: Secondary | ICD-10-CM

## 2023-02-18 MED ORDER — NITROGLYCERIN 0.4 MG SL SUBL: 0.4000 mg | SUBLINGUAL_TABLET | SUBLINGUAL | 3 refills | Status: DC | PRN

## 2023-02-18 NOTE — Patient Instructions (Signed)
Medication Instructions:  Your physician recommends that you continue on your current medications as directed. Please refer to the Current Medication list given to you today.  *If you need a refill on your cardiac medications before your next appointment, please call your pharmacy*   Lab Work: Your physician recommends that you return for lab work in: Today for BMP and ProBNP  If you have labs (blood work) drawn today and your tests are completely normal, you will receive your results only by: MyChart Message (if you have MyChart) OR A paper copy in the mail If you have any lab test that is abnormal or we need to change your treatment, we will call you to review the results.   Testing/Procedures: NONE   Follow-Up: At [facility], you and your health needs are our priority.  As part of our continuing mission to provide you with exceptional heart care, we have created designated Provider Care Teams.  These Care Teams include your primary Cardiologist (physician) and Advanced Practice Providers (APPs -  Physician Assistants and Nurse Practitioners) who all work together to provide you with the care you need, when you need it.  We recommend signing up for the patient portal called "MyChart".  Sign up information is provided on this After Visit Summary.  MyChart is used to connect with patients for Virtual Visits (Telemedicine).  Patients are able to view lab/test results, encounter notes, upcoming appointments, etc.  Non-urgent messages can be sent to your provider as well.   To learn more about what you can do with MyChart, go to [email or web address].    Your next appointment:   1 month(s)  Provider:   Wallis Bamberg, NP Rosalita Levan)    Other Instructions

## 2023-02-19 DIAGNOSIS — U071 COVID-19: Secondary | ICD-10-CM | POA: Diagnosis not present

## 2023-02-19 DIAGNOSIS — J439 Emphysema, unspecified: Secondary | ICD-10-CM | POA: Diagnosis not present

## 2023-02-19 DIAGNOSIS — J441 Chronic obstructive pulmonary disease with (acute) exacerbation: Secondary | ICD-10-CM | POA: Diagnosis not present

## 2023-02-19 DIAGNOSIS — L03116 Cellulitis of left lower limb: Secondary | ICD-10-CM | POA: Diagnosis not present

## 2023-02-19 DIAGNOSIS — S81802A Unspecified open wound, left lower leg, initial encounter: Secondary | ICD-10-CM | POA: Diagnosis not present

## 2023-02-19 DIAGNOSIS — L89320 Pressure ulcer of left buttock, unstageable: Secondary | ICD-10-CM | POA: Diagnosis not present

## 2023-02-19 DIAGNOSIS — L89310 Pressure ulcer of right buttock, unstageable: Secondary | ICD-10-CM | POA: Diagnosis not present

## 2023-02-19 DIAGNOSIS — N179 Acute kidney failure, unspecified: Secondary | ICD-10-CM | POA: Diagnosis not present

## 2023-02-19 DIAGNOSIS — J9611 Chronic respiratory failure with hypoxia: Secondary | ICD-10-CM | POA: Diagnosis not present

## 2023-02-19 LAB — BASIC METABOLIC PANEL WITH GFR
BUN/Creatinine Ratio: 12 (ref 12–28)
BUN: 13 mg/dL (ref 8–27)
CO2: 21 mmol/L (ref 20–29)
Calcium: 9.6 mg/dL (ref 8.7–10.3)
Chloride: 100 mmol/L (ref 96–106)
Creatinine, Ser: 1.13 mg/dL — ABNORMAL HIGH (ref 0.57–1.00)
Glucose: 78 mg/dL (ref 70–99)
Potassium: 5 mmol/L (ref 3.5–5.2)
Sodium: 139 mmol/L (ref 134–144)
eGFR: 55 mL/min/1.73 — ABNORMAL LOW

## 2023-02-19 LAB — PRO B NATRIURETIC PEPTIDE: NT-Pro BNP: 548 pg/mL — ABNORMAL HIGH (ref 0–287)

## 2023-02-22 DIAGNOSIS — S81802A Unspecified open wound, left lower leg, initial encounter: Secondary | ICD-10-CM | POA: Diagnosis not present

## 2023-02-22 DIAGNOSIS — J441 Chronic obstructive pulmonary disease with (acute) exacerbation: Secondary | ICD-10-CM | POA: Diagnosis not present

## 2023-02-22 DIAGNOSIS — J9611 Chronic respiratory failure with hypoxia: Secondary | ICD-10-CM | POA: Diagnosis not present

## 2023-02-22 DIAGNOSIS — L89320 Pressure ulcer of left buttock, unstageable: Secondary | ICD-10-CM | POA: Diagnosis not present

## 2023-02-22 DIAGNOSIS — J439 Emphysema, unspecified: Secondary | ICD-10-CM | POA: Diagnosis not present

## 2023-02-22 DIAGNOSIS — L03116 Cellulitis of left lower limb: Secondary | ICD-10-CM | POA: Diagnosis not present

## 2023-02-22 DIAGNOSIS — U071 COVID-19: Secondary | ICD-10-CM | POA: Diagnosis not present

## 2023-02-22 DIAGNOSIS — N179 Acute kidney failure, unspecified: Secondary | ICD-10-CM | POA: Diagnosis not present

## 2023-02-22 DIAGNOSIS — L89310 Pressure ulcer of right buttock, unstageable: Secondary | ICD-10-CM | POA: Diagnosis not present

## 2023-02-23 DIAGNOSIS — L89320 Pressure ulcer of left buttock, unstageable: Secondary | ICD-10-CM | POA: Diagnosis not present

## 2023-02-23 DIAGNOSIS — J439 Emphysema, unspecified: Secondary | ICD-10-CM | POA: Diagnosis not present

## 2023-02-23 DIAGNOSIS — U071 COVID-19: Secondary | ICD-10-CM | POA: Diagnosis not present

## 2023-02-23 DIAGNOSIS — J441 Chronic obstructive pulmonary disease with (acute) exacerbation: Secondary | ICD-10-CM | POA: Diagnosis not present

## 2023-02-23 DIAGNOSIS — N179 Acute kidney failure, unspecified: Secondary | ICD-10-CM | POA: Diagnosis not present

## 2023-02-23 DIAGNOSIS — J9611 Chronic respiratory failure with hypoxia: Secondary | ICD-10-CM | POA: Diagnosis not present

## 2023-02-23 DIAGNOSIS — L03116 Cellulitis of left lower limb: Secondary | ICD-10-CM | POA: Diagnosis not present

## 2023-02-23 DIAGNOSIS — S81802A Unspecified open wound, left lower leg, initial encounter: Secondary | ICD-10-CM | POA: Diagnosis not present

## 2023-02-23 DIAGNOSIS — L89310 Pressure ulcer of right buttock, unstageable: Secondary | ICD-10-CM | POA: Diagnosis not present

## 2023-02-25 DIAGNOSIS — M1711 Unilateral primary osteoarthritis, right knee: Secondary | ICD-10-CM | POA: Diagnosis not present

## 2023-02-26 DIAGNOSIS — J439 Emphysema, unspecified: Secondary | ICD-10-CM | POA: Diagnosis not present

## 2023-02-26 DIAGNOSIS — J441 Chronic obstructive pulmonary disease with (acute) exacerbation: Secondary | ICD-10-CM | POA: Diagnosis not present

## 2023-02-26 DIAGNOSIS — L89320 Pressure ulcer of left buttock, unstageable: Secondary | ICD-10-CM | POA: Diagnosis not present

## 2023-02-26 DIAGNOSIS — U071 COVID-19: Secondary | ICD-10-CM | POA: Diagnosis not present

## 2023-02-26 DIAGNOSIS — L89310 Pressure ulcer of right buttock, unstageable: Secondary | ICD-10-CM | POA: Diagnosis not present

## 2023-02-26 DIAGNOSIS — J9611 Chronic respiratory failure with hypoxia: Secondary | ICD-10-CM | POA: Diagnosis not present

## 2023-02-26 DIAGNOSIS — L03116 Cellulitis of left lower limb: Secondary | ICD-10-CM | POA: Diagnosis not present

## 2023-02-26 DIAGNOSIS — N179 Acute kidney failure, unspecified: Secondary | ICD-10-CM | POA: Diagnosis not present

## 2023-02-26 DIAGNOSIS — S81802A Unspecified open wound, left lower leg, initial encounter: Secondary | ICD-10-CM | POA: Diagnosis not present

## 2023-02-27 DIAGNOSIS — J9611 Chronic respiratory failure with hypoxia: Secondary | ICD-10-CM | POA: Diagnosis not present

## 2023-02-27 DIAGNOSIS — L89310 Pressure ulcer of right buttock, unstageable: Secondary | ICD-10-CM | POA: Diagnosis not present

## 2023-02-27 DIAGNOSIS — J439 Emphysema, unspecified: Secondary | ICD-10-CM | POA: Diagnosis not present

## 2023-02-27 DIAGNOSIS — L89320 Pressure ulcer of left buttock, unstageable: Secondary | ICD-10-CM | POA: Diagnosis not present

## 2023-02-27 DIAGNOSIS — L03116 Cellulitis of left lower limb: Secondary | ICD-10-CM | POA: Diagnosis not present

## 2023-02-27 DIAGNOSIS — J441 Chronic obstructive pulmonary disease with (acute) exacerbation: Secondary | ICD-10-CM | POA: Diagnosis not present

## 2023-02-27 DIAGNOSIS — N179 Acute kidney failure, unspecified: Secondary | ICD-10-CM | POA: Diagnosis not present

## 2023-02-27 DIAGNOSIS — U071 COVID-19: Secondary | ICD-10-CM | POA: Diagnosis not present

## 2023-02-27 DIAGNOSIS — S81802A Unspecified open wound, left lower leg, initial encounter: Secondary | ICD-10-CM | POA: Diagnosis not present

## 2023-03-01 DIAGNOSIS — J441 Chronic obstructive pulmonary disease with (acute) exacerbation: Secondary | ICD-10-CM | POA: Diagnosis not present

## 2023-03-01 DIAGNOSIS — J439 Emphysema, unspecified: Secondary | ICD-10-CM | POA: Diagnosis not present

## 2023-03-01 DIAGNOSIS — S81802A Unspecified open wound, left lower leg, initial encounter: Secondary | ICD-10-CM | POA: Diagnosis not present

## 2023-03-01 DIAGNOSIS — L03116 Cellulitis of left lower limb: Secondary | ICD-10-CM | POA: Diagnosis not present

## 2023-03-01 DIAGNOSIS — N179 Acute kidney failure, unspecified: Secondary | ICD-10-CM | POA: Diagnosis not present

## 2023-03-01 DIAGNOSIS — J9611 Chronic respiratory failure with hypoxia: Secondary | ICD-10-CM | POA: Diagnosis not present

## 2023-03-01 DIAGNOSIS — L89320 Pressure ulcer of left buttock, unstageable: Secondary | ICD-10-CM | POA: Diagnosis not present

## 2023-03-01 DIAGNOSIS — L89310 Pressure ulcer of right buttock, unstageable: Secondary | ICD-10-CM | POA: Diagnosis not present

## 2023-03-01 DIAGNOSIS — U071 COVID-19: Secondary | ICD-10-CM | POA: Diagnosis not present

## 2023-03-02 DIAGNOSIS — L89310 Pressure ulcer of right buttock, unstageable: Secondary | ICD-10-CM | POA: Diagnosis not present

## 2023-03-02 DIAGNOSIS — J441 Chronic obstructive pulmonary disease with (acute) exacerbation: Secondary | ICD-10-CM | POA: Diagnosis not present

## 2023-03-02 DIAGNOSIS — J9611 Chronic respiratory failure with hypoxia: Secondary | ICD-10-CM | POA: Diagnosis not present

## 2023-03-02 DIAGNOSIS — J439 Emphysema, unspecified: Secondary | ICD-10-CM | POA: Diagnosis not present

## 2023-03-02 DIAGNOSIS — S81802A Unspecified open wound, left lower leg, initial encounter: Secondary | ICD-10-CM | POA: Diagnosis not present

## 2023-03-02 DIAGNOSIS — L03116 Cellulitis of left lower limb: Secondary | ICD-10-CM | POA: Diagnosis not present

## 2023-03-02 DIAGNOSIS — U071 COVID-19: Secondary | ICD-10-CM | POA: Diagnosis not present

## 2023-03-02 DIAGNOSIS — L89320 Pressure ulcer of left buttock, unstageable: Secondary | ICD-10-CM | POA: Diagnosis not present

## 2023-03-02 DIAGNOSIS — N179 Acute kidney failure, unspecified: Secondary | ICD-10-CM | POA: Diagnosis not present

## 2023-03-03 DIAGNOSIS — S81802A Unspecified open wound, left lower leg, initial encounter: Secondary | ICD-10-CM | POA: Diagnosis not present

## 2023-03-03 DIAGNOSIS — E66811 Obesity, class 1: Secondary | ICD-10-CM | POA: Diagnosis not present

## 2023-03-03 DIAGNOSIS — J439 Emphysema, unspecified: Secondary | ICD-10-CM | POA: Diagnosis not present

## 2023-03-03 DIAGNOSIS — N179 Acute kidney failure, unspecified: Secondary | ICD-10-CM | POA: Diagnosis not present

## 2023-03-03 DIAGNOSIS — J9611 Chronic respiratory failure with hypoxia: Secondary | ICD-10-CM | POA: Diagnosis not present

## 2023-03-03 DIAGNOSIS — I110 Hypertensive heart disease with heart failure: Secondary | ICD-10-CM | POA: Diagnosis not present

## 2023-03-03 DIAGNOSIS — L03116 Cellulitis of left lower limb: Secondary | ICD-10-CM | POA: Diagnosis not present

## 2023-03-03 DIAGNOSIS — J441 Chronic obstructive pulmonary disease with (acute) exacerbation: Secondary | ICD-10-CM | POA: Diagnosis not present

## 2023-03-03 DIAGNOSIS — M150 Primary generalized (osteo)arthritis: Secondary | ICD-10-CM | POA: Diagnosis not present

## 2023-03-04 DIAGNOSIS — E66811 Obesity, class 1: Secondary | ICD-10-CM | POA: Diagnosis not present

## 2023-03-04 DIAGNOSIS — L03116 Cellulitis of left lower limb: Secondary | ICD-10-CM | POA: Diagnosis not present

## 2023-03-04 DIAGNOSIS — J441 Chronic obstructive pulmonary disease with (acute) exacerbation: Secondary | ICD-10-CM | POA: Diagnosis not present

## 2023-03-04 DIAGNOSIS — M150 Primary generalized (osteo)arthritis: Secondary | ICD-10-CM | POA: Diagnosis not present

## 2023-03-04 DIAGNOSIS — I110 Hypertensive heart disease with heart failure: Secondary | ICD-10-CM | POA: Diagnosis not present

## 2023-03-04 DIAGNOSIS — J9611 Chronic respiratory failure with hypoxia: Secondary | ICD-10-CM | POA: Diagnosis not present

## 2023-03-04 DIAGNOSIS — S81802A Unspecified open wound, left lower leg, initial encounter: Secondary | ICD-10-CM | POA: Diagnosis not present

## 2023-03-04 DIAGNOSIS — N179 Acute kidney failure, unspecified: Secondary | ICD-10-CM | POA: Diagnosis not present

## 2023-03-04 DIAGNOSIS — J439 Emphysema, unspecified: Secondary | ICD-10-CM | POA: Diagnosis not present

## 2023-03-05 DIAGNOSIS — J9611 Chronic respiratory failure with hypoxia: Secondary | ICD-10-CM | POA: Diagnosis not present

## 2023-03-05 DIAGNOSIS — N179 Acute kidney failure, unspecified: Secondary | ICD-10-CM | POA: Diagnosis not present

## 2023-03-05 DIAGNOSIS — L03116 Cellulitis of left lower limb: Secondary | ICD-10-CM | POA: Diagnosis not present

## 2023-03-05 DIAGNOSIS — J441 Chronic obstructive pulmonary disease with (acute) exacerbation: Secondary | ICD-10-CM | POA: Diagnosis not present

## 2023-03-05 DIAGNOSIS — J439 Emphysema, unspecified: Secondary | ICD-10-CM | POA: Diagnosis not present

## 2023-03-05 DIAGNOSIS — E66811 Obesity, class 1: Secondary | ICD-10-CM | POA: Diagnosis not present

## 2023-03-05 DIAGNOSIS — I110 Hypertensive heart disease with heart failure: Secondary | ICD-10-CM | POA: Diagnosis not present

## 2023-03-05 DIAGNOSIS — M150 Primary generalized (osteo)arthritis: Secondary | ICD-10-CM | POA: Diagnosis not present

## 2023-03-05 DIAGNOSIS — S81802A Unspecified open wound, left lower leg, initial encounter: Secondary | ICD-10-CM | POA: Diagnosis not present

## 2023-03-06 DIAGNOSIS — K567 Ileus, unspecified: Secondary | ICD-10-CM | POA: Diagnosis not present

## 2023-03-06 DIAGNOSIS — F319 Bipolar disorder, unspecified: Secondary | ICD-10-CM | POA: Diagnosis not present

## 2023-03-08 ENCOUNTER — Telehealth: Payer: Self-pay | Admitting: *Deleted

## 2023-03-08 DIAGNOSIS — F319 Bipolar disorder, unspecified: Secondary | ICD-10-CM | POA: Diagnosis not present

## 2023-03-08 DIAGNOSIS — R32 Unspecified urinary incontinence: Secondary | ICD-10-CM | POA: Diagnosis not present

## 2023-03-08 MED ORDER — EMPAGLIFLOZIN 10 MG PO TABS: 10.0000 mg | ORAL_TABLET | Freq: Every day | ORAL | 3 refills | Status: DC

## 2023-03-08 NOTE — Telephone Encounter (Signed)
 Rx refill sent to pharmacy.

## 2023-03-09 DIAGNOSIS — J439 Emphysema, unspecified: Secondary | ICD-10-CM | POA: Diagnosis not present

## 2023-03-09 DIAGNOSIS — N179 Acute kidney failure, unspecified: Secondary | ICD-10-CM | POA: Diagnosis not present

## 2023-03-09 DIAGNOSIS — J441 Chronic obstructive pulmonary disease with (acute) exacerbation: Secondary | ICD-10-CM | POA: Diagnosis not present

## 2023-03-09 DIAGNOSIS — M150 Primary generalized (osteo)arthritis: Secondary | ICD-10-CM | POA: Diagnosis not present

## 2023-03-09 DIAGNOSIS — E66811 Obesity, class 1: Secondary | ICD-10-CM | POA: Diagnosis not present

## 2023-03-09 DIAGNOSIS — I110 Hypertensive heart disease with heart failure: Secondary | ICD-10-CM | POA: Diagnosis not present

## 2023-03-09 DIAGNOSIS — J9611 Chronic respiratory failure with hypoxia: Secondary | ICD-10-CM | POA: Diagnosis not present

## 2023-03-09 DIAGNOSIS — L03116 Cellulitis of left lower limb: Secondary | ICD-10-CM | POA: Diagnosis not present

## 2023-03-09 DIAGNOSIS — S81802A Unspecified open wound, left lower leg, initial encounter: Secondary | ICD-10-CM | POA: Diagnosis not present

## 2023-03-10 DIAGNOSIS — L03116 Cellulitis of left lower limb: Secondary | ICD-10-CM | POA: Diagnosis not present

## 2023-03-10 DIAGNOSIS — I110 Hypertensive heart disease with heart failure: Secondary | ICD-10-CM | POA: Diagnosis not present

## 2023-03-10 DIAGNOSIS — N179 Acute kidney failure, unspecified: Secondary | ICD-10-CM | POA: Diagnosis not present

## 2023-03-10 DIAGNOSIS — E66811 Obesity, class 1: Secondary | ICD-10-CM | POA: Diagnosis not present

## 2023-03-10 DIAGNOSIS — J439 Emphysema, unspecified: Secondary | ICD-10-CM | POA: Diagnosis not present

## 2023-03-10 DIAGNOSIS — J441 Chronic obstructive pulmonary disease with (acute) exacerbation: Secondary | ICD-10-CM | POA: Diagnosis not present

## 2023-03-10 DIAGNOSIS — M150 Primary generalized (osteo)arthritis: Secondary | ICD-10-CM | POA: Diagnosis not present

## 2023-03-10 DIAGNOSIS — J9611 Chronic respiratory failure with hypoxia: Secondary | ICD-10-CM | POA: Diagnosis not present

## 2023-03-10 DIAGNOSIS — S81802A Unspecified open wound, left lower leg, initial encounter: Secondary | ICD-10-CM | POA: Diagnosis not present

## 2023-03-11 DIAGNOSIS — J441 Chronic obstructive pulmonary disease with (acute) exacerbation: Secondary | ICD-10-CM | POA: Diagnosis not present

## 2023-03-11 DIAGNOSIS — E66811 Obesity, class 1: Secondary | ICD-10-CM | POA: Diagnosis not present

## 2023-03-11 DIAGNOSIS — I110 Hypertensive heart disease with heart failure: Secondary | ICD-10-CM | POA: Diagnosis not present

## 2023-03-11 DIAGNOSIS — L03116 Cellulitis of left lower limb: Secondary | ICD-10-CM | POA: Diagnosis not present

## 2023-03-11 DIAGNOSIS — J9611 Chronic respiratory failure with hypoxia: Secondary | ICD-10-CM | POA: Diagnosis not present

## 2023-03-11 DIAGNOSIS — N179 Acute kidney failure, unspecified: Secondary | ICD-10-CM | POA: Diagnosis not present

## 2023-03-11 DIAGNOSIS — S81802A Unspecified open wound, left lower leg, initial encounter: Secondary | ICD-10-CM | POA: Diagnosis not present

## 2023-03-11 DIAGNOSIS — J439 Emphysema, unspecified: Secondary | ICD-10-CM | POA: Diagnosis not present

## 2023-03-11 DIAGNOSIS — M150 Primary generalized (osteo)arthritis: Secondary | ICD-10-CM | POA: Diagnosis not present

## 2023-03-15 DIAGNOSIS — M150 Primary generalized (osteo)arthritis: Secondary | ICD-10-CM | POA: Diagnosis not present

## 2023-03-15 DIAGNOSIS — N179 Acute kidney failure, unspecified: Secondary | ICD-10-CM | POA: Diagnosis not present

## 2023-03-15 DIAGNOSIS — J9611 Chronic respiratory failure with hypoxia: Secondary | ICD-10-CM | POA: Diagnosis not present

## 2023-03-15 DIAGNOSIS — S81802A Unspecified open wound, left lower leg, initial encounter: Secondary | ICD-10-CM | POA: Diagnosis not present

## 2023-03-15 DIAGNOSIS — M479 Spondylosis, unspecified: Secondary | ICD-10-CM | POA: Diagnosis not present

## 2023-03-15 DIAGNOSIS — M19019 Primary osteoarthritis, unspecified shoulder: Secondary | ICD-10-CM | POA: Diagnosis not present

## 2023-03-15 DIAGNOSIS — G894 Chronic pain syndrome: Secondary | ICD-10-CM | POA: Diagnosis not present

## 2023-03-15 DIAGNOSIS — M2550 Pain in unspecified joint: Secondary | ICD-10-CM | POA: Diagnosis not present

## 2023-03-15 DIAGNOSIS — I10 Essential (primary) hypertension: Secondary | ICD-10-CM | POA: Diagnosis not present

## 2023-03-15 DIAGNOSIS — I110 Hypertensive heart disease with heart failure: Secondary | ICD-10-CM | POA: Diagnosis not present

## 2023-03-15 DIAGNOSIS — M169 Osteoarthritis of hip, unspecified: Secondary | ICD-10-CM | POA: Diagnosis not present

## 2023-03-15 DIAGNOSIS — J439 Emphysema, unspecified: Secondary | ICD-10-CM | POA: Diagnosis not present

## 2023-03-15 DIAGNOSIS — L03116 Cellulitis of left lower limb: Secondary | ICD-10-CM | POA: Diagnosis not present

## 2023-03-15 DIAGNOSIS — M179 Osteoarthritis of knee, unspecified: Secondary | ICD-10-CM | POA: Diagnosis not present

## 2023-03-15 DIAGNOSIS — Z79899 Other long term (current) drug therapy: Secondary | ICD-10-CM | POA: Diagnosis not present

## 2023-03-15 DIAGNOSIS — M19049 Primary osteoarthritis, unspecified hand: Secondary | ICD-10-CM | POA: Diagnosis not present

## 2023-03-15 DIAGNOSIS — J441 Chronic obstructive pulmonary disease with (acute) exacerbation: Secondary | ICD-10-CM | POA: Diagnosis not present

## 2023-03-15 DIAGNOSIS — Z1389 Encounter for screening for other disorder: Secondary | ICD-10-CM | POA: Diagnosis not present

## 2023-03-15 DIAGNOSIS — E785 Hyperlipidemia, unspecified: Secondary | ICD-10-CM | POA: Diagnosis not present

## 2023-03-15 DIAGNOSIS — E66811 Obesity, class 1: Secondary | ICD-10-CM | POA: Diagnosis not present

## 2023-03-16 DIAGNOSIS — J441 Chronic obstructive pulmonary disease with (acute) exacerbation: Secondary | ICD-10-CM | POA: Diagnosis not present

## 2023-03-16 DIAGNOSIS — L03116 Cellulitis of left lower limb: Secondary | ICD-10-CM | POA: Diagnosis not present

## 2023-03-16 DIAGNOSIS — J9611 Chronic respiratory failure with hypoxia: Secondary | ICD-10-CM | POA: Diagnosis not present

## 2023-03-16 DIAGNOSIS — E66811 Obesity, class 1: Secondary | ICD-10-CM | POA: Diagnosis not present

## 2023-03-16 DIAGNOSIS — J439 Emphysema, unspecified: Secondary | ICD-10-CM | POA: Diagnosis not present

## 2023-03-16 DIAGNOSIS — N179 Acute kidney failure, unspecified: Secondary | ICD-10-CM | POA: Diagnosis not present

## 2023-03-16 DIAGNOSIS — I110 Hypertensive heart disease with heart failure: Secondary | ICD-10-CM | POA: Diagnosis not present

## 2023-03-16 DIAGNOSIS — M150 Primary generalized (osteo)arthritis: Secondary | ICD-10-CM | POA: Diagnosis not present

## 2023-03-16 DIAGNOSIS — S81802A Unspecified open wound, left lower leg, initial encounter: Secondary | ICD-10-CM | POA: Diagnosis not present

## 2023-03-17 ENCOUNTER — Telehealth: Payer: Self-pay

## 2023-03-17 DIAGNOSIS — J441 Chronic obstructive pulmonary disease with (acute) exacerbation: Secondary | ICD-10-CM | POA: Diagnosis not present

## 2023-03-17 DIAGNOSIS — J439 Emphysema, unspecified: Secondary | ICD-10-CM | POA: Diagnosis not present

## 2023-03-17 DIAGNOSIS — J9611 Chronic respiratory failure with hypoxia: Secondary | ICD-10-CM | POA: Diagnosis not present

## 2023-03-17 DIAGNOSIS — N179 Acute kidney failure, unspecified: Secondary | ICD-10-CM | POA: Diagnosis not present

## 2023-03-17 DIAGNOSIS — I110 Hypertensive heart disease with heart failure: Secondary | ICD-10-CM | POA: Diagnosis not present

## 2023-03-17 DIAGNOSIS — L03116 Cellulitis of left lower limb: Secondary | ICD-10-CM | POA: Diagnosis not present

## 2023-03-17 DIAGNOSIS — S81802A Unspecified open wound, left lower leg, initial encounter: Secondary | ICD-10-CM | POA: Diagnosis not present

## 2023-03-17 DIAGNOSIS — M150 Primary generalized (osteo)arthritis: Secondary | ICD-10-CM | POA: Diagnosis not present

## 2023-03-17 DIAGNOSIS — E66811 Obesity, class 1: Secondary | ICD-10-CM | POA: Diagnosis not present

## 2023-03-17 NOTE — Telephone Encounter (Signed)
[facility] mail order pharmacy is requesting a reill on torsemide 20 mg tablets. This medication was D/C at last office visit with Tracy Bamberg, NP that stated she wanted pt to continue this medication. Does pt still suppose to be taking this medication? Please address

## 2023-03-18 DIAGNOSIS — M150 Primary generalized (osteo)arthritis: Secondary | ICD-10-CM | POA: Diagnosis not present

## 2023-03-18 DIAGNOSIS — S81802A Unspecified open wound, left lower leg, initial encounter: Secondary | ICD-10-CM | POA: Diagnosis not present

## 2023-03-18 DIAGNOSIS — J9611 Chronic respiratory failure with hypoxia: Secondary | ICD-10-CM | POA: Diagnosis not present

## 2023-03-18 DIAGNOSIS — I110 Hypertensive heart disease with heart failure: Secondary | ICD-10-CM | POA: Diagnosis not present

## 2023-03-18 DIAGNOSIS — J441 Chronic obstructive pulmonary disease with (acute) exacerbation: Secondary | ICD-10-CM | POA: Diagnosis not present

## 2023-03-18 DIAGNOSIS — E66811 Obesity, class 1: Secondary | ICD-10-CM | POA: Diagnosis not present

## 2023-03-18 DIAGNOSIS — L03116 Cellulitis of left lower limb: Secondary | ICD-10-CM | POA: Diagnosis not present

## 2023-03-18 DIAGNOSIS — J439 Emphysema, unspecified: Secondary | ICD-10-CM | POA: Diagnosis not present

## 2023-03-18 DIAGNOSIS — N179 Acute kidney failure, unspecified: Secondary | ICD-10-CM | POA: Diagnosis not present

## 2023-03-19 MED ORDER — TORSEMIDE 20 MG PO TABS: 20.0000 mg | ORAL_TABLET | ORAL | 3 refills | Status: DC

## 2023-03-19 NOTE — Telephone Encounter (Signed)
Checked [employer] note and see that pt should be on Torsemide 20 mg in am and 10 mg in afternoon. Will send refill into [facility]

## 2023-03-21 NOTE — Progress Notes (Deleted)
 Cardiology Office Note:  .   Date:  03/21/2023  ID:  Tracy Oconnor, DOB 23-Apr-1960, MRN [id number] PCP: Abner Greenspan, MD  [facility] Providers Cardiologist:  Garwin Brothers, MD    History of Present Illness: .   Tracy Oconnor is a 63 y.o. female with a past medical history of hypertension, aortic atherosclerosis noted on CT imaging, COPD oxygen dependent, OSA, palpitations, bipolar disorder, hyperlipidemia, former smoker cessation 2022.   08/26/2022 echo EF 55 to 60%, no RWMA, mild concentric LVH, grade 1 DD, moderate MR, mild AR 08/26/2022 abdominal ultrasound 2.2 cm dilation of the proximal abdominal aorta 08/13/2022 left heart cath severe single-vessel CAD with CTO of distal RCA with left-right collaterals, moderate LCA disease >> isosorbide started  She was evaluated following her abnormal stress evaluation.  The decision was made to proceed with a left heart catheterization.  She underwent this on 08/13/2022 which revealed severe single-vessel CAD with CTO of the distal RCA with left-to-right collaterals, moderate LCA disease with 60% mid LAD stenosis.  She was started on isosorbide.  Evaluated by Dr. Tomie China on 12/01/2022, she was stable from a cardiac perspective and advised to follow-up in 9 months.  Admitted [facility] on 01/26/2023 to 01/29/2023, diagnosed with COVID-19, COPD exacerbation.  She was ultimately discharged home with home health services.  Her home health nurse contacted our office few days ago with concerns of increased weight gain, worsening pedal edema, she was advised to increase her diuretic and follow-up in office.  Evaluated 02/18/2023 for follow-up after hospitalization as outlined above, she was feeling better but still bothered by pedal edema.  We doubled her Demadex started her on Jardiance and advised to follow-up in 6 weeks.  02/18/2023 weight was 171 labs are stable, repeat BMET ROS: Review of Systems  Constitutional:  Positive for  malaise/fatigue.  HENT: Negative.    Eyes: Negative.   Respiratory:  Positive for shortness of breath (baseline).   Cardiovascular:  Positive for leg swelling.  Gastrointestinal: Negative.   Genitourinary: Negative.   Musculoskeletal: Negative.   Skin: Negative.   Neurological: Negative.   Endo/Heme/Allergies:  Bruises/bleeds easily.  Psychiatric/Behavioral: Negative.      Studies Reviewed: .        Cardiac Studies & Procedures   ______________________________________________________________________________________________ CARDIAC CATHETERIZATION  CARDIAC CATHETERIZATION 08/13/2022  Narrative Conclusions: Severe single-vessel coronary artery disease with chronic total occlusion of distal RCA with robust left-to-right collaterals. Moderate left coronary artery disease with 60% mid LAD stenosis (RFR borderline at 0.90) and 50% D2 and OM2 stenoses. Normal left ventricular systolic function (LVEF 55-65%) with mildly elevated filling pressure (LVEDP 18 mmHg).  Recommendations: Add isosorbide mononitrate for antianginal therapy. Aggressive secondary prevention of coronary artery disease.  Yvonne Kendall, MD [facility]  Findings Coronary Findings Diagnostic  Dominance: Right  Left Main Vessel is large. Vessel is angiographically normal.  Left Anterior Descending Mid LAD lesion is 60% stenosed. Pressure gradient was performed on the lesion using a GUIDEWIRE PRESSURE X 175 and CATH LAUNCHER 12F EBU3.5. RFR: 0.9.  First Diagonal Branch Vessel is moderate in size.  Second Diagonal Branch Vessel is moderate in size. 2nd Diag lesion is 50% stenosed.  Left Circumflex Vessel is moderate in size.  First Obtuse Marginal Branch Vessel is moderate in size.  Second Obtuse Marginal Branch Vessel is moderate in size. 2nd Mrg lesion is 50% stenosed.  Third Obtuse Marginal Branch Vessel is small in size.  Right Coronary Artery Vessel is moderate in size. Mid  RCA lesion  is 60% stenosed. Dist RCA-1 lesion is 100% stenosed. The lesion is chronically occluded with bridging collateral flow. Dist RCA-2 lesion is 100% stenosed. The lesion is chronically occluded with left-to-right collateral flow.  Right Posterior Descending Artery Collaterals RPDA filled by collaterals from Dist LAD.  First Right Posterolateral Branch Collaterals 1st RPL filled by collaterals from 2nd Sept.  Intervention  No interventions have been documented.   STRESS TESTS  MYOCARDIAL PERFUSION IMAGING 07/30/2022  Narrative   Findings are consistent with ischemia involving basal, mid and apical portion of the inferior wall. The study is intermediate risk secondary to mildly diminished EF.   No ST deviation was noted.   Left ventricular function is abnormal. Global function is mildly reduced. Nuclear stress EF: 49 %. The left ventricular ejection fraction is mildly decreased (45-54%). End diastolic cavity size is normal.   Prior study not available for comparison.   ECHOCARDIOGRAM  ECHOCARDIOGRAM COMPLETE 08/26/2022  Narrative ECHOCARDIOGRAM REPORT    Patient Name:   Tracy Oconnor Date of Exam: 08/26/2022 Medical Rec #:  [id number]        Height:       60.0 in Accession #:    [id number]       Weight:       166.0 lb Date of Birth:  December 27, 1960        BSA:          1.724 m Patient Age:    62 years         BP:           140/5988 mmHg Patient Gender: F                HR:           62 bpm. Exam Location:  [place]  Procedure: 2D Echo, Cardiac Doppler, Color Doppler and Strain Analysis  Indications:    DOE (dyspnea on exertion) [R06.09 (ICD-10-CM)]; Coronary artery disease involving native coronary artery of native heart without angina pectoris [I25.10 (ICD-10-CM)]  History:        Patient has no prior history of Echocardiogram examinations. Risk Factors:Hypertension and Dyslipidemia.  Sonographer:    Louie Boston RDCS Referring Phys: Rito Ehrlich  [facility]  IMPRESSIONS   1. Left ventricular ejection fraction, by estimation, is 55 to 60%. The left ventricle has normal function. The left ventricle has no regional wall motion abnormalities. There is mild concentric left ventricular hypertrophy. Left ventricular diastolic parameters are consistent with Grade I diastolic dysfunction (impaired relaxation). The average left ventricular global longitudinal strain is -13.8 %. The global longitudinal strain is abnormal. 2. Right ventricular systolic function is mildly reduced. The right ventricular size is normal. Mildly increased right ventricular wall thickness. Tricuspid regurgitation signal is inadequate for assessing PA pressure. 3. The mitral valve is degenerative. Moderate mitral valve regurgitation. No evidence of mitral stenosis. 4. The aortic valve is tricuspid. Aortic valve regurgitation is mild. No aortic stenosis is present. 5. Aortic Normal DTA.  FINDINGS Left Ventricle: Left ventricular ejection fraction, by estimation, is 55 to 60%. The left ventricle has normal function. The left ventricle has no regional wall motion abnormalities. The average left ventricular global longitudinal strain is -13.8 %. The global longitudinal strain is abnormal. The left ventricular internal cavity size was normal in size. There is mild concentric left ventricular hypertrophy. Left ventricular diastolic parameters are consistent with Grade I diastolic dysfunction (impaired relaxation). Indeterminate filling pressures.  Right Ventricle: The right ventricular size is normal.  Mildly increased right ventricular wall thickness. Right ventricular systolic function is mildly reduced. Tricuspid regurgitation signal is inadequate for assessing PA pressure.  Left Atrium: Left atrial size was normal in size.  Right Atrium: Right atrial size was normal in size.  Pericardium: There is no evidence of pericardial effusion. Presence of epicardial fat  layer.  Mitral Valve: The mitral valve is degenerative in appearance. Mild mitral annular calcification. Moderate mitral valve regurgitation. No evidence of mitral valve stenosis.  Tricuspid Valve: The tricuspid valve is normal in structure. Tricuspid valve regurgitation is trivial. No evidence of tricuspid stenosis.  Aortic Valve: The aortic valve is tricuspid. Aortic valve regurgitation is mild. No aortic stenosis is present.  Pulmonic Valve: The pulmonic valve was normal in structure. Pulmonic valve regurgitation is trivial. No evidence of pulmonic stenosis.  Aorta: The aortic root and ascending aorta are structurally normal, with no evidence of dilitation, the aortic arch was not well visualized and Normal DTA.  Venous: The pulmonary veins were not well visualized. The inferior vena cava was not well visualized.  IAS/Shunts: No atrial level shunt detected by color flow Doppler.   LEFT VENTRICLE PLAX 2D LVIDd:         4.40 cm   Diastology LVIDs:         3.10 cm   LV e' medial:    4.03 cm/s LV PW:         1.20 cm   LV E/e' medial:  16.1 LV IVS:        1.10 cm   LV e' lateral:   6.53 cm/s LVOT diam:     2.00 cm   LV E/e' lateral: 9.9 LV SV:         65 LV SV Index:   38        2D Longitudinal Strain LVOT Area:     3.14 cm  2D Strain GLS Avg:     -13.8 %   RIGHT VENTRICLE            IVC RV Basal diam:  2.70 cm    IVC diam: 1.30 cm RV S prime:     9.68 cm/s TAPSE (M-mode): 1.6 cm  LEFT ATRIUM             Index        RIGHT ATRIUM          Index LA diam:        3.60 cm 2.09 cm/m   RA Area:     9.79 cm LA Vol (A2C):   25.5 ml 14.79 ml/m  RA Volume:   19.40 ml 11.25 ml/m LA Vol (A4C):   23.1 ml 13.40 ml/m LA Biplane Vol: 24.3 ml 14.09 ml/m AORTIC VALVE LVOT Vmax:   103.25 cm/s LVOT Vmean:  63.150 cm/s LVOT VTI:    0.206 m  AORTA Ao Root diam: 2.80 cm Ao Asc diam:  3.50 cm Ao Desc diam: 2.40 cm  MR Peak grad:    81.0 mmHg    TRICUSPID VALVE MR Mean grad:    49.0  mmHg    TR Peak grad:   8.1 mmHg MR Vmax:         450.00 cm/s  TR Vmax:        142.00 cm/s MR Vmean:        322.0 cm/s MR PISA:         4.54 cm     SHUNTS MR PISA Eff ROA: 39 mm  Systemic VTI:  0.21 m MR PISA Radius:  0.85 cm      Systemic Diam: 2.00 cm MV E velocity: 64.75 cm/s MV A velocity: 83.85 cm/s MV E/A ratio:  0.77  Norman Herrlich MD Electronically signed by Norman Herrlich MD Signature Date/Time: 08/26/2022/12:37:05 PM    Final          ______________________________________________________________________________________________      Risk Assessment/Calculations:     No BP recorded.  {Refresh Note OR Click here to enter BP  :1}***       Physical Exam:   VS:  There were no vitals taken for this visit.   Wt Readings from Last 3 Encounters:  02/18/23 171 lb (77.6 kg)  12/28/22 166 lb 9.6 oz (75.6 kg)  12/01/22 157 lb (71.2 kg)    GEN: No acute distress NECK: No JVD; No carotid bruits CARDIAC: RRR, no murmurs, rubs, gallops RESPIRATORY:  diminished in bases, expiratory wheezing ABDOMEN: Soft, non-tender, non-distended EXTREMITIES:  +2 pedal edema  ASSESSMENT AND PLAN: .   Coronary artery disease -left heart cath revealed severe single-vessel CAD with CTO of distal RCA with left-right collaterals, moderate LCA disease. Stable with no anginal symptoms. No indication for ischemic evaluation.  Continue aspirin 81 mg daily, continue Lipitor 20 mg daily, continue isosorbide 30 mg daily, continue nitroglycerin as needed.  Hypertensive heart disease with heart failure - NYHA class II, lungs are clear but her legs are very edematous and she was recently advised to increase her diuretic discharge which she just darted yesterday.  Continue Jardiance 10 mg daily, continue losartan 25 mg daily, continue Demadex currently taking 40 mg in the morning and 20 mg in the afternoon.  Will check BMET, proBNP.  Hyperlipidemia/elevated LPa-most recent LDL was well-controlled at 62 on  08/12/2022, continue Lipitor 20 mg daily.  LPA 191.   Hypertension-blood pressure is controlled at 126/84. Continue losartan 25 mg daily.   COPD -former smoker complete cessation x 2 years, followed by Dr. Winfred Leeds.  She is on oxygen 3 L continuous.  Moderate mitral regurgitation-noted on most recent echo.     Dispo: BMET, CBC today, increase torsemide to 20 mg in am and 10 mg in pm, repeat BMET in 1 week. Return in 6 weeks.   Signed, Flossie Dibble, NP

## 2023-03-22 ENCOUNTER — Ambulatory Visit: Payer: Medicare HMO | Admitting: Cardiology

## 2023-03-22 DIAGNOSIS — M150 Primary generalized (osteo)arthritis: Secondary | ICD-10-CM | POA: Diagnosis not present

## 2023-03-22 DIAGNOSIS — I110 Hypertensive heart disease with heart failure: Secondary | ICD-10-CM | POA: Diagnosis not present

## 2023-03-22 DIAGNOSIS — J441 Chronic obstructive pulmonary disease with (acute) exacerbation: Secondary | ICD-10-CM | POA: Diagnosis not present

## 2023-03-22 DIAGNOSIS — L03116 Cellulitis of left lower limb: Secondary | ICD-10-CM | POA: Diagnosis not present

## 2023-03-22 DIAGNOSIS — J439 Emphysema, unspecified: Secondary | ICD-10-CM | POA: Diagnosis not present

## 2023-03-22 DIAGNOSIS — J9611 Chronic respiratory failure with hypoxia: Secondary | ICD-10-CM | POA: Diagnosis not present

## 2023-03-22 DIAGNOSIS — N179 Acute kidney failure, unspecified: Secondary | ICD-10-CM | POA: Diagnosis not present

## 2023-03-22 DIAGNOSIS — S81802A Unspecified open wound, left lower leg, initial encounter: Secondary | ICD-10-CM | POA: Diagnosis not present

## 2023-03-22 DIAGNOSIS — E66811 Obesity, class 1: Secondary | ICD-10-CM | POA: Diagnosis not present

## 2023-03-26 DIAGNOSIS — L03116 Cellulitis of left lower limb: Secondary | ICD-10-CM | POA: Diagnosis not present

## 2023-03-26 DIAGNOSIS — I110 Hypertensive heart disease with heart failure: Secondary | ICD-10-CM | POA: Diagnosis not present

## 2023-03-26 DIAGNOSIS — J439 Emphysema, unspecified: Secondary | ICD-10-CM | POA: Diagnosis not present

## 2023-03-26 DIAGNOSIS — S81802A Unspecified open wound, left lower leg, initial encounter: Secondary | ICD-10-CM | POA: Diagnosis not present

## 2023-03-26 DIAGNOSIS — J441 Chronic obstructive pulmonary disease with (acute) exacerbation: Secondary | ICD-10-CM | POA: Diagnosis not present

## 2023-03-26 DIAGNOSIS — J9611 Chronic respiratory failure with hypoxia: Secondary | ICD-10-CM | POA: Diagnosis not present

## 2023-03-26 DIAGNOSIS — M150 Primary generalized (osteo)arthritis: Secondary | ICD-10-CM | POA: Diagnosis not present

## 2023-03-26 DIAGNOSIS — N179 Acute kidney failure, unspecified: Secondary | ICD-10-CM | POA: Diagnosis not present

## 2023-03-26 DIAGNOSIS — E66811 Obesity, class 1: Secondary | ICD-10-CM | POA: Diagnosis not present

## 2023-03-29 DIAGNOSIS — M150 Primary generalized (osteo)arthritis: Secondary | ICD-10-CM | POA: Diagnosis not present

## 2023-03-29 DIAGNOSIS — I110 Hypertensive heart disease with heart failure: Secondary | ICD-10-CM | POA: Diagnosis not present

## 2023-03-29 DIAGNOSIS — J439 Emphysema, unspecified: Secondary | ICD-10-CM | POA: Diagnosis not present

## 2023-03-29 DIAGNOSIS — E66811 Obesity, class 1: Secondary | ICD-10-CM | POA: Diagnosis not present

## 2023-03-29 DIAGNOSIS — L03116 Cellulitis of left lower limb: Secondary | ICD-10-CM | POA: Diagnosis not present

## 2023-03-29 DIAGNOSIS — S81802A Unspecified open wound, left lower leg, initial encounter: Secondary | ICD-10-CM | POA: Diagnosis not present

## 2023-03-29 DIAGNOSIS — N179 Acute kidney failure, unspecified: Secondary | ICD-10-CM | POA: Diagnosis not present

## 2023-03-29 DIAGNOSIS — J441 Chronic obstructive pulmonary disease with (acute) exacerbation: Secondary | ICD-10-CM | POA: Diagnosis not present

## 2023-03-29 DIAGNOSIS — J9611 Chronic respiratory failure with hypoxia: Secondary | ICD-10-CM | POA: Diagnosis not present

## 2023-03-30 DIAGNOSIS — S81802A Unspecified open wound, left lower leg, initial encounter: Secondary | ICD-10-CM | POA: Diagnosis not present

## 2023-03-30 DIAGNOSIS — M150 Primary generalized (osteo)arthritis: Secondary | ICD-10-CM | POA: Diagnosis not present

## 2023-03-30 DIAGNOSIS — J9611 Chronic respiratory failure with hypoxia: Secondary | ICD-10-CM | POA: Diagnosis not present

## 2023-03-30 DIAGNOSIS — J441 Chronic obstructive pulmonary disease with (acute) exacerbation: Secondary | ICD-10-CM | POA: Diagnosis not present

## 2023-03-30 DIAGNOSIS — L03116 Cellulitis of left lower limb: Secondary | ICD-10-CM | POA: Diagnosis not present

## 2023-03-30 DIAGNOSIS — J439 Emphysema, unspecified: Secondary | ICD-10-CM | POA: Diagnosis not present

## 2023-03-30 DIAGNOSIS — N179 Acute kidney failure, unspecified: Secondary | ICD-10-CM | POA: Diagnosis not present

## 2023-03-30 DIAGNOSIS — E66811 Obesity, class 1: Secondary | ICD-10-CM | POA: Diagnosis not present

## 2023-03-30 DIAGNOSIS — I110 Hypertensive heart disease with heart failure: Secondary | ICD-10-CM | POA: Diagnosis not present

## 2023-03-31 DIAGNOSIS — J441 Chronic obstructive pulmonary disease with (acute) exacerbation: Secondary | ICD-10-CM | POA: Diagnosis not present

## 2023-03-31 DIAGNOSIS — N179 Acute kidney failure, unspecified: Secondary | ICD-10-CM | POA: Diagnosis not present

## 2023-03-31 DIAGNOSIS — J9611 Chronic respiratory failure with hypoxia: Secondary | ICD-10-CM | POA: Diagnosis not present

## 2023-03-31 DIAGNOSIS — E66811 Obesity, class 1: Secondary | ICD-10-CM | POA: Diagnosis not present

## 2023-03-31 DIAGNOSIS — S81802A Unspecified open wound, left lower leg, initial encounter: Secondary | ICD-10-CM | POA: Diagnosis not present

## 2023-03-31 DIAGNOSIS — I110 Hypertensive heart disease with heart failure: Secondary | ICD-10-CM | POA: Diagnosis not present

## 2023-03-31 DIAGNOSIS — M150 Primary generalized (osteo)arthritis: Secondary | ICD-10-CM | POA: Diagnosis not present

## 2023-03-31 DIAGNOSIS — J439 Emphysema, unspecified: Secondary | ICD-10-CM | POA: Diagnosis not present

## 2023-03-31 DIAGNOSIS — L03116 Cellulitis of left lower limb: Secondary | ICD-10-CM | POA: Diagnosis not present

## 2023-04-02 DIAGNOSIS — L03116 Cellulitis of left lower limb: Secondary | ICD-10-CM | POA: Diagnosis not present

## 2023-04-02 DIAGNOSIS — E66811 Obesity, class 1: Secondary | ICD-10-CM | POA: Diagnosis not present

## 2023-04-02 DIAGNOSIS — M150 Primary generalized (osteo)arthritis: Secondary | ICD-10-CM | POA: Diagnosis not present

## 2023-04-02 DIAGNOSIS — J439 Emphysema, unspecified: Secondary | ICD-10-CM | POA: Diagnosis not present

## 2023-04-02 DIAGNOSIS — J441 Chronic obstructive pulmonary disease with (acute) exacerbation: Secondary | ICD-10-CM | POA: Diagnosis not present

## 2023-04-02 DIAGNOSIS — S81802A Unspecified open wound, left lower leg, initial encounter: Secondary | ICD-10-CM | POA: Diagnosis not present

## 2023-04-02 DIAGNOSIS — J9611 Chronic respiratory failure with hypoxia: Secondary | ICD-10-CM | POA: Diagnosis not present

## 2023-04-02 DIAGNOSIS — N179 Acute kidney failure, unspecified: Secondary | ICD-10-CM | POA: Diagnosis not present

## 2023-04-02 DIAGNOSIS — I110 Hypertensive heart disease with heart failure: Secondary | ICD-10-CM | POA: Diagnosis not present

## 2023-04-03 DIAGNOSIS — K567 Ileus, unspecified: Secondary | ICD-10-CM | POA: Diagnosis not present

## 2023-04-03 DIAGNOSIS — F319 Bipolar disorder, unspecified: Secondary | ICD-10-CM | POA: Diagnosis not present

## 2023-04-04 DIAGNOSIS — F319 Bipolar disorder, unspecified: Secondary | ICD-10-CM | POA: Diagnosis not present

## 2023-04-04 DIAGNOSIS — R32 Unspecified urinary incontinence: Secondary | ICD-10-CM | POA: Diagnosis not present

## 2023-04-04 NOTE — Progress Notes (Unsigned)
 Cardiology Office Note:  .   Date:  04/05/2023  ID:  Tracy Oconnor, DOB [birth year], MRN [id number] PCP: Tracy Greenspan, MD  [facility] Providers Cardiologist:  Tracy Brothers, MD    History of Present Illness: .   Tracy Oconnor is a 63 y.o. female with a past medical history of hypertension, CAD no intervention with CTO of distal RCA, HFpEF, COPD oxygen dependent, OSA, palpitations, bipolar disorder, hyperlipidemia, former smoker cessation 2022.   08/26/2022 echo EF 55 to 60%, no RWMA, mild concentric LVH, grade 1 DD, moderate MR, mild AR 08/26/2022 abdominal ultrasound 2.2 cm dilation of the proximal abdominal aorta 08/13/2022 left heart cath severe single-vessel CAD with CTO of distal RCA with left-right collaterals, moderate LCA disease >> isosorbide started  Evaluated by Dr. Tomie Oconnor on 12/01/2022, she was stable from a cardiac perspective and advised to follow-up in 9 months.  Admitted [facility] on 01/26/2023 to 01/29/2023, diagnosed with COVID-19, COPD exacerbation.  She was ultimately discharged home with home health services.  Her home health nurse contacted our office few days ago with concerns of increased weight gain, worsening pedal edema, she was advised to increase her diuretic and follow-up in office. Evaluated 02/18/2023 for follow-up after hospitalization as outlined above, she was feeling better but still bothered by pedal edema.  We doubled her Demadex started her on Jardiance and advised to follow-up in 6 weeks.  She presents today for follow-up of her heart failure after we adjusted her diuretics and started her on Jardiance.  She is feeling much better from that perspective.  She is still bothered by pedal edema but it is improved.  Her weight is down approximately 4 pounds.  She does report that she has had a yeast infection.  Her lab work has been checked after her diuresing, revealing creatinine 0.9, potassium 4.4.  She denies chest pain, palpitations, pnd,  orthopnea, n, v, dizziness, syncope, weight gain, or early satiety.   02/18/2023 weight was 171 labs are stable, repeat BMET  ROS: Review of Systems  Constitutional:  Positive for malaise/fatigue.  HENT: Negative.    Eyes: Negative.   Respiratory:  Positive for shortness of breath (baseline).   Cardiovascular:  Positive for leg swelling.  Gastrointestinal: Negative.   Genitourinary: Negative.   Musculoskeletal: Negative.   Skin: Negative.   Neurological: Negative.   Endo/Heme/Allergies:  Bruises/bleeds easily.  Psychiatric/Behavioral: Negative.      Studies Reviewed: .        Cardiac Studies & Procedures   ______________________________________________________________________________________________ CARDIAC CATHETERIZATION  CARDIAC CATHETERIZATION 08/13/2022  Narrative Conclusions: Severe single-vessel coronary artery disease with chronic total occlusion of distal RCA with robust left-to-right collaterals. Moderate left coronary artery disease with 60% mid LAD stenosis (RFR borderline at 0.90) and 50% D2 and OM2 stenoses. Normal left ventricular systolic function (LVEF 55-65%) with mildly elevated filling pressure (LVEDP 18 mmHg).  Recommendations: Add isosorbide mononitrate for antianginal therapy. Aggressive secondary prevention of coronary artery disease.  Tracy Kendall, MD [facility]  Findings Coronary Findings Diagnostic  Dominance: Right  Left Main Vessel is large. Vessel is angiographically normal.  Left Anterior Descending Mid LAD lesion is 60% stenosed. Pressure gradient was performed on the lesion using a GUIDEWIRE PRESSURE X 175 and CATH LAUNCHER 70F EBU3.5. RFR: 0.9.  First Diagonal Branch Vessel is moderate in size.  Second Diagonal Branch Vessel is moderate in size. 2nd Diag lesion is 50% stenosed.  Left Circumflex Vessel is moderate in size.  First Biomedical engineer  Vessel is moderate in size.  Second Obtuse Marginal Branch Vessel  is moderate in size. 2nd Mrg lesion is 50% stenosed.  Third Obtuse Marginal Branch Vessel is small in size.  Right Coronary Artery Vessel is moderate in size. Mid RCA lesion is 60% stenosed. Dist RCA-1 lesion is 100% stenosed. The lesion is chronically occluded with bridging collateral flow. Dist RCA-2 lesion is 100% stenosed. The lesion is chronically occluded with left-to-right collateral flow.  Right Posterior Descending Artery Collaterals RPDA filled by collaterals from Dist LAD.  First Right Posterolateral Branch Collaterals 1st RPL filled by collaterals from 2nd Sept.  Intervention  No interventions have been documented.   STRESS TESTS  MYOCARDIAL PERFUSION IMAGING 07/30/2022  Narrative   Findings are consistent with ischemia involving basal, mid and apical portion of the inferior wall. The study is intermediate risk secondary to mildly diminished EF.   No ST deviation was noted.   Left ventricular function is abnormal. Global function is mildly reduced. Nuclear stress EF: 49 %. The left ventricular ejection fraction is mildly decreased (45-54%). End diastolic cavity size is normal.   Prior study not available for comparison.   ECHOCARDIOGRAM  ECHOCARDIOGRAM COMPLETE 08/26/2022  Narrative ECHOCARDIOGRAM REPORT    Patient Name:   Tracy Oconnor Date of Exam: 08/26/2022 Medical Rec #:  [id number]        Height:       60.0 in Accession #:    [id number]       Weight:       166.0 lb Date of Birth:  Jan 17, 1961        BSA:          1.724 m Patient Age:    62 years         BP:           140/5988 mmHg Patient Gender: F                HR:           62 bpm. Exam Location:  [place]  Procedure: 2D Echo, Cardiac Doppler, Color Doppler and Strain Analysis  Indications:    DOE (dyspnea on exertion) [R06.09 (ICD-10-CM)]; Coronary artery disease involving native coronary artery of native heart without angina pectoris [I25.10 (ICD-10-CM)]  History:        Patient has no  prior history of Echocardiogram examinations. Risk Factors:Hypertension and Dyslipidemia.  Sonographer:    Tracy Oconnor RDCS Referring Phys: Tracy Oconnor [facility]  IMPRESSIONS   1. Left ventricular ejection fraction, by estimation, is 55 to 60%. The left ventricle has normal function. The left ventricle has no regional wall motion abnormalities. There is mild concentric left ventricular hypertrophy. Left ventricular diastolic parameters are consistent with Grade I diastolic dysfunction (impaired relaxation). The average left ventricular global longitudinal strain is -13.8 %. The global longitudinal strain is abnormal. 2. Right ventricular systolic function is mildly reduced. The right ventricular size is normal. Mildly increased right ventricular wall thickness. Tricuspid regurgitation signal is inadequate for assessing PA pressure. 3. The mitral valve is degenerative. Moderate mitral valve regurgitation. No evidence of mitral stenosis. 4. The aortic valve is tricuspid. Aortic valve regurgitation is mild. No aortic stenosis is present. 5. Aortic Normal DTA.  FINDINGS Left Ventricle: Left ventricular ejection fraction, by estimation, is 55 to 60%. The left ventricle has normal function. The left ventricle has no regional wall motion abnormalities. The average left ventricular global longitudinal strain is -13.8 %. The global longitudinal strain is abnormal. The  left ventricular internal cavity size was normal in size. There is mild concentric left ventricular hypertrophy. Left ventricular diastolic parameters are consistent with Grade I diastolic dysfunction (impaired relaxation). Indeterminate filling pressures.  Right Ventricle: The right ventricular size is normal. Mildly increased right ventricular wall thickness. Right ventricular systolic function is mildly reduced. Tricuspid regurgitation signal is inadequate for assessing PA pressure.  Left Atrium: Left atrial size was normal in  size.  Right Atrium: Right atrial size was normal in size.  Pericardium: There is no evidence of pericardial effusion. Presence of epicardial fat layer.  Mitral Valve: The mitral valve is degenerative in appearance. Mild mitral annular calcification. Moderate mitral valve regurgitation. No evidence of mitral valve stenosis.  Tricuspid Valve: The tricuspid valve is normal in structure. Tricuspid valve regurgitation is trivial. No evidence of tricuspid stenosis.  Aortic Valve: The aortic valve is tricuspid. Aortic valve regurgitation is mild. No aortic stenosis is present.  Pulmonic Valve: The pulmonic valve was normal in structure. Pulmonic valve regurgitation is trivial. No evidence of pulmonic stenosis.  Aorta: The aortic root and ascending aorta are structurally normal, with no evidence of dilitation, the aortic arch was not well visualized and Normal DTA.  Venous: The pulmonary veins were not well visualized. The inferior vena cava was not well visualized.  IAS/Shunts: No atrial level shunt detected by color flow Doppler.   LEFT VENTRICLE PLAX 2D LVIDd:         4.40 cm   Diastology LVIDs:         3.10 cm   LV e' medial:    4.03 cm/s LV PW:         1.20 cm   LV E/e' medial:  16.1 LV IVS:        1.10 cm   LV e' lateral:   6.53 cm/s LVOT diam:     2.00 cm   LV E/e' lateral: 9.9 LV SV:         65 LV SV Index:   38        2D Longitudinal Strain LVOT Area:     3.14 cm  2D Strain GLS Avg:     -13.8 %   RIGHT VENTRICLE            IVC RV Basal diam:  2.70 cm    IVC diam: 1.30 cm RV S prime:     9.68 cm/s TAPSE (M-mode): 1.6 cm  LEFT ATRIUM             Index        RIGHT ATRIUM          Index LA diam:        3.60 cm 2.09 cm/m   RA Area:     9.79 cm LA Vol (A2C):   25.5 ml 14.79 ml/m  RA Volume:   19.40 ml 11.25 ml/m LA Vol (A4C):   23.1 ml 13.40 ml/m LA Biplane Vol: 24.3 ml 14.09 ml/m AORTIC VALVE LVOT Vmax:   103.25 cm/s LVOT Vmean:  63.150 cm/s LVOT VTI:    0.206  m  AORTA Ao Root diam: 2.80 cm Ao Asc diam:  3.50 cm Ao Desc diam: 2.40 cm  MR Peak grad:    81.0 mmHg    TRICUSPID VALVE MR Mean grad:    49.0 mmHg    TR Peak grad:   8.1 mmHg MR Vmax:         450.00 cm/s  TR Vmax:  142.00 cm/s MR Vmean:        322.0 cm/s MR PISA:         4.54 cm     SHUNTS MR PISA Eff ROA: 39 mm       Systemic VTI:  0.21 m MR PISA Radius:  0.85 cm      Systemic Diam: 2.00 cm MV E velocity: 64.75 cm/s MV A velocity: 83.85 cm/s MV E/A ratio:  0.77  Norman Herrlich MD Electronically signed by Norman Herrlich MD Signature Date/Time: 08/26/2022/12:37:05 PM    Final          ______________________________________________________________________________________________      Risk Assessment/Calculations:             Physical Exam:   VS:  BP 100/70 (BP Location: Right Arm, Patient Position: Sitting, Cuff Size: Normal)   Pulse 81   Ht 4\' 11"  (1.499 m)   Wt 168 lb (76.2 kg)   SpO2 93%   BMI 33.93 kg/m    Wt Readings from Last 3 Encounters:  04/05/23 168 lb (76.2 kg)  02/18/23 171 lb (77.6 kg)  12/28/22 166 lb 9.6 oz (75.6 kg)    GEN: No acute distress NECK: No JVD; No carotid bruits CARDIAC: RRR, no murmurs, rubs, gallops RESPIRATORY:  diminished in bases, expiratory wheezing ABDOMEN: Soft, non-tender, non-distended EXTREMITIES:  +2 pedal edema  ASSESSMENT AND PLAN: .   Coronary artery disease -left heart cath revealed severe single-vessel CAD with CTO of distal RCA with left-right collaterals, moderate LCA disease. Stable with no anginal symptoms. No indication for ischemic evaluation.  Continue aspirin 81 mg daily, continue Lipitor, continue isosorbide 30 mg daily, continue nitroglycerin as needed.  Hypertensive heart disease with heart failure - NYHA class II. Continue Jardiance 10 mg daily, continue losartan 25 mg daily, continue Demadex currently taking 40 mg in the morning and 20 mg in the afternoon.  Labs were repeated by her PCP after her  diuretic was increased revealing a creatinine 0.9, potassium 4.4.  She has also had yeast infections that started the Jardiance, advised her if she has another mycotic infection over the next few months to let me know and we would d/c her Jardiance.   Hyperlipidemia/elevated LPa-most recent LDL was elevated at 125 on 03/15/2023, she states she is compliant without any missed doses but does take her Lipitor in the morning. Will increase her Lipitor to 40 mg but to take in the evening. Recent LFTs were normal. LPA 191.   Hypertension-blood pressure is controlled at 100/70. Continue losartan 25 mg daily.   COPD -former smoker complete cessation x 2 years, followed by Dr. Winfred Leeds.  She is on oxygen 3 L continuous.  Moderate mitral regurgitation-noted on most recent echo, no indication for repeat imaging at this time.     Dispo: increase Lipitor to 40 mg daily, repeat FLP and LFTs in 8 weeks, follow up in 3 months.   Signed, Flossie Dibble, NP

## 2023-04-05 ENCOUNTER — Encounter: Payer: Self-pay | Admitting: Cardiology

## 2023-04-05 ENCOUNTER — Ambulatory Visit: Payer: Medicare HMO | Attending: Cardiology | Admitting: Cardiology

## 2023-04-05 VITALS — BP 100/70 | HR 81 | Ht 59.0 in | Wt 168.0 lb

## 2023-04-05 DIAGNOSIS — E7841 Elevated Lipoprotein(a): Secondary | ICD-10-CM

## 2023-04-05 DIAGNOSIS — I340 Nonrheumatic mitral (valve) insufficiency: Secondary | ICD-10-CM

## 2023-04-05 DIAGNOSIS — E785 Hyperlipidemia, unspecified: Secondary | ICD-10-CM

## 2023-04-05 DIAGNOSIS — G4736 Sleep related hypoventilation in conditions classified elsewhere: Secondary | ICD-10-CM | POA: Diagnosis not present

## 2023-04-05 DIAGNOSIS — G4733 Obstructive sleep apnea (adult) (pediatric): Secondary | ICD-10-CM | POA: Diagnosis not present

## 2023-04-05 DIAGNOSIS — J449 Chronic obstructive pulmonary disease, unspecified: Secondary | ICD-10-CM | POA: Diagnosis not present

## 2023-04-05 DIAGNOSIS — I2510 Atherosclerotic heart disease of native coronary artery without angina pectoris: Secondary | ICD-10-CM

## 2023-04-05 DIAGNOSIS — I5032 Chronic diastolic (congestive) heart failure: Secondary | ICD-10-CM | POA: Diagnosis not present

## 2023-04-05 DIAGNOSIS — J9611 Chronic respiratory failure with hypoxia: Secondary | ICD-10-CM | POA: Diagnosis not present

## 2023-04-05 DIAGNOSIS — I10 Essential (primary) hypertension: Secondary | ICD-10-CM | POA: Diagnosis not present

## 2023-04-05 MED ORDER — ATORVASTATIN CALCIUM 40 MG PO TABS: 40.0000 mg | ORAL_TABLET | Freq: Every day | ORAL | 3 refills | Status: DC

## 2023-04-05 MED ORDER — TORSEMIDE 20 MG PO TABS: 60.0000 mg | ORAL_TABLET | ORAL | 3 refills | Status: DC

## 2023-04-05 NOTE — Patient Instructions (Addendum)
 Medication Instructions:   INCREASE: Lipitor to 40mg  in the evening/bedtime  INCREASE: Demedex to 40mg  in the morning and 20mg  in the evening   Lab Work:  Your physician recommends that you return for lab work in: 8 weeks You need to have labs done when you are fasting.  You can come Monday through Friday 8:30 am to 12:00 pm and 1:15 to 4:30. You do not need to make an appointment as the order has already been placed. The labs you are going to have done are LFT and Lipids.    Testing/Procedures: None Ordered   Follow-Up: At [facility], you and your health needs are our priority.  As part of our continuing mission to provide you with exceptional heart care, we have created designated Provider Care Teams.  These Care Teams include your primary Cardiologist (physician) and Advanced Practice Providers (APPs -  Physician Assistants and Nurse Practitioners) who all work together to provide you with the care you need, when you need it.  We recommend signing up for the patient portal called "MyChart".  Sign up information is provided on this After Visit Summary.  MyChart is used to connect with patients for Virtual Visits (Telemedicine).  Patients are able to view lab/test results, encounter notes, upcoming appointments, etc.  Non-urgent messages can be sent to your provider as well.   To learn more about what you can do with MyChart, go to [email or web address].    Your next appointment:   3 month(s)  The format for your next appointment:   In Person  Provider:   Wallis Bamberg, NP   Other Instructions "if you get another yeast infection OR a UTI in the next 8 weeks, call us and let us know"

## 2023-04-06 DIAGNOSIS — S81802A Unspecified open wound, left lower leg, initial encounter: Secondary | ICD-10-CM | POA: Diagnosis not present

## 2023-04-06 DIAGNOSIS — M150 Primary generalized (osteo)arthritis: Secondary | ICD-10-CM | POA: Diagnosis not present

## 2023-04-06 DIAGNOSIS — J9611 Chronic respiratory failure with hypoxia: Secondary | ICD-10-CM | POA: Diagnosis not present

## 2023-04-06 DIAGNOSIS — N179 Acute kidney failure, unspecified: Secondary | ICD-10-CM | POA: Diagnosis not present

## 2023-04-06 DIAGNOSIS — L03116 Cellulitis of left lower limb: Secondary | ICD-10-CM | POA: Diagnosis not present

## 2023-04-06 DIAGNOSIS — J439 Emphysema, unspecified: Secondary | ICD-10-CM | POA: Diagnosis not present

## 2023-04-06 DIAGNOSIS — I110 Hypertensive heart disease with heart failure: Secondary | ICD-10-CM | POA: Diagnosis not present

## 2023-04-06 DIAGNOSIS — E66811 Obesity, class 1: Secondary | ICD-10-CM | POA: Diagnosis not present

## 2023-04-06 DIAGNOSIS — J441 Chronic obstructive pulmonary disease with (acute) exacerbation: Secondary | ICD-10-CM | POA: Diagnosis not present

## 2023-04-09 DIAGNOSIS — J9611 Chronic respiratory failure with hypoxia: Secondary | ICD-10-CM | POA: Diagnosis not present

## 2023-04-09 DIAGNOSIS — L03116 Cellulitis of left lower limb: Secondary | ICD-10-CM | POA: Diagnosis not present

## 2023-04-09 DIAGNOSIS — N179 Acute kidney failure, unspecified: Secondary | ICD-10-CM | POA: Diagnosis not present

## 2023-04-09 DIAGNOSIS — J439 Emphysema, unspecified: Secondary | ICD-10-CM | POA: Diagnosis not present

## 2023-04-09 DIAGNOSIS — J441 Chronic obstructive pulmonary disease with (acute) exacerbation: Secondary | ICD-10-CM | POA: Diagnosis not present

## 2023-04-09 DIAGNOSIS — M150 Primary generalized (osteo)arthritis: Secondary | ICD-10-CM | POA: Diagnosis not present

## 2023-04-09 DIAGNOSIS — I110 Hypertensive heart disease with heart failure: Secondary | ICD-10-CM | POA: Diagnosis not present

## 2023-04-09 DIAGNOSIS — S81802A Unspecified open wound, left lower leg, initial encounter: Secondary | ICD-10-CM | POA: Diagnosis not present

## 2023-04-09 DIAGNOSIS — E66811 Obesity, class 1: Secondary | ICD-10-CM | POA: Diagnosis not present

## 2023-04-12 DIAGNOSIS — M19049 Primary osteoarthritis, unspecified hand: Secondary | ICD-10-CM | POA: Diagnosis not present

## 2023-04-12 DIAGNOSIS — M2550 Pain in unspecified joint: Secondary | ICD-10-CM | POA: Diagnosis not present

## 2023-04-12 DIAGNOSIS — G894 Chronic pain syndrome: Secondary | ICD-10-CM | POA: Diagnosis not present

## 2023-04-12 DIAGNOSIS — M179 Osteoarthritis of knee, unspecified: Secondary | ICD-10-CM | POA: Diagnosis not present

## 2023-04-12 DIAGNOSIS — M169 Osteoarthritis of hip, unspecified: Secondary | ICD-10-CM | POA: Diagnosis not present

## 2023-04-12 DIAGNOSIS — Z1389 Encounter for screening for other disorder: Secondary | ICD-10-CM | POA: Diagnosis not present

## 2023-04-12 DIAGNOSIS — M19019 Primary osteoarthritis, unspecified shoulder: Secondary | ICD-10-CM | POA: Diagnosis not present

## 2023-04-12 DIAGNOSIS — M479 Spondylosis, unspecified: Secondary | ICD-10-CM | POA: Diagnosis not present

## 2023-04-13 DIAGNOSIS — J449 Chronic obstructive pulmonary disease, unspecified: Secondary | ICD-10-CM | POA: Diagnosis not present

## 2023-04-13 DIAGNOSIS — J9611 Chronic respiratory failure with hypoxia: Secondary | ICD-10-CM | POA: Diagnosis not present

## 2023-04-13 DIAGNOSIS — I110 Hypertensive heart disease with heart failure: Secondary | ICD-10-CM | POA: Diagnosis not present

## 2023-04-13 DIAGNOSIS — J441 Chronic obstructive pulmonary disease with (acute) exacerbation: Secondary | ICD-10-CM | POA: Diagnosis not present

## 2023-04-13 DIAGNOSIS — S81802A Unspecified open wound, left lower leg, initial encounter: Secondary | ICD-10-CM | POA: Diagnosis not present

## 2023-04-13 DIAGNOSIS — G4736 Sleep related hypoventilation in conditions classified elsewhere: Secondary | ICD-10-CM | POA: Diagnosis not present

## 2023-04-13 DIAGNOSIS — N179 Acute kidney failure, unspecified: Secondary | ICD-10-CM | POA: Diagnosis not present

## 2023-04-13 DIAGNOSIS — G4733 Obstructive sleep apnea (adult) (pediatric): Secondary | ICD-10-CM | POA: Diagnosis not present

## 2023-04-13 DIAGNOSIS — J439 Emphysema, unspecified: Secondary | ICD-10-CM | POA: Diagnosis not present

## 2023-04-13 DIAGNOSIS — L03116 Cellulitis of left lower limb: Secondary | ICD-10-CM | POA: Diagnosis not present

## 2023-04-13 DIAGNOSIS — E66811 Obesity, class 1: Secondary | ICD-10-CM | POA: Diagnosis not present

## 2023-04-13 DIAGNOSIS — M150 Primary generalized (osteo)arthritis: Secondary | ICD-10-CM | POA: Diagnosis not present

## 2023-04-13 DIAGNOSIS — J301 Allergic rhinitis due to pollen: Secondary | ICD-10-CM | POA: Diagnosis not present

## 2023-04-14 DIAGNOSIS — E66811 Obesity, class 1: Secondary | ICD-10-CM | POA: Diagnosis not present

## 2023-04-14 DIAGNOSIS — J441 Chronic obstructive pulmonary disease with (acute) exacerbation: Secondary | ICD-10-CM | POA: Diagnosis not present

## 2023-04-14 DIAGNOSIS — L03116 Cellulitis of left lower limb: Secondary | ICD-10-CM | POA: Diagnosis not present

## 2023-04-14 DIAGNOSIS — I110 Hypertensive heart disease with heart failure: Secondary | ICD-10-CM | POA: Diagnosis not present

## 2023-04-14 DIAGNOSIS — J439 Emphysema, unspecified: Secondary | ICD-10-CM | POA: Diagnosis not present

## 2023-04-14 DIAGNOSIS — S81802A Unspecified open wound, left lower leg, initial encounter: Secondary | ICD-10-CM | POA: Diagnosis not present

## 2023-04-14 DIAGNOSIS — J9611 Chronic respiratory failure with hypoxia: Secondary | ICD-10-CM | POA: Diagnosis not present

## 2023-04-14 DIAGNOSIS — N179 Acute kidney failure, unspecified: Secondary | ICD-10-CM | POA: Diagnosis not present

## 2023-04-14 DIAGNOSIS — M150 Primary generalized (osteo)arthritis: Secondary | ICD-10-CM | POA: Diagnosis not present

## 2023-04-15 DIAGNOSIS — J441 Chronic obstructive pulmonary disease with (acute) exacerbation: Secondary | ICD-10-CM | POA: Diagnosis not present

## 2023-04-15 DIAGNOSIS — I110 Hypertensive heart disease with heart failure: Secondary | ICD-10-CM | POA: Diagnosis not present

## 2023-04-15 DIAGNOSIS — E66811 Obesity, class 1: Secondary | ICD-10-CM | POA: Diagnosis not present

## 2023-04-15 DIAGNOSIS — J9611 Chronic respiratory failure with hypoxia: Secondary | ICD-10-CM | POA: Diagnosis not present

## 2023-04-15 DIAGNOSIS — L03116 Cellulitis of left lower limb: Secondary | ICD-10-CM | POA: Diagnosis not present

## 2023-04-15 DIAGNOSIS — J439 Emphysema, unspecified: Secondary | ICD-10-CM | POA: Diagnosis not present

## 2023-04-15 DIAGNOSIS — S81802A Unspecified open wound, left lower leg, initial encounter: Secondary | ICD-10-CM | POA: Diagnosis not present

## 2023-04-15 DIAGNOSIS — N179 Acute kidney failure, unspecified: Secondary | ICD-10-CM | POA: Diagnosis not present

## 2023-04-15 DIAGNOSIS — M150 Primary generalized (osteo)arthritis: Secondary | ICD-10-CM | POA: Diagnosis not present

## 2023-04-20 DIAGNOSIS — K529 Noninfective gastroenteritis and colitis, unspecified: Secondary | ICD-10-CM | POA: Diagnosis not present

## 2023-04-20 DIAGNOSIS — N3000 Acute cystitis without hematuria: Secondary | ICD-10-CM | POA: Diagnosis not present

## 2023-04-20 DIAGNOSIS — J9811 Atelectasis: Secondary | ICD-10-CM | POA: Diagnosis not present

## 2023-04-20 DIAGNOSIS — R109 Unspecified abdominal pain: Secondary | ICD-10-CM | POA: Diagnosis not present

## 2023-04-20 DIAGNOSIS — J811 Chronic pulmonary edema: Secondary | ICD-10-CM | POA: Diagnosis not present

## 2023-04-21 ENCOUNTER — Telehealth: Payer: Self-pay | Admitting: Cardiology

## 2023-04-21 MED ORDER — ATORVASTATIN CALCIUM 40 MG PO TABS: 40.0000 mg | ORAL_TABLET | Freq: Every day | ORAL | 3 refills | Status: DC

## 2023-04-21 NOTE — Telephone Encounter (Signed)
 Refill sent.

## 2023-04-21 NOTE — Telephone Encounter (Signed)
*  STAT* If patient is at the pharmacy, call can be transferred to refill team.   1. Which medications need to be refilled? (please list name of each medication and dose if known)   atorvastatin (LIPITOR) 40 MG tablet     2. Would you like to learn more about the convenience, safety, & potential cost savings by using the [facility]? No     3. Are you open to using the [facility] (Type [facility]. ). No   4. Which pharmacy/location (including street and city if local pharmacy) is medication to be sent to? [facility] - [place], [place] - [address]    5. Do they need a 30 day or 90 day supply? 90 day

## 2023-04-21 NOTE — Telephone Encounter (Signed)
 Pt c/o medication issue:  1. Name of Medication: empagliflozin (JARDIANCE) 10 MG TABS tablet   2. How are you currently taking this medication (dosage and times per day)? 10 mg, Daily before breakfast   3. Are you having a reaction (difficulty breathing--STAT)? no  4. What is your medication issue? Pt is wanting to know if she should continue taking medication due to recent dx of UTI. Pt requesting cb to further discuss

## 2023-04-21 NOTE — Telephone Encounter (Signed)
 Spoke with pt and advised per Dr. Bing Matter to stop Jardiance x 2 weeks until UTI is treated and cleared. Pt verbalized understanding and had no further questions.

## 2023-04-22 DIAGNOSIS — E66811 Obesity, class 1: Secondary | ICD-10-CM | POA: Diagnosis not present

## 2023-04-22 DIAGNOSIS — J9611 Chronic respiratory failure with hypoxia: Secondary | ICD-10-CM | POA: Diagnosis not present

## 2023-04-22 DIAGNOSIS — M150 Primary generalized (osteo)arthritis: Secondary | ICD-10-CM | POA: Diagnosis not present

## 2023-04-22 DIAGNOSIS — J441 Chronic obstructive pulmonary disease with (acute) exacerbation: Secondary | ICD-10-CM | POA: Diagnosis not present

## 2023-04-22 DIAGNOSIS — S81802A Unspecified open wound, left lower leg, initial encounter: Secondary | ICD-10-CM | POA: Diagnosis not present

## 2023-04-22 DIAGNOSIS — I110 Hypertensive heart disease with heart failure: Secondary | ICD-10-CM | POA: Diagnosis not present

## 2023-04-22 DIAGNOSIS — L03116 Cellulitis of left lower limb: Secondary | ICD-10-CM | POA: Diagnosis not present

## 2023-04-22 DIAGNOSIS — N179 Acute kidney failure, unspecified: Secondary | ICD-10-CM | POA: Diagnosis not present

## 2023-04-22 DIAGNOSIS — J439 Emphysema, unspecified: Secondary | ICD-10-CM | POA: Diagnosis not present

## 2023-04-26 DIAGNOSIS — K529 Noninfective gastroenteritis and colitis, unspecified: Secondary | ICD-10-CM | POA: Diagnosis not present

## 2023-04-27 DIAGNOSIS — G4733 Obstructive sleep apnea (adult) (pediatric): Secondary | ICD-10-CM | POA: Diagnosis not present

## 2023-04-28 DIAGNOSIS — J439 Emphysema, unspecified: Secondary | ICD-10-CM | POA: Diagnosis not present

## 2023-04-28 DIAGNOSIS — E66811 Obesity, class 1: Secondary | ICD-10-CM | POA: Diagnosis not present

## 2023-04-28 DIAGNOSIS — L03116 Cellulitis of left lower limb: Secondary | ICD-10-CM | POA: Diagnosis not present

## 2023-04-28 DIAGNOSIS — N179 Acute kidney failure, unspecified: Secondary | ICD-10-CM | POA: Diagnosis not present

## 2023-04-28 DIAGNOSIS — J441 Chronic obstructive pulmonary disease with (acute) exacerbation: Secondary | ICD-10-CM | POA: Diagnosis not present

## 2023-04-28 DIAGNOSIS — J9611 Chronic respiratory failure with hypoxia: Secondary | ICD-10-CM | POA: Diagnosis not present

## 2023-04-28 DIAGNOSIS — M150 Primary generalized (osteo)arthritis: Secondary | ICD-10-CM | POA: Diagnosis not present

## 2023-04-28 DIAGNOSIS — I110 Hypertensive heart disease with heart failure: Secondary | ICD-10-CM | POA: Diagnosis not present

## 2023-04-28 DIAGNOSIS — S81802A Unspecified open wound, left lower leg, initial encounter: Secondary | ICD-10-CM | POA: Diagnosis not present

## 2023-04-29 DIAGNOSIS — I110 Hypertensive heart disease with heart failure: Secondary | ICD-10-CM | POA: Diagnosis not present

## 2023-04-29 DIAGNOSIS — J9611 Chronic respiratory failure with hypoxia: Secondary | ICD-10-CM | POA: Diagnosis not present

## 2023-04-29 DIAGNOSIS — M150 Primary generalized (osteo)arthritis: Secondary | ICD-10-CM | POA: Diagnosis not present

## 2023-04-29 DIAGNOSIS — N179 Acute kidney failure, unspecified: Secondary | ICD-10-CM | POA: Diagnosis not present

## 2023-04-29 DIAGNOSIS — L03116 Cellulitis of left lower limb: Secondary | ICD-10-CM | POA: Diagnosis not present

## 2023-04-29 DIAGNOSIS — S81802A Unspecified open wound, left lower leg, initial encounter: Secondary | ICD-10-CM | POA: Diagnosis not present

## 2023-04-29 DIAGNOSIS — J441 Chronic obstructive pulmonary disease with (acute) exacerbation: Secondary | ICD-10-CM | POA: Diagnosis not present

## 2023-04-29 DIAGNOSIS — E66811 Obesity, class 1: Secondary | ICD-10-CM | POA: Diagnosis not present

## 2023-04-29 DIAGNOSIS — J439 Emphysema, unspecified: Secondary | ICD-10-CM | POA: Diagnosis not present

## 2023-05-04 DIAGNOSIS — J449 Chronic obstructive pulmonary disease, unspecified: Secondary | ICD-10-CM | POA: Diagnosis not present

## 2023-05-04 DIAGNOSIS — E785 Hyperlipidemia, unspecified: Secondary | ICD-10-CM | POA: Diagnosis not present

## 2023-05-06 DIAGNOSIS — E785 Hyperlipidemia, unspecified: Secondary | ICD-10-CM | POA: Diagnosis not present

## 2023-05-12 DIAGNOSIS — Z6831 Body mass index (BMI) 31.0-31.9, adult: Secondary | ICD-10-CM | POA: Diagnosis not present

## 2023-05-12 DIAGNOSIS — I700 Atherosclerosis of aorta: Secondary | ICD-10-CM | POA: Diagnosis not present

## 2023-05-12 DIAGNOSIS — R300 Dysuria: Secondary | ICD-10-CM | POA: Diagnosis not present

## 2023-05-12 DIAGNOSIS — B356 Tinea cruris: Secondary | ICD-10-CM | POA: Diagnosis not present

## 2023-05-12 DIAGNOSIS — N952 Postmenopausal atrophic vaginitis: Secondary | ICD-10-CM | POA: Diagnosis not present

## 2023-05-17 DIAGNOSIS — M19049 Primary osteoarthritis, unspecified hand: Secondary | ICD-10-CM | POA: Diagnosis not present

## 2023-05-17 DIAGNOSIS — M169 Osteoarthritis of hip, unspecified: Secondary | ICD-10-CM | POA: Diagnosis not present

## 2023-05-17 DIAGNOSIS — K5900 Constipation, unspecified: Secondary | ICD-10-CM | POA: Diagnosis not present

## 2023-05-17 DIAGNOSIS — M479 Spondylosis, unspecified: Secondary | ICD-10-CM | POA: Diagnosis not present

## 2023-05-17 DIAGNOSIS — M179 Osteoarthritis of knee, unspecified: Secondary | ICD-10-CM | POA: Diagnosis not present

## 2023-05-17 DIAGNOSIS — M19019 Primary osteoarthritis, unspecified shoulder: Secondary | ICD-10-CM | POA: Diagnosis not present

## 2023-05-17 DIAGNOSIS — Z6830 Body mass index (BMI) 30.0-30.9, adult: Secondary | ICD-10-CM | POA: Diagnosis not present

## 2023-05-17 DIAGNOSIS — M2550 Pain in unspecified joint: Secondary | ICD-10-CM | POA: Diagnosis not present

## 2023-05-17 DIAGNOSIS — G894 Chronic pain syndrome: Secondary | ICD-10-CM | POA: Diagnosis not present

## 2023-05-17 DIAGNOSIS — J439 Emphysema, unspecified: Secondary | ICD-10-CM | POA: Diagnosis not present

## 2023-05-17 DIAGNOSIS — F319 Bipolar disorder, unspecified: Secondary | ICD-10-CM | POA: Diagnosis not present

## 2023-05-17 DIAGNOSIS — Z1389 Encounter for screening for other disorder: Secondary | ICD-10-CM | POA: Diagnosis not present

## 2023-05-17 DIAGNOSIS — J9610 Chronic respiratory failure, unspecified whether with hypoxia or hypercapnia: Secondary | ICD-10-CM | POA: Diagnosis not present

## 2023-05-20 DIAGNOSIS — M47896 Other spondylosis, lumbar region: Secondary | ICD-10-CM | POA: Diagnosis not present

## 2023-05-20 DIAGNOSIS — M47817 Spondylosis without myelopathy or radiculopathy, lumbosacral region: Secondary | ICD-10-CM | POA: Diagnosis not present

## 2023-05-29 ENCOUNTER — Other Ambulatory Visit: Payer: Self-pay | Admitting: Cardiology

## 2023-06-03 DIAGNOSIS — G894 Chronic pain syndrome: Secondary | ICD-10-CM | POA: Diagnosis not present

## 2023-06-03 DIAGNOSIS — M19049 Primary osteoarthritis, unspecified hand: Secondary | ICD-10-CM | POA: Diagnosis not present

## 2023-06-03 DIAGNOSIS — M479 Spondylosis, unspecified: Secondary | ICD-10-CM | POA: Diagnosis not present

## 2023-06-03 DIAGNOSIS — M179 Osteoarthritis of knee, unspecified: Secondary | ICD-10-CM | POA: Diagnosis not present

## 2023-06-03 DIAGNOSIS — J449 Chronic obstructive pulmonary disease, unspecified: Secondary | ICD-10-CM | POA: Diagnosis not present

## 2023-06-03 DIAGNOSIS — M19019 Primary osteoarthritis, unspecified shoulder: Secondary | ICD-10-CM | POA: Diagnosis not present

## 2023-06-03 DIAGNOSIS — E785 Hyperlipidemia, unspecified: Secondary | ICD-10-CM | POA: Diagnosis not present

## 2023-06-03 DIAGNOSIS — Z79899 Other long term (current) drug therapy: Secondary | ICD-10-CM | POA: Diagnosis not present

## 2023-06-03 DIAGNOSIS — Z1389 Encounter for screening for other disorder: Secondary | ICD-10-CM | POA: Diagnosis not present

## 2023-06-03 DIAGNOSIS — M2550 Pain in unspecified joint: Secondary | ICD-10-CM | POA: Diagnosis not present

## 2023-06-03 DIAGNOSIS — M169 Osteoarthritis of hip, unspecified: Secondary | ICD-10-CM | POA: Diagnosis not present

## 2023-06-03 DIAGNOSIS — F319 Bipolar disorder, unspecified: Secondary | ICD-10-CM | POA: Diagnosis not present

## 2023-06-07 ENCOUNTER — Observation Stay (HOSPITAL_COMMUNITY)
Admission: EM | Admit: 2023-06-07 | Discharge: 2023-06-08 | Disposition: A | Discharge status: Home / Self Care | Attending: Internal Medicine | Admitting: Internal Medicine

## 2023-06-07 ENCOUNTER — Encounter (HOSPITAL_COMMUNITY): Payer: Self-pay | Admitting: Emergency Medicine

## 2023-06-07 DIAGNOSIS — E871 Hypo-osmolality and hyponatremia: Secondary | ICD-10-CM

## 2023-06-07 DIAGNOSIS — I110 Hypertensive heart disease with heart failure: Secondary | ICD-10-CM | POA: Diagnosis not present

## 2023-06-07 DIAGNOSIS — L03115 Cellulitis of right lower limb: Secondary | ICD-10-CM | POA: Diagnosis not present

## 2023-06-07 DIAGNOSIS — I5032 Chronic diastolic (congestive) heart failure: Secondary | ICD-10-CM | POA: Diagnosis not present

## 2023-06-07 DIAGNOSIS — S81809A Unspecified open wound, unspecified lower leg, initial encounter: Secondary | ICD-10-CM

## 2023-06-07 DIAGNOSIS — I10 Essential (primary) hypertension: Secondary | ICD-10-CM | POA: Diagnosis present

## 2023-06-07 DIAGNOSIS — Z87891 Personal history of nicotine dependence: Secondary | ICD-10-CM | POA: Diagnosis not present

## 2023-06-07 DIAGNOSIS — Z7982 Long term (current) use of aspirin: Secondary | ICD-10-CM | POA: Diagnosis not present

## 2023-06-07 DIAGNOSIS — Z79899 Other long term (current) drug therapy: Secondary | ICD-10-CM | POA: Diagnosis not present

## 2023-06-07 DIAGNOSIS — F319 Bipolar disorder, unspecified: Secondary | ICD-10-CM | POA: Diagnosis not present

## 2023-06-07 DIAGNOSIS — J449 Chronic obstructive pulmonary disease, unspecified: Secondary | ICD-10-CM | POA: Diagnosis present

## 2023-06-07 DIAGNOSIS — G4733 Obstructive sleep apnea (adult) (pediatric): Secondary | ICD-10-CM | POA: Diagnosis present

## 2023-06-07 DIAGNOSIS — D649 Anemia, unspecified: Secondary | ICD-10-CM | POA: Diagnosis not present

## 2023-06-07 DIAGNOSIS — M79604 Pain in right leg: Secondary | ICD-10-CM | POA: Diagnosis present

## 2023-06-07 DIAGNOSIS — I5189 Other ill-defined heart diseases: Secondary | ICD-10-CM

## 2023-06-07 DIAGNOSIS — I25119 Atherosclerotic heart disease of native coronary artery with unspecified angina pectoris: Secondary | ICD-10-CM | POA: Diagnosis present

## 2023-06-07 DIAGNOSIS — I2510 Atherosclerotic heart disease of native coronary artery without angina pectoris: Secondary | ICD-10-CM | POA: Diagnosis present

## 2023-06-07 HISTORY — DX: Unspecified open wound, unspecified lower leg, initial encounter: S81.809A

## 2023-06-07 HISTORY — DX: Hypo-osmolality and hyponatremia: E87.1

## 2023-06-07 HISTORY — DX: Other ill-defined heart diseases: I51.89

## 2023-06-07 LAB — BASIC METABOLIC PANEL WITH GFR
Anion gap: 8 (ref 5–15)
BUN: 10 mg/dL (ref 8–23)
CO2: 21 mmol/L — ABNORMAL LOW (ref 22–32)
Calcium: 7.9 mg/dL — ABNORMAL LOW (ref 8.9–10.3)
Chloride: 95 mmol/L — ABNORMAL LOW (ref 98–111)
Creatinine, Ser: 0.81 mg/dL (ref 0.44–1.00)
GFR, Estimated: >60 mL/min (ref >60)
Glucose, Bld: 82 mg/dL (ref 70–99)
Potassium: 4 mmol/L (ref 3.5–5.1)
Sodium: 124 mmol/L — ABNORMAL LOW (ref 135–145)

## 2023-06-07 LAB — OSMOLALITY: Osmolality: 270 mosm/kg — ABNORMAL LOW (ref 275–295)

## 2023-06-07 LAB — COMPREHENSIVE METABOLIC PANEL WITH GFR
ALT: 21 U/L (ref 0–44)
AST: 22 U/L (ref 15–41)
Albumin: 2.8 g/dL — ABNORMAL LOW (ref 3.5–5.0)
Alkaline Phosphatase: 112 U/L (ref 38–126)
Anion gap: 9 (ref 5–15)
BUN: 10 mg/dL (ref 8–23)
CO2: 23 mmol/L (ref 22–32)
Calcium: 8.5 mg/dL — ABNORMAL LOW (ref 8.9–10.3)
Chloride: 91 mmol/L — ABNORMAL LOW (ref 98–111)
Creatinine, Ser: 0.88 mg/dL (ref 0.44–1.00)
GFR, Estimated: >60 mL/min (ref >60)
Glucose, Bld: 96 mg/dL (ref 70–99)
Potassium: 4.3 mmol/L (ref 3.5–5.1)
Sodium: 123 mmol/L — ABNORMAL LOW (ref 135–145)
Total Bilirubin: 0.4 mg/dL (ref 0.0–1.2)
Total Protein: 6 g/dL — ABNORMAL LOW (ref 6.5–8.1)

## 2023-06-07 LAB — CORTISOL: Cortisol, Plasma: 13.6 ug/dL

## 2023-06-07 LAB — CBC
HCT: 31.5 % — ABNORMAL LOW (ref 36.0–46.0)
Hemoglobin: 10.5 g/dL — ABNORMAL LOW (ref 12.0–15.0)
MCH: 29.6 pg (ref 26.0–34.0)
MCHC: 33.3 g/dL (ref 30.0–36.0)
MCV: 88.7 fL (ref 80.0–100.0)
Platelets: 366 10*3/uL (ref 150–400)
RBC: 3.55 MIL/uL — ABNORMAL LOW (ref 3.87–5.11)
RDW: 15.4 % (ref 11.5–15.5)
WBC: 7.3 10*3/uL (ref 4.0–10.5)
nRBC: 0 % (ref 0.0–0.2)

## 2023-06-07 LAB — OSMOLALITY, URINE: Osmolality, Ur: 76 mosm/kg — ABNORMAL LOW (ref 300–900)

## 2023-06-07 LAB — SODIUM, URINE, RANDOM: Sodium, Ur: <10 mmol/L

## 2023-06-07 MED ORDER — LOSARTAN POTASSIUM 50 MG PO TABS
25.0000 mg | ORAL_TABLET | Freq: Every day | ORAL | Status: DC
  Administered 2023-06-08: 25 mg via ORAL
  Filled 2023-06-07: qty 1

## 2023-06-07 MED ORDER — DOXYCYCLINE HYCLATE 100 MG PO TABS
100.0000 mg | ORAL_TABLET | Freq: Once | ORAL | Status: AC
  Administered 2023-06-07: 100 mg via ORAL
  Filled 2023-06-07: qty 1

## 2023-06-07 MED ORDER — BUDESON-GLYCOPYRROL-FORMOTEROL 160-9-4.8 MCG/ACT IN AERO
2.0000 | INHALATION_SPRAY | Freq: Two times a day (BID) | RESPIRATORY_TRACT | Status: DC
  Administered 2023-06-08: 2 via RESPIRATORY_TRACT
  Filled 2023-06-07: qty 5.9

## 2023-06-07 MED ORDER — NITROGLYCERIN 0.4 MG SL SUBL: 0.4000 mg | SUBLINGUAL_TABLET | SUBLINGUAL | Status: DC | PRN

## 2023-06-07 MED ORDER — ISOSORBIDE MONONITRATE ER 30 MG PO TB24
15.0000 mg | ORAL_TABLET | Freq: Every day | ORAL | Status: DC
Start: 2023-06-08 — End: 2023-06-08
  Administered 2023-06-08: 15 mg via ORAL
  Filled 2023-06-07: qty 1

## 2023-06-07 MED ORDER — GABAPENTIN 400 MG PO CAPS
400.0000 mg | ORAL_CAPSULE | Freq: Three times a day (TID) | ORAL | Status: DC
  Administered 2023-06-08 (×2): 400 mg via ORAL
  Filled 2023-06-07 (×2): qty 1

## 2023-06-07 MED ORDER — CITALOPRAM HYDROBROMIDE 20 MG PO TABS
40.0000 mg | ORAL_TABLET | Freq: Every day | ORAL | Status: DC
  Administered 2023-06-08: 40 mg via ORAL
  Filled 2023-06-07: qty 2

## 2023-06-07 MED ORDER — IPRATROPIUM-ALBUTEROL 0.5-2.5 (3) MG/3ML IN SOLN: 3.0000 mL | RESPIRATORY_TRACT | Status: DC | PRN

## 2023-06-07 MED ORDER — ASPIRIN 81 MG PO TBEC
81.0000 mg | DELAYED_RELEASE_TABLET | Freq: Every day | ORAL | Status: DC
  Administered 2023-06-08: 81 mg via ORAL
  Filled 2023-06-07: qty 1

## 2023-06-07 MED ORDER — ENOXAPARIN SODIUM 40 MG/0.4ML IJ SOSY
40.0000 mg | PREFILLED_SYRINGE | INTRAMUSCULAR | Status: DC
  Administered 2023-06-08: 40 mg via SUBCUTANEOUS
  Filled 2023-06-07: qty 0.4

## 2023-06-07 MED ORDER — ARIPIPRAZOLE 5 MG PO TABS
15.0000 mg | ORAL_TABLET | Freq: Every day | ORAL | Status: DC
  Administered 2023-06-08: 15 mg via ORAL
  Filled 2023-06-07: qty 1

## 2023-06-07 MED ORDER — HYDROCODONE-ACETAMINOPHEN 7.5-325 MG PO TABS
1.0000 | ORAL_TABLET | Freq: Three times a day (TID) | ORAL | Status: DC
  Administered 2023-06-08 (×2): 1 via ORAL
  Filled 2023-06-07 (×2): qty 1

## 2023-06-07 MED ORDER — QUETIAPINE FUMARATE 100 MG PO TABS
300.0000 mg | ORAL_TABLET | Freq: Every day | ORAL | Status: DC
  Administered 2023-06-08: 300 mg via ORAL
  Filled 2023-06-07: qty 3

## 2023-06-07 MED ORDER — METHOCARBAMOL 500 MG PO TABS
500.0000 mg | ORAL_TABLET | Freq: Two times a day (BID) | ORAL | Status: DC
  Administered 2023-06-08 (×2): 500 mg via ORAL
  Filled 2023-06-07 (×2): qty 1

## 2023-06-07 MED ORDER — TRAZODONE HCL 50 MG PO TABS
100.0000 mg | ORAL_TABLET | Freq: Every day | ORAL | Status: DC
  Administered 2023-06-08: 100 mg via ORAL
  Filled 2023-06-07: qty 2

## 2023-06-07 MED ORDER — ATORVASTATIN CALCIUM 40 MG PO TABS
40.0000 mg | ORAL_TABLET | Freq: Every day | ORAL | Status: DC
  Administered 2023-06-08: 40 mg via ORAL
  Filled 2023-06-07: qty 1

## 2023-06-07 MED ORDER — SODIUM CHLORIDE 0.9 % IV BOLUS
1000.0000 mL | Freq: Once | INTRAVENOUS | Status: AC
  Administered 2023-06-07: 1000 mL via INTRAVENOUS

## 2023-06-07 NOTE — ED Notes (Signed)
 Pharmacy talking to pt delay in going to floor

## 2023-06-07 NOTE — ED Provider Notes (Signed)
 [place] EMERGENCY DEPARTMENT AT [facility] Provider Note   CSN: [id number] Arrival date & time: 06/07/23  1452     History Chief Complaint  Patient presents with   Leg Pain    Tracy Oconnor is a 63 y.o. female.  Patient presents to the emergency department today with concerns of leg pain.  Past history significant for COPD, atherosclerosis, sleep apnea, obesity.  She reports that she has had ulcer type source of the right lower leg.  States that about 2 or 3 weeks ago, she had scratch above these areas with small wounds that have not healed properly.  Reports some clear drainage and swelling over the anterior portion of the right lower leg.  Denies any recent fever, chills or bodyaches.   Leg Pain      Home Medications Prior to Admission medications   Medication Sig Start Date End Date Taking? Authorizing Provider  albuterol  (VENTOLIN  HFA) 108 (90 Base) MCG/ACT inhaler Inhale 2 puffs into the lungs every 4 (four) hours as needed for shortness of breath or wheezing.    [provider]  ARIPiprazole (ABILIFY) 15 MG tablet Take 15 mg by mouth daily. 02/25/18   [provider]  aspirin  EC 81 MG tablet Take 1 tablet (81 mg total) by mouth daily. Swallow whole. 07/29/22   Revankar, Micael Adas, MD  atorvastatin  (LIPITOR) 40 MG tablet Take 1 tablet (40 mg total) by mouth at bedtime. 04/21/23 07/20/23  Terrance Ferretti, NP  Cholecalciferol (VITAMIN D-3) 125 MCG (5000 UT) TABS Take 5,000 Units by mouth daily.    [provider]  citalopram (CELEXA) 40 MG tablet Take 40 mg by mouth daily. 01/13/18   [provider]  diclofenac (VOLTAREN) 75 MG EC tablet Take 75 mg by mouth 2 (two) times daily. 10/11/20   [provider]  empagliflozin  (JARDIANCE ) 10 MG TABS tablet Take 1 tablet (10 mg total) by mouth daily before breakfast. 03/08/23   Terrance Ferretti, NP  fluticasone (FLONASE) 50 MCG/ACT nasal spray Place 1 spray into both nostrils daily.  01/17/18   [provider]  gabapentin (NEURONTIN) 400 MG capsule Take 400 mg by mouth 3 (three) times daily. 10/14/20   [provider]  HYDROcodone-acetaminophen  (NORCO/VICODIN) 5-325 MG tablet Take 1 tablet by mouth 3 (three) times daily. 02/08/23   [provider]  isosorbide  mononitrate (IMDUR ) 30 MG 24 hr tablet Take 0.5 tablets (15 mg total) by mouth daily. 08/17/22   Terrance Ferretti, NP  lidocaine  (LIDODERM ) 5 % Place 1 patch onto the skin daily. 08/27/21   [provider]  losartan (COZAAR) 25 MG tablet Take 25 mg by mouth daily. 09/09/21   [provider]  methocarbamol (ROBAXIN) 500 MG tablet Take 500 mg by mouth 2 (two) times daily. 10/21/22   [provider]  Multiple Vitamin (MULTIVITAMIN ADULT PO) Take 1 tablet by mouth daily.    [provider]  naloxone [facility]) nasal spray 4 mg/0.1 mL Place 1 spray into the nose once. 01/06/23   [provider]  nitroGLYCERIN  (NITROSTAT ) 0.4 MG SL tablet Place 1 tablet (0.4 mg total) under the tongue every 5 (five) minutes as needed for chest pain. 02/18/23 05/19/23  Terrance Ferretti, NP  OYSTER SHELL CALCIUM  PO Take 1 tablet by mouth daily.    [provider]  QUEtiapine (SEROQUEL) 300 MG tablet Take 300 mg by mouth at bedtime. 10/30/20   [provider]  torsemide  (DEMADEX ) 20 MG tablet  Take 3 tablets (60 mg total) by mouth as directed. Take 40 mg ( 2 tabs) in am and 20 mg ( 1 tab) in afternoon. 04/05/23   Terrance Ferretti, NP  traZODone (DESYREL) 50 MG tablet Take 100 mg by mouth at bedtime. 09/06/21   [provider]  Gaylan Kaufman 200-62.5-25 MCG/ACT AEPB Inhale 1 puff into the lungs daily. 02/04/22   [provider]  torsemide  (DEMADEX ) 10 MG tablet Take 1 tablet (10 mg total) by mouth as needed (pedal edema). 09/11/22   Terrance Ferretti, NP      Allergies    Penicillins    Review of Systems   Review of Systems  Skin:  Positive for wound.   All other systems reviewed and are negative.   Physical Exam Updated Vital Signs BP 105/80   Pulse 66   Temp 98.4 F (36.9 C)   Resp (!) 22   SpO2 94%  Physical Exam Vitals and nursing note reviewed.  Constitutional:      General: She is not in acute distress.    Appearance: She is well-developed.  HENT:     Head: Normocephalic and atraumatic.  Eyes:     Conjunctiva/sclera: Conjunctivae normal.  Cardiovascular:     Rate and Rhythm: Normal rate and regular rhythm.     Heart sounds: No murmur heard. Pulmonary:     Effort: Pulmonary effort is normal. No respiratory distress.     Breath sounds: Normal breath sounds.  Abdominal:     Palpations: Abdomen is soft.     Tenderness: There is no abdominal tenderness.  Musculoskeletal:        General: No swelling.     Cervical back: Neck supple.     Right lower leg: Edema present.     Left lower leg: No edema.  Skin:    General: Skin is warm and dry.     Capillary Refill: Capillary refill takes less than 2 seconds.     Comments: Multiple skin ulcerations present to the right lower extremity with pitting edema seen.  There is some erythema and induration surrounding these ulcerations but no obvious purulent drainage is seen.  There is some clear weeping present from large portions of her leg.  Neurological:     Mental Status: She is alert.  Psychiatric:        Mood and Affect: Mood normal.     ED Results / Procedures / Treatments   Labs (all labs ordered are listed, but only abnormal results are displayed) Labs Reviewed  CBC - Abnormal; Notable for the following components:      Result Value   RBC 3.55 (*)    Hemoglobin 10.5 (*)    HCT 31.5 (*)    All other components within normal limits  COMPREHENSIVE METABOLIC PANEL WITH GFR - Abnormal; Notable for the following components:   Sodium 123 (*)    Chloride 91 (*)    Calcium  8.5 (*)    Total Protein 6.0 (*)    Albumin 2.8 (*)    All other components within normal limits   BASIC METABOLIC PANEL WITH GFR - Abnormal; Notable for the following components:   Sodium 124 (*)    Chloride 95 (*)    CO2 21 (*)    Calcium  7.9 (*)    All other components within normal limits  SODIUM, URINE, RANDOM  OSMOLALITY, URINE  TSH  CORTISOL  OSMOLALITY    EKG None  Radiology No results found.  Procedures  Procedures   Medications Ordered in ED Medications  sodium chloride  0.9 % bolus 1,000 mL (0 mLs Intravenous Stopped 06/07/23 1908)  doxycycline (VIBRA-TABS) tablet 100 mg (100 mg Oral Given 06/07/23 1729)    ED Course/ Medical Decision Making/ A&P                                 Medical Decision Making Amount and/or Complexity of Data Reviewed Labs: ordered.  Risk Prescription drug management.   This patient presents to the ED for concern of leg pain.  Differential diagnosis includes cellulitis, abscess, ulcerations, peripheral artery disease.   Lab Tests:  I Ordered, and personally interpreted labs.  The pertinent results include: CBC with notable anemia, CMP shows hyponatremia 123 and chloride at 91.  Protein albumin level slightly low but no evidence signs of renal dysfunction with GFR greater than 60 and creatinine unremarkable at 0.88.    Medicines ordered and prescription drug management:  I ordered medication including doxycycline, NS fluid bolus for cellulitis, dehydration Reevaluation of the patient after these medicines showed that the patient minimal improvement I have reviewed the patients home medicines and have made adjustments as needed   Problem List / ED Course:  Patient with past history significant for COPD, atherosclerosis, obesity and sleep apnea presents emergency department today with concerns of leg ulcers.  Reports the right lower leg has had multiple ulcers develop after initial injuries about 2 or 3 weeks back.  Endorses a watery drainage coming from the ulcer site.  Denies any recent fever chills or bodyaches.  Denies  purulent drainage from the infected sites. Physical exam reveals multiple shallow ulcerations to the anterior surface of the right lower extremity.  There is some surrounding erythema and edema with some induration present.  Concern for possible cellulitis.  Patient's labs are thankfully reassuring with no obvious signs of leukocytosis and patient vitals unremarkable.  Patient of note does have hyponatremia at 123 and will initiate fluid resuscitation.  Will start on a course of doxycycline for suspected infection as there is no evidence of purulent drainage. After 1 L NS fluid bolus, patient's sodium barely improved to 124.  Although patient is still largely symptomatic, did not advise need for possible admission given significant hyponatremia.  I believe this is likely secondary to third spacing as patient has weeping present to the right lower extremity ulcer/wound.  Will consult inpatient medicine for admission. Spoke with Dr. [facility] Lavender, hospitalist, who will be admitting patient.  Final Clinical Impression(s) / ED Diagnoses Final diagnoses:  Hyponatremia  Cellulitis of right lower extremity    Rx / DC Orders ED Discharge Orders     None         Daren Doswell A, PA-C 06/07/23 2126    Tegeler, Marine Sia, MD 06/08/23 [phone]

## 2023-06-07 NOTE — ED Notes (Signed)
 Xeroform applied to wounds on right lower leg and wrapped. Tolerated without any issues.

## 2023-06-07 NOTE — ED Triage Notes (Signed)
 Pt here from home with c/o ulcer type sores to the right lower leg

## 2023-06-07 NOTE — H&P (Incomplete)
 History and Physical    Tracy Oconnor ZOX:[id number] DOB: 03-08-1960 DOA: 06/07/2023  Patient coming from: Home.  Chief Complaint: Lower extremity wounds.  HPI: Tracy Oconnor is a 63 y.o. female with history of chronic HFpEF, CAD, COPD, OSA, bipolar disorder presents to the ER with increasing worsening lower extremity wounds.  Patient states over the last 4 weeks patient has noticed worsening wounds of the both lower extremities initially has blisters and forming ulceration.  Denies any obvious discharge.  Denies otherwise any chest pain or shortness of breath.  Patient states she has been drinking multiple cans of sodas every day.  ED Course: In the ER patient was afebrile.  Labs show sodium of 123 with hemoglobin of 10.5.  Patient admitted for further observation for acute hyponatremia.  Patient was given 1 L fluid bolus in the ER.  Review of Systems: As per HPI, rest all negative.   Past Medical History:  Diagnosis Date   Aortic atherosclerosis (HCC) 07/29/2022   Atherosclerosis    Bilateral carpal tunnel syndrome 01/30/2021   Bipolar disorder (HCC)    Cervical radiculopathy 01/30/2021   COPD (chronic obstructive pulmonary disease) (HCC)    Coronary artery disease 07/29/2022   DOE (dyspnea on exertion) 07/29/2022   Edema    Elevated lipoprotein A level    2024   Enteritis 12/17/2020   Fatigue    Hyperlipidemia    Hypertension    Intermittent chest pain    Mitral regurgitation    Murmur    Obesity (BMI 30.0-34.9) 07/29/2022   OSA (obstructive sleep apnea)    Palpitations     Past Surgical History:  Procedure Laterality Date   CHOLECYSTECTOMY     CORONARY PRESSURE/FFR STUDY N/A 08/13/2022   Procedure: CORONARY PRESSURE/FFR STUDY;  Surgeon: Sammy Crisp, MD;  Location: [facility] INVASIVE CV LAB;  Service: Cardiovascular;  Laterality: N/A;   LEFT HEART CATH AND CORONARY ANGIOGRAPHY N/A 08/13/2022   Procedure: LEFT HEART CATH AND CORONARY ANGIOGRAPHY;  Surgeon: Sammy Crisp, MD;  Location: [facility] INVASIVE CV LAB;  Service: Cardiovascular;  Laterality: N/A;   TONSILLECTOMY     TUBAL LIGATION       reports that she has quit smoking. Her smoking use included cigarettes. She has never used smokeless tobacco. No history on file for alcohol  use and drug use.  Allergies  Allergen Reactions   Penicillins     Childhood allergy not sure what happens      Jardiance  [Empagliflozin ] Other (See Comments)    Yeast infection    Family History  Problem Relation Age of Onset   Heart disease Mother    Cancer Father    Breast cancer Maternal Aunt     Prior to Admission medications   Medication Sig Start Date End Date Taking? Authorizing Provider  albuterol  (VENTOLIN  HFA) 108 (90 Base) MCG/ACT inhaler Inhale 2 puffs into the lungs every 4 (four) hours as needed for shortness of breath or wheezing.   Yes [provider]  ARIPiprazole (ABILIFY) 15 MG tablet Take 15 mg by mouth daily. 02/25/18  Yes [provider]  aspirin  EC 81 MG tablet Take 1 tablet (81 mg total) by mouth daily. Swallow whole. 07/29/22  Yes Revankar, Micael Adas, MD  atorvastatin  (LIPITOR) 40 MG tablet Take 1 tablet (40 mg total) by mouth at bedtime. 04/21/23 07/20/23 Yes Terrance Ferretti, NP  Calcium  Carb-Cholecalciferol (OYSTER SHELL CALCIUM  250+D) 250-3.125 MG-MCG TABS Take 1 tablet by mouth daily.   Yes [provider]  Cholecalciferol (VITAMIN D-3) 125 MCG (5000 UT) TABS Take 5,000 Units by mouth daily.   Yes [provider]  citalopram (CELEXA) 40 MG tablet Take 40 mg by mouth daily. 01/13/18  Yes [provider]  clotrimazole (LOTRIMIN) 1 % cream Apply topically 2 (two) times daily. 05/12/23  Yes [provider]  diclofenac (VOLTAREN) 75 MG EC tablet Take 75 mg by mouth 2 (two) times daily. 10/11/20  Yes [provider]  fluticasone (FLONASE) 50 MCG/ACT nasal spray Place 2 sprays into both nostrils 2 (two) times daily. 01/17/18  Yes  [provider]  gabapentin (NEURONTIN) 400 MG capsule Take 400 mg by mouth 3 (three) times daily. 10/14/20  Yes [provider]  HYDROcodone-acetaminophen  (NORCO) 7.5-325 MG tablet Take 1 tablet by mouth 3 (three) times daily. 05/17/23  Yes [provider]  ipratropium-albuterol  (DUONEB) 0.5-2.5 (3) MG/3ML SOLN SMARTSIG:1 Vial(s) Every 6 Hours 01/29/23  Yes [provider]  isosorbide  mononitrate (IMDUR ) 30 MG 24 hr tablet Take 0.5 tablets (15 mg total) by mouth daily. 08/17/22  Yes Terrance Ferretti, NP  lidocaine  (LIDODERM ) 5 % Place 1 patch onto the skin daily. 08/27/21  Yes [provider]  losartan (COZAAR) 25 MG tablet Take 25 mg by mouth daily. 09/09/21  Yes [provider]  methocarbamol (ROBAXIN) 500 MG tablet Take 500 mg by mouth 2 (two) times daily. 10/21/22  Yes [provider]  Multiple Vitamin (MULTIVITAMIN ADULT PO) Take 1 tablet by mouth daily.   Yes [provider]  naloxone (NARCAN) nasal spray 4 mg/0.1 mL Place 1 spray into the nose once. 01/06/23  Yes [provider]  nitroGLYCERIN  (NITROSTAT ) 0.4 MG SL tablet Place 1 tablet (0.4 mg total) under the tongue every 5 (five) minutes as needed for chest pain. 02/18/23 12/03/23 Yes Terrance Ferretti, NP  ondansetron  (ZOFRAN -ODT) 4 MG disintegrating tablet Take 4 mg by mouth every 6 (six) hours as needed. 04/20/23  Yes [provider]  PREMARIN vaginal cream Place 0.5 g vaginally 2 (two) times a week. Apply Wed and Sun 05/12/23  Yes [provider]  QUEtiapine (SEROQUEL) 300 MG tablet Take 300 mg by mouth at bedtime. 10/30/20  Yes [provider]  torsemide  (DEMADEX ) 20 MG tablet Take 3 tablets (60 mg total) by mouth as directed. Take 40 mg ( 2 tabs) in am and 20 mg ( 1 tab) in afternoon. 04/05/23  Yes Terrance Ferretti, NP  traZODone (DESYREL) 50 MG tablet Take 100 mg by mouth at bedtime. 09/06/21  Yes [provider]  Gaylan Kaufman  200-62.5-25 MCG/ACT AEPB Inhale 1 puff into the lungs daily. 02/04/22  Yes [provider]  torsemide  (DEMADEX ) 10 MG tablet Take 1 tablet (10 mg total) by mouth as needed (pedal edema). 09/11/22   Terrance Ferretti, NP    Physical Exam: Constitutional: Moderately built and nourished. Vitals:   06/07/23 1516 06/07/23 1800 06/07/23 2200 06/07/23 2215  BP: 96/75 105/80 110/78 (!) 149/88  Pulse: 71 66 77 75  Resp: (!) 24 (!) 22 (!) 22 (!) 21  Temp: 98.2 F (36.8 C) 98.4 F (36.9 C) 98.6 F (37 C) 98 F (36.7 C)  TempSrc:    Oral  SpO2: 93% 94% 95% 100%   Eyes: Anicteric no pallor. ENMT: No discharge from the ears eyes nose and mouth. Neck: No mass felt.  No neck rigidity. Respiratory: No rhonchi or crepitations. Cardiovascular: S1-S2 heard. Abdomen: Soft nontender bowel sound present. Musculoskeletal: No edema. Skin:  Multiple ulceration of the lower extremities.  There is a large 3 cm ulcer on the posterior aspect of right calf. Neurologic: Alert awake oriented time place and person.  Moves all extremities. Psychiatric: Appears normal.  Normal affect.   Labs on Admission: I have personally reviewed following labs and imaging studies  CBC: Recent Labs  Lab 06/07/23 1520  WBC 7.3  HGB 10.5*  HCT 31.5*  MCV 88.7  PLT 366   Basic Metabolic Panel: Recent Labs  Lab 06/07/23 1520 06/07/23 1905  NA 123* 124*  K 4.3 4.0  CL 91* 95*  CO2 23 21*  GLUCOSE 96 82  BUN 10 10  CREATININE 0.88 0.81  CALCIUM  8.5* 7.9*   GFR: CrCl cannot be calculated (Unknown ideal weight.). Liver Function Tests: Recent Labs  Lab 06/07/23 1520  AST 22  ALT 21  ALKPHOS 112  BILITOT 0.4  PROT 6.0*  ALBUMIN 2.8*   No results for input(s): "LIPASE", "AMYLASE" in the last 168 hours. No results for input(s): "AMMONIA" in the last 168 hours. Coagulation Profile: No results for input(s): "INR", "PROTIME" in the last 168 hours. Cardiac Enzymes: No results for input(s): "CKTOTAL",  "CKMB", "CKMBINDEX", "TROPONINI" in the last 168 hours. BNP (last 3 results) Recent Labs    02/18/23 1044  PROBNP 548*   HbA1C: No results for input(s): "HGBA1C" in the last 72 hours. CBG: No results for input(s): "GLUCAP" in the last 168 hours. Lipid Profile: No results for input(s): "CHOL", "HDL", "LDLCALC", "TRIG", "CHOLHDL", "LDLDIRECT" in the last 72 hours. Thyroid  Function Tests: No results for input(s): "TSH", "T4TOTAL", "FREET4", "T3FREE", "THYROIDAB" in the last 72 hours. Anemia Panel: No results for input(s): "VITAMINB12", "FOLATE", "FERRITIN", "TIBC", "IRON", "RETICCTPCT" in the last 72 hours. Urine analysis: No results found for: "COLORURINE", "APPEARANCEUR", "LABSPEC", "PHURINE", "GLUCOSEU", "HGBUR", "BILIRUBINUR", "KETONESUR", "PROTEINUR", "UROBILINOGEN", "NITRITE", "LEUKOCYTESUR" Sepsis Labs: @LABRCNTIP (procalcitonin:4,lacticidven:4) )No results found for this or any previous visit (from the past 240 hours).   Radiological Exams on Admission: No results found.    Assessment/Plan Principal Problem:   Hyponatremia Active Problems:   COPD (chronic obstructive pulmonary disease) (HCC)   OSA (obstructive sleep apnea)   Bipolar disorder (HCC)   Hypertension   Coronary artery disease   Multiple open wounds of lower extremity   Diastolic dysfunction    Hyponatremia -     will check urine studies including urine sodium osmolality and serum osmolality TSH cortisol levels.  Patient states she drinks a lot of sodas.  Will try to fluid restrict at this time until we get the studies ordered.  Patient did receive fluid bolus in the ER.  Follow metabolic panel closely. Multiple wounds of the lower extremities will consult wound team.  Does not look infected. Chronic HFrEF on losartan and torsemide .  Will hold torsemide  for now and closely observe patient's fluid status. CAD with single-vessel disease CTO being managed medically with Imdur  antiplatelet agents. Anemia follow  CBC.  Will check anemia panel. Bipolar disorder on Abilify Celexa trazodone and quetiapine. COPD not actively wheezing. Sleep apnea on CPAP at bedtime.  Since patient has acute hyponatremia will need close monitoring and further observation and more than 2 midnight stay.   DVT prophylaxis: Lovenox. Code Status: Full code. Family Communication: Discussed with patient. Disposition Plan: Medical floor. Consults called: Wound team. Admission status: Observation.

## 2023-06-07 NOTE — ED Notes (Signed)
 Pt reports wounds that are draining on her right lower leg. They've been there for a few months but reports today the pain just became too much. Drainage noted to a wound on the front and back of her leg along with some swelling and redness. Denies fevers.   Denies further needs right now.

## 2023-06-07 NOTE — ED Notes (Signed)
Pt in the restroom.

## 2023-06-08 ENCOUNTER — Other Ambulatory Visit (HOSPITAL_COMMUNITY): Payer: Self-pay

## 2023-06-08 ENCOUNTER — Other Ambulatory Visit: Payer: Self-pay

## 2023-06-08 ENCOUNTER — Observation Stay (HOSPITAL_COMMUNITY)

## 2023-06-08 DIAGNOSIS — M19071 Primary osteoarthritis, right ankle and foot: Secondary | ICD-10-CM | POA: Diagnosis not present

## 2023-06-08 DIAGNOSIS — R600 Localized edema: Secondary | ICD-10-CM | POA: Diagnosis not present

## 2023-06-08 DIAGNOSIS — M25571 Pain in right ankle and joints of right foot: Secondary | ICD-10-CM | POA: Diagnosis not present

## 2023-06-08 DIAGNOSIS — E871 Hypo-osmolality and hyponatremia: Secondary | ICD-10-CM | POA: Diagnosis not present

## 2023-06-08 LAB — BASIC METABOLIC PANEL WITH GFR
Anion gap: 7 (ref 5–15)
Anion gap: 8 (ref 5–15)
Anion gap: 8 (ref 5–15)
BUN: 7 mg/dL — ABNORMAL LOW (ref 8–23)
BUN: 7 mg/dL — ABNORMAL LOW (ref 8–23)
BUN: 5 mg/dL — ABNORMAL LOW (ref 8–23)
CO2: 22 mmol/L (ref 22–32)
CO2: 22 mmol/L (ref 22–32)
CO2: 21 mmol/L — ABNORMAL LOW (ref 22–32)
Calcium: 8.7 mg/dL — ABNORMAL LOW (ref 8.9–10.3)
Calcium: 8.6 mg/dL — ABNORMAL LOW (ref 8.9–10.3)
Calcium: 8.5 mg/dL — ABNORMAL LOW (ref 8.9–10.3)
Chloride: 100 mmol/L (ref 98–111)
Chloride: 103 mmol/L (ref 98–111)
Chloride: 103 mmol/L (ref 98–111)
Creatinine, Ser: 0.73 mg/dL (ref 0.44–1.00)
Creatinine, Ser: 0.68 mg/dL (ref 0.44–1.00)
Creatinine, Ser: 0.67 mg/dL (ref 0.44–1.00)
GFR, Estimated: >60 mL/min (ref >60)
GFR, Estimated: >60 mL/min (ref >60)
GFR, Estimated: >60 mL/min (ref >60)
Glucose, Bld: 95 mg/dL (ref 70–99)
Glucose, Bld: 90 mg/dL (ref 70–99)
Glucose, Bld: 98 mg/dL (ref 70–99)
Potassium: 4 mmol/L (ref 3.5–5.1)
Potassium: 3.9 mmol/L (ref 3.5–5.1)
Potassium: 4 mmol/L (ref 3.5–5.1)
Sodium: 129 mmol/L — ABNORMAL LOW (ref 135–145)
Sodium: 133 mmol/L — ABNORMAL LOW (ref 135–145)
Sodium: 132 mmol/L — ABNORMAL LOW (ref 135–145)

## 2023-06-08 LAB — RETICULOCYTES
Immature Retic Fract: 26.1 % — ABNORMAL HIGH (ref 2.3–15.9)
RBC.: 3.61 MIL/uL — ABNORMAL LOW (ref 3.87–5.11)
Retic Count, Absolute: 57.8 10*3/uL (ref 19.0–186.0)
Retic Ct Pct: 1.6 % (ref 0.4–3.1)

## 2023-06-08 LAB — VITAMIN B12: Vitamin B-12: 265 pg/mL (ref 180–914)

## 2023-06-08 LAB — HIV ANTIBODY (ROUTINE TESTING W REFLEX): HIV Screen 4th Generation wRfx: NONREACTIVE

## 2023-06-08 LAB — IRON AND TIBC
Iron: 27 ug/dL — ABNORMAL LOW (ref 28–170)
Saturation Ratios: 11 % (ref 10.4–31.8)
TIBC: 242 ug/dL — ABNORMAL LOW (ref 250–450)
UIBC: 215 ug/dL

## 2023-06-08 LAB — CBC
HCT: 30.7 % — ABNORMAL LOW (ref 36.0–46.0)
Hemoglobin: 10.5 g/dL — ABNORMAL LOW (ref 12.0–15.0)
MCH: 29.9 pg (ref 26.0–34.0)
MCHC: 34.2 g/dL (ref 30.0–36.0)
MCV: 87.5 fL (ref 80.0–100.0)
Platelets: 371 10*3/uL (ref 150–400)
RBC: 3.51 MIL/uL — ABNORMAL LOW (ref 3.87–5.11)
RDW: 15.7 % — ABNORMAL HIGH (ref 11.5–15.5)
WBC: 8.4 10*3/uL (ref 4.0–10.5)
nRBC: 0 % (ref 0.0–0.2)

## 2023-06-08 LAB — TSH: TSH: 3.933 u[IU]/mL (ref 0.350–4.500)

## 2023-06-08 LAB — FOLATE: Folate: 30.2 ng/mL (ref >5.9)

## 2023-06-08 LAB — FERRITIN: Ferritin: 79 ng/mL (ref 11–307)

## 2023-06-08 MED ORDER — MEDIHONEY WOUND/BURN DRESSING EX PSTE
1.0000 | PASTE | Freq: Every day | CUTANEOUS | Status: DC
  Administered 2023-06-08: 1 via TOPICAL
  Filled 2023-06-08: qty 44

## 2023-06-08 MED ORDER — DOXYCYCLINE HYCLATE 50 MG PO CAPS
50.0000 mg | ORAL_CAPSULE | Freq: Two times a day (BID) | ORAL | 0 refills | Status: AC
  Filled 2023-06-08: qty 20, 10d supply, fill #0

## 2023-06-08 NOTE — Progress Notes (Signed)
 OT Cancellation Note  Patient Details Name: Tracy Oconnor MRN: [id number] DOB: [birth year]   Cancelled Treatment:    Reason Eval/Treat Not Completed: Patient not medically ready Awaiting imaging and results from x-ray. Will return as able to.   Cydne Grahn C, OT  Acute Rehabilitation Services Office [phone] Secure chat preferred   Mickael Alamo 06/08/2023, 12:49 PM

## 2023-06-08 NOTE — Discharge Summary (Signed)
 Physician Discharge Summary  Tracy Oconnor UJW:[id number] DOB: Jul 02, 1960 DOA: 06/07/2023  PCP: Lonie Roa, MD  Admit date: 06/07/2023 Discharge date: 06/08/2023  Admitted From: Home  Disposition: Home  Recommendations for Outpatient Follow-up:  Follow up with PCP in 1-2 weeks  Home Health:None  Equipment/Devices:None  Discharge Condition: Stable CODE STATUS: Full Diet recommendation: Low-salt low-fat low-carb diet  Brief/Interim Summary: Tracy Oconnor is a 63 y.o. female with history of chronic HFpEF, CAD, COPD, OSA, bipolar disorder presents to the ER with increasing worsening lower extremity wounds.  Patient presents to the hospital with worsening lower extremity wounds, she does not appear to be actively on antibiotics nor has she been on antibiotics recently.  She notes she was treated for UTI a few weeks ago, possibly a few months ago.  At this time she is complaining of right medial ankle pain, plain film unremarkable for acute fractures.  Patient's hypovolemic hyponatremia resolved rapidly with fluids.  She remained asymptomatic during her hospitalization.  PT OT following, otherwise recommending discharge home with p.o. antibiotics for presumed cellulitis with outpatient follow-up in the next 1 to 2 weeks as she does not meet sepsis criteria and infection appears to be localized to well-controlled.  Continue wound care as discussed at bedside, keep wounds clean dry and exchanged bandages if soiled.  Would recommend outpatient wound care follow-up if multiple excoriations and cellulitis does not appear to be improving after antibiotic course.  Discharge Diagnoses:  Principal Problem:   Hyponatremia Active Problems:   COPD (chronic obstructive pulmonary disease) (HCC)   OSA (obstructive sleep apnea)   Bipolar disorder (HCC)   Hypertension   Coronary artery disease   Multiple open wounds of lower extremity   Diastolic dysfunction    Discharge Instructions   Allergies  as of 06/08/2023       Reactions   Penicillins    Childhood allergy not sure what happens    Jardiance  [empagliflozin ] Other (See Comments)   Yeast infection        Medication List     STOP taking these medications    HYDROcodone-acetaminophen  7.5-325 MG tablet Commonly known as: NORCO       TAKE these medications    albuterol  108 (90 Base) MCG/ACT inhaler Commonly known as: VENTOLIN  HFA Inhale 2 puffs into the lungs every 4 (four) hours as needed for shortness of breath or wheezing.   ARIPiprazole 15 MG tablet Commonly known as: ABILIFY Take 15 mg by mouth daily.   aspirin  EC 81 MG tablet Take 1 tablet (81 mg total) by mouth daily. Swallow whole.   atorvastatin  40 MG tablet Commonly known as: LIPITOR Take 1 tablet (40 mg total) by mouth at bedtime.   citalopram 40 MG tablet Commonly known as: CELEXA Take 40 mg by mouth daily.   clotrimazole 1 % cream Commonly known as: LOTRIMIN Apply topically 2 (two) times daily.   diclofenac 75 MG EC tablet Commonly known as: VOLTAREN Take 75 mg by mouth 2 (two) times daily.   doxycycline 50 MG capsule Commonly known as: VIBRAMYCIN Take 1 capsule (50 mg total) by mouth 2 (two) times daily for 10 days.   fluticasone 50 MCG/ACT nasal spray Commonly known as: FLONASE Place 2 sprays into both nostrils 2 (two) times daily.   gabapentin 400 MG capsule Commonly known as: NEURONTIN Take 400 mg by mouth 3 (three) times daily.   ipratropium-albuterol  0.5-2.5 (3) MG/3ML Soln Commonly known as: DUONEB SMARTSIG:1 Vial(s) Every 6 Hours   isosorbide  mononitrate  30 MG 24 hr tablet Commonly known as: IMDUR  Take 0.5 tablets (15 mg total) by mouth daily.   lidocaine  5 % Commonly known as: LIDODERM  Place 1 patch onto the skin daily.   losartan 25 MG tablet Commonly known as: COZAAR Take 25 mg by mouth daily.   methocarbamol 500 MG tablet Commonly known as: ROBAXIN Take 500 mg by mouth 2 (two) times daily.   MULTIVITAMIN  ADULT PO Take 1 tablet by mouth daily.   naloxone 4 MG/0.1ML Liqd nasal spray kit Commonly known as: NARCAN Place 1 spray into the nose once.   nitroGLYCERIN  0.4 MG SL tablet Commonly known as: NITROSTAT  Place 1 tablet (0.4 mg total) under the tongue every 5 (five) minutes as needed for chest pain.   ondansetron  4 MG disintegrating tablet Commonly known as: ZOFRAN -ODT Take 4 mg by mouth every 6 (six) hours as needed.   Oyster Shell Calcium  250+D 250-3.125 MG-MCG Tabs Generic drug: Calcium  Carb-Cholecalciferol Take 1 tablet by mouth daily.   Premarin vaginal cream Generic drug: conjugated estrogens Place 0.5 g vaginally 2 (two) times a week. Apply Wed and Sun   QUEtiapine 300 MG tablet Commonly known as: SEROQUEL Take 300 mg by mouth at bedtime.   torsemide  20 MG tablet Commonly known as: DEMADEX  Take 3 tablets (60 mg total) by mouth as directed. Take 40 mg ( 2 tabs) in am and 20 mg ( 1 tab) in afternoon.   traZODone 50 MG tablet Commonly known as: DESYREL Take 100 mg by mouth at bedtime.   Trelegy Ellipta 200-62.5-25 MCG/ACT Aepb Generic drug: Fluticasone-Umeclidin-Vilant Inhale 1 puff into the lungs daily.   Vitamin D-3 125 MCG (5000 UT) Tabs Take 5,000 Units by mouth daily.               Durable Medical Equipment  (From admission, onward)           Start     Ordered   06/08/23 1516  For home use only DME Walker rolling  Once       Question Answer Comment  Walker: With 5 Inch Wheels   Patient needs a walker to treat with the following condition Ambulatory dysfunction      06/08/23 1515   06/08/23 1516  For home use only DME Bedside commode  Once       Question:  Patient needs a bedside commode to treat with the following condition  Answer:  Ambulatory dysfunction   06/08/23 1515            Allergies  Allergen Reactions   Penicillins     Childhood allergy not sure what happens      Jardiance  [Empagliflozin ] Other (See Comments)    Yeast  infection    Consultations: None   Procedures/Studies: DG Ankle Complete Right Result Date: 06/08/2023 CLINICAL DATA:  Pain. EXAM: RIGHT ANKLE - COMPLETE 3+ VIEW COMPARISON:  Right ankle radiographs dated 08/17/2021. FINDINGS: No acute fracture or malalignment. Ankle mortise is congruent. Mild degenerative changes of the midfoot. Nonspecific generalized subcutaneous edema of the right lower extremity. IMPRESSION: 1. No acute osseous abnormality. 2. Nonspecific generalized subcutaneous edema of the right lower extremity. Electronically Signed   By: Mannie Seek M.D.   On: 06/08/2023 14:45     Subjective: No acute issues or events overnight, ankle pain improving, denies nausea vomiting diarrhea constipation headache fevers chills or chest pain   Discharge Exam: Vitals:   06/08/23 0756 06/08/23 1431  BP: 139/82 111/75  Pulse: 79 74  Resp: 18 18  Temp: 98.1 F (36.7 C) 97.9 F (36.6 C)  SpO2: 93% 93%   Vitals:   06/08/23 0351 06/08/23 0754 06/08/23 0756 06/08/23 1431  BP: (!) 127/91 139/82 139/82 111/75  Pulse: 78 79 79 74  Resp: 20 18 18 18   Temp: 97.6 F (36.4 C) 98.1 F (36.7 C) 98.1 F (36.7 C) 97.9 F (36.6 C)  TempSrc: Oral  Oral   SpO2: 99% 96% 93% 93%  Height:        General: Pt is alert, awake, not in acute distress Cardiovascular: RRR, S1/S2 +, no rubs, no gallops Respiratory: CTA bilaterally, no wheezing, no rhonchi Abdominal: Soft, NT, ND, bowel sounds + Extremities: Multiple excoriations and ecchymosis over bilateral lower extremities distal to the knees, blanching erythema right lower extremity near the ankle.    The results of significant diagnostics from this hospitalization (including imaging, microbiology, ancillary and laboratory) are listed below for reference.     Microbiology: No results found for this or any previous visit (from the past 240 hours).   Labs: BNP (last 3 results) No results for input(s): "BNP" in the last 8760  hours. Basic Metabolic Panel: Recent Labs  Lab 06/07/23 1520 06/07/23 1905 06/08/23 0041 06/08/23 0631 06/08/23 1145  NA 123* 124* 129* 133* 132*  K 4.3 4.0 4.0 3.9 4.0  CL 91* 95* 100 103 103  CO2 23 21* 22 22 21*  GLUCOSE 96 82 95 90 98  BUN 10 10 7* 7* 5*  CREATININE 0.88 0.81 0.73 0.68 0.67  CALCIUM  8.5* 7.9* 8.7* 8.6* 8.5*   Liver Function Tests: Recent Labs  Lab 06/07/23 1520  AST 22  ALT 21  ALKPHOS 112  BILITOT 0.4  PROT 6.0*  ALBUMIN 2.8*   No results for input(s): "LIPASE", "AMYLASE" in the last 168 hours. No results for input(s): "AMMONIA" in the last 168 hours. CBC: Recent Labs  Lab 06/07/23 1520 06/08/23 0631  WBC 7.3 8.4  HGB 10.5* 10.5*  HCT 31.5* 30.7*  MCV 88.7 87.5  PLT 366 371   Cardiac Enzymes: No results for input(s): "CKTOTAL", "CKMB", "CKMBINDEX", "TROPONINI" in the last 168 hours. BNP: Invalid input(s): "POCBNP" CBG: No results for input(s): "GLUCAP" in the last 168 hours. D-Dimer No results for input(s): "DDIMER" in the last 72 hours. Hgb A1c No results for input(s): "HGBA1C" in the last 72 hours. Lipid Profile No results for input(s): "CHOL", "HDL", "LDLCALC", "TRIG", "CHOLHDL", "LDLDIRECT" in the last 72 hours. Thyroid  function studies Recent Labs    06/07/23 2235  TSH 3.933   Anemia work up Recent Labs    06/08/23 1145 06/08/23 1147  VITAMINB12 265  --   FOLATE 30.2  --   FERRITIN 79  --   TIBC 242*  --   IRON 27*  --   RETICCTPCT  --  1.6   Urinalysis No results found for: "COLORURINE", "APPEARANCEUR", "LABSPEC", "PHURINE", "GLUCOSEU", "HGBUR", "BILIRUBINUR", "KETONESUR", "PROTEINUR", "UROBILINOGEN", "NITRITE", "LEUKOCYTESUR" Sepsis Labs Recent Labs  Lab 06/07/23 1520 06/08/23 0631  WBC 7.3 8.4   Microbiology No results found for this or any previous visit (from the past 240 hours).   Time coordinating discharge: Over 30 minutes  SIGNED:   Haydee Lipa, DO [facility] 06/08/2023, 3:17  PM Pager   If 7PM-7AM, please contact night-coverage [email or web address]

## 2023-06-08 NOTE — Evaluation (Signed)
 Occupational Therapy Evaluation Patient Details Name: Tracy Oconnor MRN: [id number] DOB: 06-04-1960 Today's Date: 06/08/2023   History of Present Illness   (P) Patient is 63 y.o. female  presents to the ER with increasing worsening lower extremity wounds. PMH significant of chronic HFpEF, CAD, COPD, OSA, HTN, HLD, bipolar disorder.     Clinical Impressions Pt admitted based on above, and was seen based on problem list below. PTA pt was receiving min to mod assistance with LB ADLs and shower transfers. Today pt is requiring set up  to min  assist for ADLs. Bed mobility and functional transfers are  CGA. Recommendation of HHOT to optimize independence levels. OT will continue to follow acutely to maximize functional independence.        If plan is discharge home, recommend the following:   A little help with walking and/or transfers;A little help with bathing/dressing/bathroom;Assistance with cooking/housework     Functional Status Assessment   Patient has had a recent decline in their functional status and demonstrates the ability to make significant improvements in function in a reasonable and predictable amount of time.     Equipment Recommendations   BSC/3in1     Recommendations for Other Services         Precautions/Restrictions   Precautions Precautions: Fall Recall of Precautions/Restrictions: Intact Restrictions Weight Bearing Restrictions Per Provider Order: No     Mobility Bed Mobility Overal bed mobility: Needs Assistance Bed Mobility: Supine to Sit     Supine to sit: Supervision, HOB elevated, Used rails     General bed mobility comments: Increased time, especially to scoot EOB    Transfers Overall transfer level: Needs assistance Equipment used: Rolling walker (2 wheels) Transfers: Sit to/from Stand, Bed to chair/wheelchair/BSC Sit to Stand: From elevated surface, Supervision     Step pivot transfers: Supervision     General  transfer comment: S for safety      Balance Overall balance assessment: Needs assistance Sitting-balance support: Bilateral upper extremity supported, Feet supported Sitting balance-Leahy Scale: Good     Standing balance support: Bilateral upper extremity supported, During functional activity, Reliant on assistive device for balance Standing balance-Leahy Scale: Fair Standing balance comment: Posterior lean without UE support       ADL either performed or assessed with clinical judgement   ADL Overall ADL's : Needs assistance/impaired Eating/Feeding: Set up;Sitting   Grooming: Wash/dry hands;Contact guard assist;Standing Grooming Details (indicate cue type and reason): Posterior lean without UE support         Upper Body Dressing : Set up;Sitting   Lower Body Dressing: Minimal assistance;Sit to/from stand Lower Body Dressing Details (indicate cue type and reason): min assist to thread legs Toilet Transfer: Ambulation;BSC/3in1;Rolling walker (2 wheels);Contact guard assist;Cueing for sequencing;Cueing for safety Toilet Transfer Details (indicate cue type and reason): Cueing for safety with RW Toileting- Clothing Manipulation and Hygiene: Contact guard assist;Sit to/from stand Toileting - Clothing Manipulation Details (indicate cue type and reason): CGA for balance     Functional mobility during ADLs: Contact guard assist;Supervision/safety;Rolling walker (2 wheels) General ADL Comments: Friend assists at baseline with LB dressing     Vision Baseline Vision/History: 0 No visual deficits Vision Assessment?: No apparent visual deficits            Pertinent Vitals/Pain Pain Assessment Pain Assessment: Faces Faces Pain Scale: Hurts even more Pain Location: Bil LEs Pain Descriptors / Indicators: Aching, Discomfort Pain Intervention(s): Repositioned     Extremity/Trunk Assessment Upper Extremity Assessment Upper Extremity Assessment: (P)  Defer to OT evaluation    Lower Extremity Assessment Lower Extremity Assessment: (P) Generalized weakness   Cervical / Trunk Assessment Cervical / Trunk Assessment: (P) Normal   Communication Communication Communication: (P) No apparent difficulties   Cognition Arousal: Alert Behavior During Therapy: WFL for tasks assessed/performed Cognition: No apparent impairments   Following commands: Intact       Cueing  General Comments   Cueing Techniques: Verbal cues  VSS on 2L O2           Home Living Family/patient expects to be discharged to:: (P) Private residence Living Arrangements: (P) Alone Available Help at Discharge: (P) Friend(s);Available 24 hours/day (D/c to friends house) Type of Home: (P) House Home Access: (P) Stairs to enter Entrance Stairs-Number of Steps: (P) 3 Entrance Stairs-Rails: (P) Left (Vertical post/trellis) Home Layout: (P) One level     Bathroom Shower/Tub: (P) Tub/shower unit   Bathroom Toilet: (P) Standard Bathroom Accessibility: (P) Yes How Accessible: Accessible via walker Home Equipment: (P) Shower seat;Grab bars - toilet;Grab bars - tub/shower;Other (comment) (tripod cane)   Additional Comments: (P) friend assists with shower transfer (has a neighbor/friend that helps as needed (does cooking, and bathing))      Prior Functioning/Environment Prior Level of Function : (P) Independent/Modified Independent             Mobility Comments: (P) Use of tripod cane ADLs Comments: Assists with shower transfers and LB dressing    OT Problem List: Decreased strength;Decreased activity tolerance;Impaired balance (sitting and/or standing);Decreased safety awareness;Decreased knowledge of use of DME or AE   OT Treatment/Interventions: Self-care/ADL training;Therapeutic exercise;DME and/or AE instruction;Energy conservation;Therapeutic activities;Patient/family education;Balance training      OT Goals(Current goals can be found in the care plan section)   Acute  Rehab OT Goals Patient Stated Goal: To go home OT Goal Formulation: With patient Time For Goal Achievement: 06/22/23 Potential to Achieve Goals: Good   OT Frequency:  Min 2X/week       AM-PAC OT "6 Clicks" Daily Activity     Outcome Measure Help from another person eating meals?: None Help from another person taking care of personal grooming?: A Little Help from another person toileting, which includes using toliet, bedpan, or urinal?: A Little Help from another person bathing (including washing, rinsing, drying)?: A Little Help from another person to put on and taking off regular upper body clothing?: A Little Help from another person to put on and taking off regular lower body clothing?: A Little 6 Click Score: 19   End of Session Equipment Utilized During Treatment: Gait belt;Rolling walker (2 wheels) Nurse Communication: Mobility status  Activity Tolerance: Patient tolerated treatment well Patient left: in chair;with call bell/phone within reach  OT Visit Diagnosis: Unsteadiness on feet (R26.81);Other abnormalities of gait and mobility (R26.89);Repeated falls (R29.6);Muscle weakness (generalized) (M62.81)                Time: 6213-0865 OT Time Calculation (min): 43 min Charges:  OT General Charges $OT Visit: 1 Visit OT Evaluation $OT Eval Moderate Complexity: 1 Mod OT Treatments $Self Care/Home Management : 23-37 mins  Delmer Ferraris, OT  Acute Rehabilitation Services Office [phone] Secure chat preferred   Mickael Alamo 06/08/2023, 4:15 PM

## 2023-06-08 NOTE — Consult Note (Signed)
 WOC Nurse Consult Note: patient with history of lower extremity edema and wounds likely venous  Reason for Consult: R lower extremity wounds  Wound type: full thickness likely r/t venous insufficiency  Pressure Injury POA: NA not related to pressure  Measurement: see nursing flowsheet  Wound bed: wound beds appear with varying amounts of yellow gray necrotic tissue  Drainage (amount, consistency, odor) see nursing flowsheet  Periwound: erythema and edema  Dressing procedure/placement/frequency:  Cleanse R lower leg (intact skin and open wounds) with Vashe wound cleanser Timm Foot [phone]) do not rinse and allow to air dry. Apply Medihoney to wound beds daily, cover with dry gauze and ABD pads. Secure with Kerlix roll gauze beginning above toes and ending right below knee.  Apply Ace bandage wrapped in same fasion asKerlix for light compression. Patient should keep leg elevated as much as possible.   It appears patient may be receiving home care for these wounds. Patient would benefit from ongoing management of legs with vascular or wound care center.  Addressing edema will likely be key in management.   POC discussed with bedside nurse. WOc team will not follow. Re-consult if further needs arise.   Thank you,    Ronni Colace MSN, RN-BC, [employer] [phone]

## 2023-06-08 NOTE — Evaluation (Signed)
 Physical Therapy Evaluation Patient Details Name: Tracy Oconnor MRN: [id number] DOB: 1960-04-20 Today's Date: 06/08/2023  History of Present Illness  Patient is 63 y.o. female  presents to the ER with increasing worsening lower extremity wounds. PMH significant of chronic HFpEF, CAD, COPD, OSA, HTN, HLD, bipolar disorder.  Clinical Impression  Tracy Oconnor is 63 y.o. female admitted with above HPI and diagnosis. Patient is currently limited by functional impairments below (see PT problem list). Patient lives alone but is staying with friends currently and is mod ind with [facility] form household mobility and requires assist for ADL's at baseline. Currently pt requires bil UE support for transfers and gait and benefits from youth RW to mobilize. Supervision-CGA for safety with all mobility. Pt able to complete stair negotiation today with single rail and SPC with CGA and no overt LOB. Patient will benefit from continued skilled PT interventions to address impairments and progress independence with mobility, recommending HH services. Acute PT will follow and progress as able.         If plan is discharge home, recommend the following: A little help with walking and/or transfers;A little help with bathing/dressing/bathroom;Assistance with cooking/housework;Direct supervision/assist for medications management;Assist for transportation;Help with stairs or ramp for entrance   Can travel by private vehicle        Equipment Recommendations Rolling walker (2 wheels) (youth)  Recommendations for Other Services       Functional Status Assessment Patient has had a recent decline in their functional status and demonstrates the ability to make significant improvements in function in a reasonable and predictable amount of time.     Precautions / Restrictions Precautions Precautions: Fall Recall of Precautions/Restrictions: Intact Restrictions Weight Bearing Restrictions Per Provider Order: No       Mobility  Bed Mobility               General bed mobility comments: OOB in recliner    Transfers Overall transfer level: Needs assistance Equipment used: Rolling walker (2 wheels) Transfers: Sit to/from Stand Sit to Stand: Supervision           General transfer comment: sup for safety to stand and sit to recliner    Ambulation/Gait Ambulation/Gait assistance: Supervision, Contact guard assist Gait Distance (Feet): 130 Feet Assistive device: Rolling walker (2 wheels) Gait Pattern/deviations: Step-through pattern, Decreased stance time - right, Decreased stride length Gait velocity: decr     General Gait Details: overall steady with 1x LOB and pt able to recover with use of RW.  Stairs Stairs: Yes Stairs assistance: Contact guard assist Stair Management: One rail Left, Step to pattern, Forwards, With cane Number of Stairs: 4 General stair comments: cues for step sequence "up with strong, down with weak" and cues for cane placement. no overt LOB.  Wheelchair Mobility     Tilt Bed    Modified Rankin (Stroke Patients Only)       Balance Overall balance assessment: Needs assistance Sitting-balance support: Feet supported, No upper extremity supported Sitting balance-Leahy Scale: Good     Standing balance support: During functional activity, Reliant on assistive device for balance, Bilateral upper extremity supported Standing balance-Leahy Scale: Fair                               Pertinent Vitals/Pain Pain Assessment Pain Assessment: Faces Faces Pain Scale: Hurts even more Pain Location: Bil LEs Pain Descriptors / Indicators: Aching, Discomfort Pain Intervention(s): Limited activity within patient's  tolerance, Monitored during session, Repositioned    Home Living Family/patient expects to be discharged to:: Private residence Living Arrangements: Alone Available Help at Discharge: Friend(s);Available 24 hours/day (D/c to friends  house) Type of Home: House Home Access: Stairs to enter Entrance Stairs-Rails: Left (Vertical post/trellis) Entrance Stairs-Number of Steps: 3   Home Layout: One level Home Equipment: Shower seat;Grab bars - toilet;Grab bars - tub/shower;Other (comment) (tripod cane) Additional Comments: friend assists with shower transfer (has a neighbor/friend that helps as needed (does cooking, and bathing))    Prior Function Prior Level of Function : Independent/Modified Independent             Mobility Comments: Use of tripod cane ADLs Comments: Assists with shower transfers and LB dressing     Extremity/Trunk Assessment   Upper Extremity Assessment Upper Extremity Assessment: Defer to OT evaluation    Lower Extremity Assessment Lower Extremity Assessment: Generalized weakness (pain due to wounds)    Cervical / Trunk Assessment Cervical / Trunk Assessment: Kyphotic  Communication   Communication Communication: No apparent difficulties    Cognition Arousal: Alert Behavior During Therapy: WFL for tasks assessed/performed   PT - Cognitive impairments: No apparent impairments                         Following commands: Intact       Cueing Cueing Techniques: Verbal cues     General Comments General comments (skin integrity, edema, etc.): VSS on 2L O2    Exercises     Assessment/Plan    PT Assessment Patient needs continued PT services  PT Problem List Decreased strength;Decreased activity tolerance;Decreased balance;Decreased mobility;Decreased knowledge of use of DME;Decreased safety awareness;Decreased knowledge of precautions;Cardiopulmonary status limiting activity;Obesity;Pain       PT Treatment Interventions DME instruction;Balance training;Patient/family education;Neuromuscular re-education;Therapeutic exercise;Therapeutic activities;Functional mobility training;Stair training;Gait training    PT Goals (Current goals can be found in the Care Plan  section)  Acute Rehab PT Goals Patient Stated Goal: stop hurting PT Goal Formulation: With patient Time For Goal Achievement: 06/22/23 Potential to Achieve Goals: Good    Frequency Min 2X/week     Co-evaluation               AM-PAC PT "6 Clicks" Mobility  Outcome Measure Help needed turning from your back to your side while in a flat bed without using bedrails?: A Little Help needed moving from lying on your back to sitting on the side of a flat bed without using bedrails?: A Little Help needed moving to and from a bed to a chair (including a wheelchair)?: A Little Help needed standing up from a chair using your arms (e.g., wheelchair or bedside chair)?: A Little Help needed to walk in hospital room?: A Little Help needed climbing 3-5 steps with a railing? : A Little 6 Click Score: 18    End of Session Equipment Utilized During Treatment: Gait belt;Oxygen  Activity Tolerance: Patient tolerated treatment well Patient left: in chair;with call bell/phone within reach Nurse Communication: Mobility status PT Visit Diagnosis: Other abnormalities of gait and mobility (R26.89);Muscle weakness (generalized) (M62.81);Difficulty in walking, not elsewhere classified (R26.2)    Time: 1547-1610 PT Time Calculation (min) (ACUTE ONLY): 23 min   Charges:   PT Evaluation $PT Eval Moderate Complexity: 1 Mod PT Treatments $Gait Training: 8-22 mins PT General Charges $$ ACUTE PT VISIT: 1 Visit         Tish Forge, DPT Acute Rehabilitation Services Office [phone]  06/08/23 4:21 PM

## 2023-06-08 NOTE — TOC Transition Note (Signed)
 Transition of Care (TOC) - Discharge Note Sherin Dingwall RN, BSN Transitions of Care Unit 4E- RN Case Manager See Treatment Team for direct phone # [phone] cross coverage  Patient Details  Name: Tracy Oconnor MRN: [id number] Date of Birth: March 13, 1960  Transition of Care [facility]) CM/SW Contact:  Rox Cope, RN Phone Number: 06/08/2023, 4:30 PM   Clinical Narrative:    Orders for [facility] and DME needs placed, pt for potential d/c today.  CM spoke with pt at bedside- per pt she will be staying with friends here in [place] for awhile.  Address to Friends home: [address], [place] [address]  Friends to Transport home.   List provided for [facility] choice- Per CMS guidelines from [employer] website with star ratings (copy placed in shadow chart)- per pt she has used [facility] in past (Mar 2025) would like to see if they can service again, if not [facility] is backup.  Pt has [employer] who contracts with [employer] for DME needs- pt confirmed she has home 02 with [employer].   Call made to [employer] liaison for DME needs- RW-youth, and BSC- [employer] to process and deliver to room prior to discharge.   Call made to [facility]- spoke with Willetta Harpin - they are unable to accept referral at this time due to staffing.  Call made to [facility] liaison- Loetta Ringer- referral has been accepted - RN/PT/OT/aide needs. They will try to do start of care within 48 hr.   No further TOC needs noted.    Final next level of care: Home w Home Health Services Barriers to Discharge: No Barriers Identified   Patient Goals and CMS Choice Patient states their goals for this hospitalization and ongoing recovery are:: return home CMS Medicare.gov Compare Post Acute Care list provided to:: Patient Choice offered to / list presented to : Patient      Discharge Placement               Home w/ [facility]        Discharge Plan and Services Additional resources added to the After Visit Summary for     Discharge Planning  Services: CM Consult Post Acute Care Choice: Durable Medical Equipment, Home Health          DME Arranged: Bedside commode, Walker youth DME Agency: [facility] Date DME Agency Contacted: 06/08/23 Time DME Agency Contacted: 1600 Representative spoke with at DME Agency: Gladys Lamp HH Arranged: RN, PT, OT, Nurse's Aide HH Agency: [facility] Date [facility] Agency Contacted: 06/08/23 Time HH Agency Contacted: 1630 Representative spoke with at [facility] Agency: Loetta Ringer  Social Drivers of Health (SDOH) Interventions SDOH Screenings   Food Insecurity: No Food Insecurity (06/08/2023)  Housing: Low Risk  (06/08/2023)  Transportation Needs: No Transportation Needs (06/08/2023)  Utilities: Not At Risk (06/08/2023)  Social Connections: Moderately Isolated (06/08/2023)  Tobacco Use: Medium Risk (06/07/2023)     Readmission Risk Interventions     No data to display

## 2023-06-08 NOTE — Progress Notes (Signed)
 PT Cancellation Note  Patient Details Name: Tracy Oconnor MRN: [id number] DOB: 10-01-1960   Cancelled Treatment:    Reason Eval/Treat Not Completed: Patient not medically ready (Waiting on imaging for ankle x-ray. Will follow up after result as pt able and schedule allows.)    Corbin Dess PT, DPT Acute Rehabilitation Services Office [phone]  06/08/23 11:18 AM

## 2023-06-08 NOTE — Progress Notes (Signed)
 RN went over DC instructions with the patient and she stated understanding. IV has been removed, home medications have been returned to the patient, TOC medications have been picked up. Pt ride at bedside DME has been delivered.

## 2023-06-17 DIAGNOSIS — L97812 Non-pressure chronic ulcer of other part of right lower leg with fat layer exposed: Secondary | ICD-10-CM | POA: Diagnosis not present

## 2023-06-17 DIAGNOSIS — I110 Hypertensive heart disease with heart failure: Secondary | ICD-10-CM | POA: Diagnosis not present

## 2023-06-17 DIAGNOSIS — I700 Atherosclerosis of aorta: Secondary | ICD-10-CM | POA: Diagnosis not present

## 2023-06-17 DIAGNOSIS — J449 Chronic obstructive pulmonary disease, unspecified: Secondary | ICD-10-CM | POA: Diagnosis not present

## 2023-06-17 DIAGNOSIS — D649 Anemia, unspecified: Secondary | ICD-10-CM | POA: Diagnosis not present

## 2023-06-17 DIAGNOSIS — I5032 Chronic diastolic (congestive) heart failure: Secondary | ICD-10-CM | POA: Diagnosis not present

## 2023-06-17 DIAGNOSIS — F319 Bipolar disorder, unspecified: Secondary | ICD-10-CM | POA: Diagnosis not present

## 2023-06-17 DIAGNOSIS — I872 Venous insufficiency (chronic) (peripheral): Secondary | ICD-10-CM | POA: Diagnosis not present

## 2023-06-17 DIAGNOSIS — L97213 Non-pressure chronic ulcer of right calf with necrosis of muscle: Secondary | ICD-10-CM | POA: Diagnosis not present

## 2023-06-22 DIAGNOSIS — I5032 Chronic diastolic (congestive) heart failure: Secondary | ICD-10-CM | POA: Diagnosis not present

## 2023-06-22 DIAGNOSIS — I110 Hypertensive heart disease with heart failure: Secondary | ICD-10-CM | POA: Diagnosis not present

## 2023-06-22 DIAGNOSIS — J449 Chronic obstructive pulmonary disease, unspecified: Secondary | ICD-10-CM | POA: Diagnosis not present

## 2023-06-22 DIAGNOSIS — I700 Atherosclerosis of aorta: Secondary | ICD-10-CM | POA: Diagnosis not present

## 2023-06-22 DIAGNOSIS — D649 Anemia, unspecified: Secondary | ICD-10-CM | POA: Diagnosis not present

## 2023-06-22 DIAGNOSIS — L97213 Non-pressure chronic ulcer of right calf with necrosis of muscle: Secondary | ICD-10-CM | POA: Diagnosis not present

## 2023-06-22 DIAGNOSIS — I872 Venous insufficiency (chronic) (peripheral): Secondary | ICD-10-CM | POA: Diagnosis not present

## 2023-06-22 DIAGNOSIS — F319 Bipolar disorder, unspecified: Secondary | ICD-10-CM | POA: Diagnosis not present

## 2023-06-22 DIAGNOSIS — L97812 Non-pressure chronic ulcer of other part of right lower leg with fat layer exposed: Secondary | ICD-10-CM | POA: Diagnosis not present

## 2023-06-24 DIAGNOSIS — I872 Venous insufficiency (chronic) (peripheral): Secondary | ICD-10-CM | POA: Diagnosis not present

## 2023-06-24 DIAGNOSIS — G894 Chronic pain syndrome: Secondary | ICD-10-CM | POA: Diagnosis not present

## 2023-06-24 DIAGNOSIS — D649 Anemia, unspecified: Secondary | ICD-10-CM | POA: Diagnosis not present

## 2023-06-24 DIAGNOSIS — Z79891 Long term (current) use of opiate analgesic: Secondary | ICD-10-CM | POA: Diagnosis not present

## 2023-06-24 DIAGNOSIS — F319 Bipolar disorder, unspecified: Secondary | ICD-10-CM | POA: Diagnosis not present

## 2023-06-24 DIAGNOSIS — I5032 Chronic diastolic (congestive) heart failure: Secondary | ICD-10-CM | POA: Diagnosis not present

## 2023-06-24 DIAGNOSIS — I110 Hypertensive heart disease with heart failure: Secondary | ICD-10-CM | POA: Diagnosis not present

## 2023-06-24 DIAGNOSIS — M479 Spondylosis, unspecified: Secondary | ICD-10-CM | POA: Diagnosis not present

## 2023-06-24 DIAGNOSIS — J449 Chronic obstructive pulmonary disease, unspecified: Secondary | ICD-10-CM | POA: Diagnosis not present

## 2023-06-24 DIAGNOSIS — L97213 Non-pressure chronic ulcer of right calf with necrosis of muscle: Secondary | ICD-10-CM | POA: Diagnosis not present

## 2023-06-24 DIAGNOSIS — L97812 Non-pressure chronic ulcer of other part of right lower leg with fat layer exposed: Secondary | ICD-10-CM | POA: Diagnosis not present

## 2023-06-24 DIAGNOSIS — I700 Atherosclerosis of aorta: Secondary | ICD-10-CM | POA: Diagnosis not present

## 2023-06-29 DIAGNOSIS — I700 Atherosclerosis of aorta: Secondary | ICD-10-CM | POA: Diagnosis not present

## 2023-06-29 DIAGNOSIS — I5032 Chronic diastolic (congestive) heart failure: Secondary | ICD-10-CM | POA: Diagnosis not present

## 2023-06-29 DIAGNOSIS — I110 Hypertensive heart disease with heart failure: Secondary | ICD-10-CM | POA: Diagnosis not present

## 2023-06-29 DIAGNOSIS — I872 Venous insufficiency (chronic) (peripheral): Secondary | ICD-10-CM | POA: Diagnosis not present

## 2023-06-29 DIAGNOSIS — D649 Anemia, unspecified: Secondary | ICD-10-CM | POA: Diagnosis not present

## 2023-06-29 DIAGNOSIS — L97213 Non-pressure chronic ulcer of right calf with necrosis of muscle: Secondary | ICD-10-CM | POA: Diagnosis not present

## 2023-06-29 DIAGNOSIS — L97812 Non-pressure chronic ulcer of other part of right lower leg with fat layer exposed: Secondary | ICD-10-CM | POA: Diagnosis not present

## 2023-06-29 DIAGNOSIS — F319 Bipolar disorder, unspecified: Secondary | ICD-10-CM | POA: Diagnosis not present

## 2023-06-29 DIAGNOSIS — J449 Chronic obstructive pulmonary disease, unspecified: Secondary | ICD-10-CM | POA: Diagnosis not present

## 2023-06-30 DIAGNOSIS — J449 Chronic obstructive pulmonary disease, unspecified: Secondary | ICD-10-CM | POA: Diagnosis not present

## 2023-06-30 DIAGNOSIS — G4733 Obstructive sleep apnea (adult) (pediatric): Secondary | ICD-10-CM | POA: Diagnosis not present

## 2023-06-30 DIAGNOSIS — J9611 Chronic respiratory failure with hypoxia: Secondary | ICD-10-CM | POA: Diagnosis not present

## 2023-07-04 DIAGNOSIS — J449 Chronic obstructive pulmonary disease, unspecified: Secondary | ICD-10-CM | POA: Diagnosis not present

## 2023-07-04 DIAGNOSIS — E785 Hyperlipidemia, unspecified: Secondary | ICD-10-CM | POA: Diagnosis not present

## 2023-07-05 DIAGNOSIS — I5032 Chronic diastolic (congestive) heart failure: Secondary | ICD-10-CM | POA: Diagnosis not present

## 2023-07-05 DIAGNOSIS — I872 Venous insufficiency (chronic) (peripheral): Secondary | ICD-10-CM | POA: Diagnosis not present

## 2023-07-05 DIAGNOSIS — L97812 Non-pressure chronic ulcer of other part of right lower leg with fat layer exposed: Secondary | ICD-10-CM | POA: Diagnosis not present

## 2023-07-05 DIAGNOSIS — L97213 Non-pressure chronic ulcer of right calf with necrosis of muscle: Secondary | ICD-10-CM | POA: Diagnosis not present

## 2023-07-05 DIAGNOSIS — I110 Hypertensive heart disease with heart failure: Secondary | ICD-10-CM | POA: Diagnosis not present

## 2023-07-05 DIAGNOSIS — J449 Chronic obstructive pulmonary disease, unspecified: Secondary | ICD-10-CM | POA: Diagnosis not present

## 2023-07-05 DIAGNOSIS — D649 Anemia, unspecified: Secondary | ICD-10-CM | POA: Diagnosis not present

## 2023-07-05 DIAGNOSIS — F319 Bipolar disorder, unspecified: Secondary | ICD-10-CM | POA: Diagnosis not present

## 2023-07-05 DIAGNOSIS — I700 Atherosclerosis of aorta: Secondary | ICD-10-CM | POA: Diagnosis not present

## 2023-07-05 NOTE — Progress Notes (Signed)
 Cardiology Office Note:  .   Date:  07/06/2023  ID:  Tracy Oconnor, DOB 1960-07-01, MRN [id number] PCP: Lonie Roa, MD  [facility] Providers Cardiologist:  Nelia Balzarine, MD    History of Present Illness: .   Tracy Oconnor is a 63 y.o. female with a past medical history of hypertension, CAD no intervention with CTO of distal RCA, HFpEF, COPD with O2 at bedtime, OSA, palpitations, bipolar disorder, hyperlipidemia, former smoker cessation 2022.   08/26/2022 echo EF 55 to 60%, no RWMA, mild concentric LVH, grade 1 DD, moderate MR, mild AR 08/26/2022 abdominal ultrasound 2.2 cm dilation of the proximal abdominal aorta 08/13/2022 left heart cath severe single-vessel CAD with CTO of distal RCA with left-right collaterals, moderate LCA disease >> isosorbide  started 07/30/2022 Lexiscan  findings consistent with ischemia, intermediate risk  She [facility] with [facility] in June 2024 for evaluation of CAD and DOE at the behest of her pulmonologist, she apparently had a CT of her chest revealing CAD.  Lexiscan  was arranged revealing reversible ischemia >> left heart cath on 08/13/2022 revealed CTO of her RCA with left-to-right collaterals and recommendations for medical therapy. Admitted [facility] on 01/26/2023 to 01/29/2023, diagnosed with COVID-19, COPD exacerbation.  She was ultimately discharged home with home health services.  Evaluated 02/18/2023 for follow-up after hospitalization as outlined above, she was feeling better but still bothered by pedal edema.  We doubled her Demadex  started her on Jardiance  and advised to follow-up in 6 weeks.  Most recently evaluated by myself on 04/05/2023, she was much better from a cardiac perspective, weight was down 4 pounds, she did report a mycotic infection however this was the first of its nature and we discussed keeping track of this now that she was on an SGLT2 inhibitor, plans to follow-up in 3 months.  She was evaluated in [facility]  on 06/07/2023 for hyponatremia, also noted to have cellulitis, sodium was 123 on day of discharge it was 132.  She presents today for follow-up.  She has been doing relatively well from a cardiac perspective.  She is following closely with her pulmonologist, she is no longer needing to wear oxygen  during the day and only requiring it at night, she is being fitted for CPAP as well.  She has been dealing with ongoing right lower extremity wound, will see her PCP tomorrow to see if she needs to be referred to a wound specialist.  She has some shortness of breath however this is at her baseline and unchanged.  On occasion she has pedal edema but today she is not experiencing any.  We did previously start her on SGLT2 inhibitor however she had at least 3 mycotic infections and this has been appropriately discontinued.  She denies chest pain, palpitations, pnd, orthopnea, n, v, dizziness, syncope, edema, weight gain, or early satiety.    ROS: Review of Systems  Constitutional:  Positive for malaise/fatigue.  HENT: Negative.    Eyes: Negative.   Respiratory:  Positive for shortness of breath (baseline).   Cardiovascular:  Positive for leg swelling.  Gastrointestinal: Negative.   Genitourinary: Negative.   Musculoskeletal: Negative.   Skin: Negative.   Neurological: Negative.   Endo/Heme/Allergies:  Bruises/bleeds easily.  Psychiatric/Behavioral: Negative.      Studies Reviewed: .        Cardiac Studies & Procedures   ______________________________________________________________________________________________ CARDIAC CATHETERIZATION  CARDIAC CATHETERIZATION 08/13/2022  Conclusion Conclusions: Severe single-vessel coronary artery disease with chronic total occlusion of distal RCA with  robust left-to-right collaterals. Moderate left coronary artery disease with 60% mid LAD stenosis (RFR borderline at 0.90) and 50% D2 and OM2 stenoses. Normal left ventricular systolic function (LVEF 55-65%) with  mildly elevated filling pressure (LVEDP 18 mmHg).  Recommendations: Add isosorbide  mononitrate for antianginal therapy. Aggressive secondary prevention of coronary artery disease.  Sammy Crisp, MD [facility]  Findings Coronary Findings Diagnostic  Dominance: Right  Left Main Vessel is large. Vessel is angiographically normal.  Left Anterior Descending Mid LAD lesion is 60% stenosed. Pressure gradient was performed on the lesion using a GUIDEWIRE PRESSURE X 175 and CATH LAUNCHER 65F EBU3.5. RFR: 0.9.  First Diagonal Branch Vessel is moderate in size.  Second Diagonal Branch Vessel is moderate in size. 2nd Diag lesion is 50% stenosed.  Left Circumflex Vessel is moderate in size.  First Obtuse Marginal Branch Vessel is moderate in size.  Second Obtuse Marginal Branch Vessel is moderate in size. 2nd Mrg lesion is 50% stenosed.  Third Obtuse Marginal Branch Vessel is small in size.  Right Coronary Artery Vessel is moderate in size. Mid RCA lesion is 60% stenosed. Dist RCA-1 lesion is 100% stenosed. The lesion is chronically occluded with bridging collateral flow. Dist RCA-2 lesion is 100% stenosed. The lesion is chronically occluded with left-to-right collateral flow.  Right Posterior Descending Artery Collaterals RPDA filled by collaterals from Dist LAD.  First Right Posterolateral Branch Collaterals 1st RPL filled by collaterals from 2nd Sept.  Intervention  No interventions have been documented.   STRESS TESTS  MYOCARDIAL PERFUSION IMAGING 07/30/2022  Narrative   Findings are consistent with ischemia involving basal, mid and apical portion of the inferior wall. The study is intermediate risk secondary to mildly diminished EF.   No ST deviation was noted.   Left ventricular function is abnormal. Global function is mildly reduced. Nuclear stress EF: 49 %. The left ventricular ejection fraction is mildly decreased (45-54%). End diastolic cavity size  is normal.   Prior study not available for comparison.   ECHOCARDIOGRAM  ECHOCARDIOGRAM COMPLETE 08/26/2022  Narrative ECHOCARDIOGRAM REPORT    Patient Name:   Tracy Oconnor Date of Exam: 08/26/2022 Medical Rec #:  [id number]        Height:       60.0 in Accession #:    [id number]       Weight:       166.0 lb Date of Birth:  20-Mar-1960        BSA:          1.724 m Patient Age:    62 years         BP:           140/5988 mmHg Patient Gender: F                HR:           62 bpm. Exam Location:  [facility]  Procedure: 2D Echo, Cardiac Doppler, Color Doppler and Strain Analysis  Indications:    DOE (dyspnea on exertion) [R06.09 (ICD-10-CM)]; Coronary artery disease involving native coronary artery of native heart without angina pectoris [I25.10 (ICD-10-CM)]  History:        Patient has no prior history of Echocardiogram examinations. Risk Factors:Hypertension and Dyslipidemia.  Sonographer:    Kristen Petri RDCS Referring Phys: Pollie Bring [facility]  IMPRESSIONS   1. Left ventricular ejection fraction, by estimation, is 55 to 60%. The left ventricle has normal function. The left ventricle has no regional wall motion abnormalities. There is  mild concentric left ventricular hypertrophy. Left ventricular diastolic parameters are consistent with Grade I diastolic dysfunction (impaired relaxation). The average left ventricular global longitudinal strain is -13.8 %. The global longitudinal strain is abnormal. 2. Right ventricular systolic function is mildly reduced. The right ventricular size is normal. Mildly increased right ventricular wall thickness. Tricuspid regurgitation signal is inadequate for assessing PA pressure. 3. The mitral valve is degenerative. Moderate mitral valve regurgitation. No evidence of mitral stenosis. 4. The aortic valve is tricuspid. Aortic valve regurgitation is mild. No aortic stenosis is present. 5. Aortic Normal DTA.  FINDINGS Left Ventricle: Left  ventricular ejection fraction, by estimation, is 55 to 60%. The left ventricle has normal function. The left ventricle has no regional wall motion abnormalities. The average left ventricular global longitudinal strain is -13.8 %. The global longitudinal strain is abnormal. The left ventricular internal cavity size was normal in size. There is mild concentric left ventricular hypertrophy. Left ventricular diastolic parameters are consistent with Grade I diastolic dysfunction (impaired relaxation). Indeterminate filling pressures.  Right Ventricle: The right ventricular size is normal. Mildly increased right ventricular wall thickness. Right ventricular systolic function is mildly reduced. Tricuspid regurgitation signal is inadequate for assessing PA pressure.  Left Atrium: Left atrial size was normal in size.  Right Atrium: Right atrial size was normal in size.  Pericardium: There is no evidence of pericardial effusion. Presence of epicardial fat layer.  Mitral Valve: The mitral valve is degenerative in appearance. Mild mitral annular calcification. Moderate mitral valve regurgitation. No evidence of mitral valve stenosis.  Tricuspid Valve: The tricuspid valve is normal in structure. Tricuspid valve regurgitation is trivial. No evidence of tricuspid stenosis.  Aortic Valve: The aortic valve is tricuspid. Aortic valve regurgitation is mild. No aortic stenosis is present.  Pulmonic Valve: The pulmonic valve was normal in structure. Pulmonic valve regurgitation is trivial. No evidence of pulmonic stenosis.  Aorta: The aortic root and ascending aorta are structurally normal, with no evidence of dilitation, the aortic arch was not well visualized and Normal DTA.  Venous: The pulmonary veins were not well visualized. The inferior vena cava was not well visualized.  IAS/Shunts: No atrial level shunt detected by color flow Doppler.   LEFT VENTRICLE PLAX 2D LVIDd:         4.40 cm    Diastology LVIDs:         3.10 cm   LV e' medial:    4.03 cm/s LV PW:         1.20 cm   LV E/e' medial:  16.1 LV IVS:        1.10 cm   LV e' lateral:   6.53 cm/s LVOT diam:     2.00 cm   LV E/e' lateral: 9.9 LV SV:         65 LV SV Index:   38        2D Longitudinal Strain LVOT Area:     3.14 cm  2D Strain GLS Avg:     -13.8 %   RIGHT VENTRICLE            IVC RV Basal diam:  2.70 cm    IVC diam: 1.30 cm RV S prime:     9.68 cm/s TAPSE (M-mode): 1.6 cm  LEFT ATRIUM             Index        RIGHT ATRIUM          Index LA diam:  3.60 cm 2.09 cm/m   RA Area:     9.79 cm LA Vol (A2C):   25.5 ml 14.79 ml/m  RA Volume:   19.40 ml 11.25 ml/m LA Vol (A4C):   23.1 ml 13.40 ml/m LA Biplane Vol: 24.3 ml 14.09 ml/m AORTIC VALVE LVOT Vmax:   103.25 cm/s LVOT Vmean:  63.150 cm/s LVOT VTI:    0.206 m  AORTA Ao Root diam: 2.80 cm Ao Asc diam:  3.50 cm Ao Desc diam: 2.40 cm  MR Peak grad:    81.0 mmHg    TRICUSPID VALVE MR Mean grad:    49.0 mmHg    TR Peak grad:   8.1 mmHg MR Vmax:         450.00 cm/s  TR Vmax:        142.00 cm/s MR Vmean:        322.0 cm/s MR PISA:         4.54 cm     SHUNTS MR PISA Eff ROA: 39 mm       Systemic VTI:  0.21 m MR PISA Radius:  0.85 cm      Systemic Diam: 2.00 cm MV E velocity: 64.75 cm/s MV A velocity: 83.85 cm/s MV E/A ratio:  0.77  Zoe Hinds MD Electronically signed by Zoe Hinds MD Signature Date/Time: 08/26/2022/12:37:05 PM    Final          ______________________________________________________________________________________________      Risk Assessment/Calculations:             Physical Exam:   VS:  BP 100/60   Pulse 70   Ht 5' (1.524 m)   Wt 160 lb (72.6 kg)   SpO2 97%   BMI 31.25 kg/m    Wt Readings from Last 3 Encounters:  07/06/23 160 lb (72.6 kg)  04/05/23 168 lb (76.2 kg)  02/18/23 171 lb (77.6 kg)    GEN: No acute distress NECK: No JVD; No carotid bruits CARDIAC: RRR, no murmurs, rubs,  gallops RESPIRATORY:  diminished in bases, expiratory wheezing ABDOMEN: Soft, non-tender, non-distended EXTREMITIES:  no pedal edema  ASSESSMENT AND PLAN: .   Coronary artery disease -left heart cath revealed severe single-vessel CAD with CTO of distal RCA with left-right collaterals, moderate LCA disease. Stable with no anginal symptoms. No indication for ischemic evaluation.  Continue aspirin  81 mg daily, continue Lipitor, continue isosorbide  30 mg daily, continue nitroglycerin  as needed.  Hypertensive heart disease with heart failure - NYHA class II. Continue continue losartan  25 mg daily, continue Demadex  currently taking 40 mg in the morning and 20 mg in the afternoon.   She has had 3 mycotic infections while she was on Jardiance  so this has been appropriately discontinued.  I did encourage her to continue to watch her weight closely.  Hyperlipidemia/elevated LPa-most recent LDL on 08/13/2022 was 62, LP(a) 191.  Continue Lipitor 40 mg daily, will repeat LFTs and LDL.  Hypertension-blood pressure is controlled at 100/60. Continue losartan  25 mg daily.   COPD -former smoker complete cessation x 2 years, followed by Dr. Corita Diego, only requiring oxygen  at bedtime now which is an improvement for her.  Moderate mitral regurgitation-noted on most recent echo, no indication for repeat imaging at this time.     Dispo: CBC, c-Met, direct LDL, follow-up in 6 months.  Signed, Terrance Ferretti, NP

## 2023-07-06 ENCOUNTER — Ambulatory Visit: Attending: Cardiology | Admitting: Cardiology

## 2023-07-06 ENCOUNTER — Encounter: Payer: Self-pay | Admitting: Cardiology

## 2023-07-06 VITALS — BP 100/60 | HR 70 | Ht 60.0 in | Wt 160.0 lb

## 2023-07-06 DIAGNOSIS — I10 Essential (primary) hypertension: Secondary | ICD-10-CM

## 2023-07-06 DIAGNOSIS — M479 Spondylosis, unspecified: Secondary | ICD-10-CM | POA: Insufficient documentation

## 2023-07-06 DIAGNOSIS — I2510 Atherosclerotic heart disease of native coronary artery without angina pectoris: Secondary | ICD-10-CM

## 2023-07-06 DIAGNOSIS — G894 Chronic pain syndrome: Secondary | ICD-10-CM

## 2023-07-06 DIAGNOSIS — E7841 Elevated Lipoprotein(a): Secondary | ICD-10-CM

## 2023-07-06 DIAGNOSIS — E785 Hyperlipidemia, unspecified: Secondary | ICD-10-CM

## 2023-07-06 DIAGNOSIS — I5032 Chronic diastolic (congestive) heart failure: Secondary | ICD-10-CM

## 2023-07-06 DIAGNOSIS — Z79891 Long term (current) use of opiate analgesic: Secondary | ICD-10-CM

## 2023-07-06 HISTORY — DX: Long term (current) use of opiate analgesic: Z79.891

## 2023-07-06 HISTORY — DX: Chronic pain syndrome: G89.4

## 2023-07-06 HISTORY — DX: Spondylosis, unspecified: M47.9

## 2023-07-06 NOTE — Patient Instructions (Signed)
 Medication Instructions:  Your physician recommends that you continue on your current medications as directed. Please refer to the Current Medication list given to you today.  *If you need a refill on your cardiac medications before your next appointment, please call your pharmacy*  Lab Work: Your physician recommends that you return for lab work in: Today for CMP, CBC and Direct LDL  If you have labs (blood work) drawn today and your tests are completely normal, you will receive your results only by: MyChart Message (if you have MyChart) OR A paper copy in the mail If you have any lab test that is abnormal or we need to change your treatment, we will call you to review the results.  Testing/Procedures: NONE  Follow-Up: At [facility], you and your health needs are our priority.  As part of our continuing mission to provide you with exceptional heart care, our providers are all part of one team.  This team includes your primary Cardiologist (physician) and Advanced Practice Providers or APPs (Physician Assistants and Nurse Practitioners) who all work together to provide you with the care you need, when you need it.  Your next appointment:   6 month(s)  Provider:   Pattricia Bores, NP Georgeana Kindler)    We recommend signing up for the patient portal called "MyChart".  Sign up information is provided on this After Visit Summary.  MyChart is used to connect with patients for Virtual Visits (Telemedicine).  Patients are able to view lab/test results, encounter notes, upcoming appointments, etc.  Non-urgent messages can be sent to your provider as well.   To learn more about what you can do with MyChart, go to [email or web address].   Other Instructions

## 2023-07-07 ENCOUNTER — Ambulatory Visit: Payer: Self-pay | Admitting: Cardiology

## 2023-07-07 DIAGNOSIS — Z139 Encounter for screening, unspecified: Secondary | ICD-10-CM | POA: Diagnosis not present

## 2023-07-07 DIAGNOSIS — E669 Obesity, unspecified: Secondary | ICD-10-CM | POA: Diagnosis not present

## 2023-07-07 DIAGNOSIS — L97912 Non-pressure chronic ulcer of unspecified part of right lower leg with fat layer exposed: Secondary | ICD-10-CM | POA: Diagnosis not present

## 2023-07-07 DIAGNOSIS — Z6830 Body mass index (BMI) 30.0-30.9, adult: Secondary | ICD-10-CM | POA: Diagnosis not present

## 2023-07-07 DIAGNOSIS — Z1339 Encounter for screening examination for other mental health and behavioral disorders: Secondary | ICD-10-CM | POA: Diagnosis not present

## 2023-07-07 DIAGNOSIS — Z0000 Encounter for general adult medical examination without abnormal findings: Secondary | ICD-10-CM | POA: Diagnosis not present

## 2023-07-07 DIAGNOSIS — Z1331 Encounter for screening for depression: Secondary | ICD-10-CM | POA: Diagnosis not present

## 2023-07-07 DIAGNOSIS — S81801D Unspecified open wound, right lower leg, subsequent encounter: Secondary | ICD-10-CM | POA: Diagnosis not present

## 2023-07-07 LAB — COMPREHENSIVE METABOLIC PANEL WITH GFR
ALT: 19 IU/L (ref 0–32)
AST: 21 IU/L (ref 0–40)
Albumin: 3.8 g/dL — ABNORMAL LOW (ref 3.9–4.9)
Alkaline Phosphatase: 148 IU/L — ABNORMAL HIGH (ref 44–121)
BUN/Creatinine Ratio: 17 (ref 12–28)
BUN: 20 mg/dL (ref 8–27)
Bilirubin Total: <0.2 mg/dL (ref 0.0–1.2)
CO2: 20 mmol/L (ref 20–29)
Calcium: 9.1 mg/dL (ref 8.7–10.3)
Chloride: 102 mmol/L (ref 96–106)
Creatinine, Ser: 1.2 mg/dL — ABNORMAL HIGH (ref 0.57–1.00)
Globulin, Total: 2.5 g/dL (ref 1.5–4.5)
Glucose: 81 mg/dL (ref 70–99)
Potassium: 5.2 mmol/L (ref 3.5–5.2)
Sodium: 138 mmol/L (ref 134–144)
Total Protein: 6.3 g/dL (ref 6.0–8.5)
eGFR: 51 mL/min/1.73 — ABNORMAL LOW

## 2023-07-07 LAB — CBC
Hematocrit: 36.8 % (ref 34.0–46.6)
Hemoglobin: 11.7 g/dL (ref 11.1–15.9)
MCH: 29.5 pg (ref 26.6–33.0)
MCHC: 31.8 g/dL (ref 31.5–35.7)
MCV: 93 fL (ref 79–97)
Platelets: 376 x10E3/uL (ref 150–450)
RBC: 3.97 x10E6/uL (ref 3.77–5.28)
RDW: 15.2 % (ref 11.7–15.4)
WBC: 9.2 x10E3/uL (ref 3.4–10.8)

## 2023-07-07 LAB — LDL CHOLESTEROL, DIRECT: LDL Direct: 59 mg/dL (ref 0–99)

## 2023-07-08 ENCOUNTER — Telehealth: Payer: Self-pay

## 2023-07-08 DIAGNOSIS — Z5982 Transportation insecurity: Secondary | ICD-10-CM

## 2023-07-09 DIAGNOSIS — I5032 Chronic diastolic (congestive) heart failure: Secondary | ICD-10-CM | POA: Diagnosis not present

## 2023-07-09 DIAGNOSIS — I110 Hypertensive heart disease with heart failure: Secondary | ICD-10-CM | POA: Diagnosis not present

## 2023-07-09 DIAGNOSIS — D649 Anemia, unspecified: Secondary | ICD-10-CM | POA: Diagnosis not present

## 2023-07-09 DIAGNOSIS — I872 Venous insufficiency (chronic) (peripheral): Secondary | ICD-10-CM | POA: Diagnosis not present

## 2023-07-09 DIAGNOSIS — F319 Bipolar disorder, unspecified: Secondary | ICD-10-CM | POA: Diagnosis not present

## 2023-07-09 DIAGNOSIS — J449 Chronic obstructive pulmonary disease, unspecified: Secondary | ICD-10-CM | POA: Diagnosis not present

## 2023-07-09 DIAGNOSIS — I700 Atherosclerosis of aorta: Secondary | ICD-10-CM | POA: Diagnosis not present

## 2023-07-09 DIAGNOSIS — L97812 Non-pressure chronic ulcer of other part of right lower leg with fat layer exposed: Secondary | ICD-10-CM | POA: Diagnosis not present

## 2023-07-09 DIAGNOSIS — L97213 Non-pressure chronic ulcer of right calf with necrosis of muscle: Secondary | ICD-10-CM | POA: Diagnosis not present

## 2023-07-10 DIAGNOSIS — I872 Venous insufficiency (chronic) (peripheral): Secondary | ICD-10-CM | POA: Diagnosis not present

## 2023-07-10 DIAGNOSIS — I110 Hypertensive heart disease with heart failure: Secondary | ICD-10-CM | POA: Diagnosis not present

## 2023-07-10 DIAGNOSIS — L97213 Non-pressure chronic ulcer of right calf with necrosis of muscle: Secondary | ICD-10-CM | POA: Diagnosis not present

## 2023-07-10 DIAGNOSIS — I700 Atherosclerosis of aorta: Secondary | ICD-10-CM | POA: Diagnosis not present

## 2023-07-10 DIAGNOSIS — D649 Anemia, unspecified: Secondary | ICD-10-CM | POA: Diagnosis not present

## 2023-07-10 DIAGNOSIS — I5032 Chronic diastolic (congestive) heart failure: Secondary | ICD-10-CM | POA: Diagnosis not present

## 2023-07-10 DIAGNOSIS — L97812 Non-pressure chronic ulcer of other part of right lower leg with fat layer exposed: Secondary | ICD-10-CM | POA: Diagnosis not present

## 2023-07-10 DIAGNOSIS — F319 Bipolar disorder, unspecified: Secondary | ICD-10-CM | POA: Diagnosis not present

## 2023-07-10 DIAGNOSIS — J449 Chronic obstructive pulmonary disease, unspecified: Secondary | ICD-10-CM | POA: Diagnosis not present

## 2023-07-13 DIAGNOSIS — L821 Other seborrheic keratosis: Secondary | ICD-10-CM | POA: Diagnosis not present

## 2023-07-13 DIAGNOSIS — L820 Inflamed seborrheic keratosis: Secondary | ICD-10-CM | POA: Diagnosis not present

## 2023-07-13 DIAGNOSIS — R233 Spontaneous ecchymoses: Secondary | ICD-10-CM | POA: Diagnosis not present

## 2023-07-13 DIAGNOSIS — L578 Other skin changes due to chronic exposure to nonionizing radiation: Secondary | ICD-10-CM | POA: Diagnosis not present

## 2023-07-14 DIAGNOSIS — L97213 Non-pressure chronic ulcer of right calf with necrosis of muscle: Secondary | ICD-10-CM | POA: Diagnosis not present

## 2023-07-14 DIAGNOSIS — I700 Atherosclerosis of aorta: Secondary | ICD-10-CM | POA: Diagnosis not present

## 2023-07-14 DIAGNOSIS — I872 Venous insufficiency (chronic) (peripheral): Secondary | ICD-10-CM | POA: Diagnosis not present

## 2023-07-14 DIAGNOSIS — F319 Bipolar disorder, unspecified: Secondary | ICD-10-CM | POA: Diagnosis not present

## 2023-07-14 DIAGNOSIS — I110 Hypertensive heart disease with heart failure: Secondary | ICD-10-CM | POA: Diagnosis not present

## 2023-07-14 DIAGNOSIS — I5032 Chronic diastolic (congestive) heart failure: Secondary | ICD-10-CM | POA: Diagnosis not present

## 2023-07-14 DIAGNOSIS — J449 Chronic obstructive pulmonary disease, unspecified: Secondary | ICD-10-CM | POA: Diagnosis not present

## 2023-07-14 DIAGNOSIS — L97812 Non-pressure chronic ulcer of other part of right lower leg with fat layer exposed: Secondary | ICD-10-CM | POA: Diagnosis not present

## 2023-07-14 DIAGNOSIS — D649 Anemia, unspecified: Secondary | ICD-10-CM | POA: Diagnosis not present

## 2023-07-15 DIAGNOSIS — J449 Chronic obstructive pulmonary disease, unspecified: Secondary | ICD-10-CM | POA: Diagnosis not present

## 2023-07-15 DIAGNOSIS — F319 Bipolar disorder, unspecified: Secondary | ICD-10-CM | POA: Diagnosis not present

## 2023-07-15 DIAGNOSIS — L97213 Non-pressure chronic ulcer of right calf with necrosis of muscle: Secondary | ICD-10-CM | POA: Diagnosis not present

## 2023-07-15 DIAGNOSIS — L97812 Non-pressure chronic ulcer of other part of right lower leg with fat layer exposed: Secondary | ICD-10-CM | POA: Diagnosis not present

## 2023-07-15 DIAGNOSIS — I700 Atherosclerosis of aorta: Secondary | ICD-10-CM | POA: Diagnosis not present

## 2023-07-15 DIAGNOSIS — I872 Venous insufficiency (chronic) (peripheral): Secondary | ICD-10-CM | POA: Diagnosis not present

## 2023-07-15 DIAGNOSIS — I5032 Chronic diastolic (congestive) heart failure: Secondary | ICD-10-CM | POA: Diagnosis not present

## 2023-07-15 DIAGNOSIS — D649 Anemia, unspecified: Secondary | ICD-10-CM | POA: Diagnosis not present

## 2023-07-15 DIAGNOSIS — I110 Hypertensive heart disease with heart failure: Secondary | ICD-10-CM | POA: Diagnosis not present

## 2023-07-16 DIAGNOSIS — L97912 Non-pressure chronic ulcer of unspecified part of right lower leg with fat layer exposed: Secondary | ICD-10-CM | POA: Diagnosis not present

## 2023-07-17 DIAGNOSIS — I700 Atherosclerosis of aorta: Secondary | ICD-10-CM | POA: Diagnosis not present

## 2023-07-17 DIAGNOSIS — J449 Chronic obstructive pulmonary disease, unspecified: Secondary | ICD-10-CM | POA: Diagnosis not present

## 2023-07-17 DIAGNOSIS — L97812 Non-pressure chronic ulcer of other part of right lower leg with fat layer exposed: Secondary | ICD-10-CM | POA: Diagnosis not present

## 2023-07-17 DIAGNOSIS — L97213 Non-pressure chronic ulcer of right calf with necrosis of muscle: Secondary | ICD-10-CM | POA: Diagnosis not present

## 2023-07-17 DIAGNOSIS — D649 Anemia, unspecified: Secondary | ICD-10-CM | POA: Diagnosis not present

## 2023-07-17 DIAGNOSIS — I110 Hypertensive heart disease with heart failure: Secondary | ICD-10-CM | POA: Diagnosis not present

## 2023-07-17 DIAGNOSIS — F319 Bipolar disorder, unspecified: Secondary | ICD-10-CM | POA: Diagnosis not present

## 2023-07-17 DIAGNOSIS — I872 Venous insufficiency (chronic) (peripheral): Secondary | ICD-10-CM | POA: Diagnosis not present

## 2023-07-17 DIAGNOSIS — I5032 Chronic diastolic (congestive) heart failure: Secondary | ICD-10-CM | POA: Diagnosis not present

## 2023-07-19 DIAGNOSIS — J449 Chronic obstructive pulmonary disease, unspecified: Secondary | ICD-10-CM | POA: Diagnosis not present

## 2023-07-19 DIAGNOSIS — I5032 Chronic diastolic (congestive) heart failure: Secondary | ICD-10-CM | POA: Diagnosis not present

## 2023-07-19 DIAGNOSIS — L97812 Non-pressure chronic ulcer of other part of right lower leg with fat layer exposed: Secondary | ICD-10-CM | POA: Diagnosis not present

## 2023-07-19 DIAGNOSIS — F319 Bipolar disorder, unspecified: Secondary | ICD-10-CM | POA: Diagnosis not present

## 2023-07-19 DIAGNOSIS — L97213 Non-pressure chronic ulcer of right calf with necrosis of muscle: Secondary | ICD-10-CM | POA: Diagnosis not present

## 2023-07-19 DIAGNOSIS — D649 Anemia, unspecified: Secondary | ICD-10-CM | POA: Diagnosis not present

## 2023-07-19 DIAGNOSIS — I110 Hypertensive heart disease with heart failure: Secondary | ICD-10-CM | POA: Diagnosis not present

## 2023-07-19 DIAGNOSIS — I872 Venous insufficiency (chronic) (peripheral): Secondary | ICD-10-CM | POA: Diagnosis not present

## 2023-07-19 DIAGNOSIS — I700 Atherosclerosis of aorta: Secondary | ICD-10-CM | POA: Diagnosis not present

## 2023-07-20 DIAGNOSIS — D649 Anemia, unspecified: Secondary | ICD-10-CM | POA: Diagnosis not present

## 2023-07-20 DIAGNOSIS — I5032 Chronic diastolic (congestive) heart failure: Secondary | ICD-10-CM | POA: Diagnosis not present

## 2023-07-20 DIAGNOSIS — Z6830 Body mass index (BMI) 30.0-30.9, adult: Secondary | ICD-10-CM | POA: Diagnosis not present

## 2023-07-20 DIAGNOSIS — Z79899 Other long term (current) drug therapy: Secondary | ICD-10-CM | POA: Diagnosis not present

## 2023-07-20 DIAGNOSIS — J449 Chronic obstructive pulmonary disease, unspecified: Secondary | ICD-10-CM | POA: Diagnosis not present

## 2023-07-20 DIAGNOSIS — I700 Atherosclerosis of aorta: Secondary | ICD-10-CM | POA: Diagnosis not present

## 2023-07-20 DIAGNOSIS — I110 Hypertensive heart disease with heart failure: Secondary | ICD-10-CM | POA: Diagnosis not present

## 2023-07-20 DIAGNOSIS — L97213 Non-pressure chronic ulcer of right calf with necrosis of muscle: Secondary | ICD-10-CM | POA: Diagnosis not present

## 2023-07-20 DIAGNOSIS — L97812 Non-pressure chronic ulcer of other part of right lower leg with fat layer exposed: Secondary | ICD-10-CM | POA: Diagnosis not present

## 2023-07-20 DIAGNOSIS — S81801D Unspecified open wound, right lower leg, subsequent encounter: Secondary | ICD-10-CM | POA: Diagnosis not present

## 2023-07-20 DIAGNOSIS — I872 Venous insufficiency (chronic) (peripheral): Secondary | ICD-10-CM | POA: Diagnosis not present

## 2023-07-20 DIAGNOSIS — S81819A Laceration without foreign body, unspecified lower leg, initial encounter: Secondary | ICD-10-CM | POA: Diagnosis not present

## 2023-07-20 DIAGNOSIS — R531 Weakness: Secondary | ICD-10-CM | POA: Diagnosis not present

## 2023-07-20 DIAGNOSIS — F319 Bipolar disorder, unspecified: Secondary | ICD-10-CM | POA: Diagnosis not present

## 2023-07-22 DIAGNOSIS — I872 Venous insufficiency (chronic) (peripheral): Secondary | ICD-10-CM | POA: Diagnosis not present

## 2023-07-22 DIAGNOSIS — I700 Atherosclerosis of aorta: Secondary | ICD-10-CM | POA: Diagnosis not present

## 2023-07-22 DIAGNOSIS — I110 Hypertensive heart disease with heart failure: Secondary | ICD-10-CM | POA: Diagnosis not present

## 2023-07-22 DIAGNOSIS — J449 Chronic obstructive pulmonary disease, unspecified: Secondary | ICD-10-CM | POA: Diagnosis not present

## 2023-07-22 DIAGNOSIS — L97213 Non-pressure chronic ulcer of right calf with necrosis of muscle: Secondary | ICD-10-CM | POA: Diagnosis not present

## 2023-07-22 DIAGNOSIS — I5032 Chronic diastolic (congestive) heart failure: Secondary | ICD-10-CM | POA: Diagnosis not present

## 2023-07-22 DIAGNOSIS — D649 Anemia, unspecified: Secondary | ICD-10-CM | POA: Diagnosis not present

## 2023-07-22 DIAGNOSIS — F319 Bipolar disorder, unspecified: Secondary | ICD-10-CM | POA: Diagnosis not present

## 2023-07-22 DIAGNOSIS — L97812 Non-pressure chronic ulcer of other part of right lower leg with fat layer exposed: Secondary | ICD-10-CM | POA: Diagnosis not present

## 2023-07-26 ENCOUNTER — Telehealth: Payer: Self-pay

## 2023-07-26 DIAGNOSIS — L97213 Non-pressure chronic ulcer of right calf with necrosis of muscle: Secondary | ICD-10-CM | POA: Diagnosis not present

## 2023-07-26 DIAGNOSIS — D649 Anemia, unspecified: Secondary | ICD-10-CM | POA: Diagnosis not present

## 2023-07-26 DIAGNOSIS — I872 Venous insufficiency (chronic) (peripheral): Secondary | ICD-10-CM | POA: Diagnosis not present

## 2023-07-26 DIAGNOSIS — I110 Hypertensive heart disease with heart failure: Secondary | ICD-10-CM | POA: Diagnosis not present

## 2023-07-26 DIAGNOSIS — F319 Bipolar disorder, unspecified: Secondary | ICD-10-CM | POA: Diagnosis not present

## 2023-07-26 DIAGNOSIS — L97812 Non-pressure chronic ulcer of other part of right lower leg with fat layer exposed: Secondary | ICD-10-CM | POA: Diagnosis not present

## 2023-07-26 DIAGNOSIS — I5032 Chronic diastolic (congestive) heart failure: Secondary | ICD-10-CM | POA: Diagnosis not present

## 2023-07-26 DIAGNOSIS — I700 Atherosclerosis of aorta: Secondary | ICD-10-CM | POA: Diagnosis not present

## 2023-07-26 DIAGNOSIS — J449 Chronic obstructive pulmonary disease, unspecified: Secondary | ICD-10-CM | POA: Diagnosis not present

## 2023-07-26 NOTE — Progress Notes (Signed)
 Complex Care Management Note  Care Guide Note 07/26/2023 Name: NISHIKA PARKHURST MRN: [id number] DOB: 26-Mar-1960  LIAM BOSSMAN is a 63 y.o. year old female who sees Trinidad Hun, MD for primary care. I reached out to [name] by phone today to offer complex care management services.  Ms. Simeone was given information about Complex Care Management services today including:   The Complex Care Management services include support from the care team which includes your Nurse Care Manager, Clinical Social Worker, or Pharmacist.  The Complex Care Management team is here to help remove barriers to the health concerns and goals most important to you. Complex Care Management services are voluntary, and the patient may decline or stop services at any time by request to their care team member.   Complex Care Management Consent Status: Patient agreed to services and verbal consent obtained.   Follow up plan:  Telephone appointment with complex care management team member scheduled for:  07/27/23 at 10:00 a.m.   Encounter Outcome:  Patient Scheduled  D[facility]  [facility], [facility] Care Management Assistant Direct Dial: [phone]  Fax: [phone]

## 2023-07-27 ENCOUNTER — Telehealth: Payer: Self-pay

## 2023-07-27 DIAGNOSIS — R269 Unspecified abnormalities of gait and mobility: Secondary | ICD-10-CM | POA: Diagnosis not present

## 2023-07-27 DIAGNOSIS — I872 Venous insufficiency (chronic) (peripheral): Secondary | ICD-10-CM | POA: Diagnosis not present

## 2023-07-27 DIAGNOSIS — L97812 Non-pressure chronic ulcer of other part of right lower leg with fat layer exposed: Secondary | ICD-10-CM | POA: Diagnosis not present

## 2023-07-27 DIAGNOSIS — F319 Bipolar disorder, unspecified: Secondary | ICD-10-CM | POA: Diagnosis not present

## 2023-07-27 DIAGNOSIS — I700 Atherosclerosis of aorta: Secondary | ICD-10-CM | POA: Diagnosis not present

## 2023-07-27 DIAGNOSIS — L97213 Non-pressure chronic ulcer of right calf with necrosis of muscle: Secondary | ICD-10-CM | POA: Diagnosis not present

## 2023-07-27 DIAGNOSIS — D649 Anemia, unspecified: Secondary | ICD-10-CM | POA: Diagnosis not present

## 2023-07-27 DIAGNOSIS — I5032 Chronic diastolic (congestive) heart failure: Secondary | ICD-10-CM | POA: Diagnosis not present

## 2023-07-27 DIAGNOSIS — Z79891 Long term (current) use of opiate analgesic: Secondary | ICD-10-CM | POA: Diagnosis not present

## 2023-07-27 DIAGNOSIS — J449 Chronic obstructive pulmonary disease, unspecified: Secondary | ICD-10-CM | POA: Diagnosis not present

## 2023-07-27 DIAGNOSIS — M47816 Spondylosis without myelopathy or radiculopathy, lumbar region: Secondary | ICD-10-CM | POA: Diagnosis not present

## 2023-07-27 DIAGNOSIS — I110 Hypertensive heart disease with heart failure: Secondary | ICD-10-CM | POA: Diagnosis not present

## 2023-07-29 DIAGNOSIS — Z6829 Body mass index (BMI) 29.0-29.9, adult: Secondary | ICD-10-CM | POA: Diagnosis not present

## 2023-07-29 DIAGNOSIS — S81812D Laceration without foreign body, left lower leg, subsequent encounter: Secondary | ICD-10-CM | POA: Diagnosis not present

## 2023-07-29 DIAGNOSIS — Z1331 Encounter for screening for depression: Secondary | ICD-10-CM | POA: Diagnosis not present

## 2023-07-30 DIAGNOSIS — I700 Atherosclerosis of aorta: Secondary | ICD-10-CM | POA: Diagnosis not present

## 2023-07-30 DIAGNOSIS — D649 Anemia, unspecified: Secondary | ICD-10-CM | POA: Diagnosis not present

## 2023-07-30 DIAGNOSIS — L97213 Non-pressure chronic ulcer of right calf with necrosis of muscle: Secondary | ICD-10-CM | POA: Diagnosis not present

## 2023-07-30 DIAGNOSIS — I872 Venous insufficiency (chronic) (peripheral): Secondary | ICD-10-CM | POA: Diagnosis not present

## 2023-07-30 DIAGNOSIS — L97812 Non-pressure chronic ulcer of other part of right lower leg with fat layer exposed: Secondary | ICD-10-CM | POA: Diagnosis not present

## 2023-07-30 DIAGNOSIS — I110 Hypertensive heart disease with heart failure: Secondary | ICD-10-CM | POA: Diagnosis not present

## 2023-07-30 DIAGNOSIS — F319 Bipolar disorder, unspecified: Secondary | ICD-10-CM | POA: Diagnosis not present

## 2023-07-30 DIAGNOSIS — I5032 Chronic diastolic (congestive) heart failure: Secondary | ICD-10-CM | POA: Diagnosis not present

## 2023-07-30 DIAGNOSIS — J449 Chronic obstructive pulmonary disease, unspecified: Secondary | ICD-10-CM | POA: Diagnosis not present

## 2023-08-03 DIAGNOSIS — L97213 Non-pressure chronic ulcer of right calf with necrosis of muscle: Secondary | ICD-10-CM | POA: Diagnosis not present

## 2023-08-03 DIAGNOSIS — D649 Anemia, unspecified: Secondary | ICD-10-CM | POA: Diagnosis not present

## 2023-08-03 DIAGNOSIS — E785 Hyperlipidemia, unspecified: Secondary | ICD-10-CM | POA: Diagnosis not present

## 2023-08-03 DIAGNOSIS — I700 Atherosclerosis of aorta: Secondary | ICD-10-CM | POA: Diagnosis not present

## 2023-08-03 DIAGNOSIS — I872 Venous insufficiency (chronic) (peripheral): Secondary | ICD-10-CM | POA: Diagnosis not present

## 2023-08-03 DIAGNOSIS — L97812 Non-pressure chronic ulcer of other part of right lower leg with fat layer exposed: Secondary | ICD-10-CM | POA: Diagnosis not present

## 2023-08-03 DIAGNOSIS — I110 Hypertensive heart disease with heart failure: Secondary | ICD-10-CM | POA: Diagnosis not present

## 2023-08-03 DIAGNOSIS — I5032 Chronic diastolic (congestive) heart failure: Secondary | ICD-10-CM | POA: Diagnosis not present

## 2023-08-03 DIAGNOSIS — J449 Chronic obstructive pulmonary disease, unspecified: Secondary | ICD-10-CM | POA: Diagnosis not present

## 2023-08-03 DIAGNOSIS — F319 Bipolar disorder, unspecified: Secondary | ICD-10-CM | POA: Diagnosis not present

## 2023-08-04 DIAGNOSIS — J449 Chronic obstructive pulmonary disease, unspecified: Secondary | ICD-10-CM | POA: Diagnosis not present

## 2023-08-04 DIAGNOSIS — I5032 Chronic diastolic (congestive) heart failure: Secondary | ICD-10-CM | POA: Diagnosis not present

## 2023-08-04 DIAGNOSIS — R109 Unspecified abdominal pain: Secondary | ICD-10-CM | POA: Diagnosis not present

## 2023-08-04 DIAGNOSIS — D649 Anemia, unspecified: Secondary | ICD-10-CM | POA: Diagnosis not present

## 2023-08-04 DIAGNOSIS — R82998 Other abnormal findings in urine: Secondary | ICD-10-CM | POA: Diagnosis not present

## 2023-08-04 DIAGNOSIS — F319 Bipolar disorder, unspecified: Secondary | ICD-10-CM | POA: Diagnosis not present

## 2023-08-04 DIAGNOSIS — I872 Venous insufficiency (chronic) (peripheral): Secondary | ICD-10-CM | POA: Diagnosis not present

## 2023-08-04 DIAGNOSIS — I110 Hypertensive heart disease with heart failure: Secondary | ICD-10-CM | POA: Diagnosis not present

## 2023-08-04 DIAGNOSIS — I700 Atherosclerosis of aorta: Secondary | ICD-10-CM | POA: Diagnosis not present

## 2023-08-04 DIAGNOSIS — L97812 Non-pressure chronic ulcer of other part of right lower leg with fat layer exposed: Secondary | ICD-10-CM | POA: Diagnosis not present

## 2023-08-04 DIAGNOSIS — Z6831 Body mass index (BMI) 31.0-31.9, adult: Secondary | ICD-10-CM | POA: Diagnosis not present

## 2023-08-04 DIAGNOSIS — L97213 Non-pressure chronic ulcer of right calf with necrosis of muscle: Secondary | ICD-10-CM | POA: Diagnosis not present

## 2023-08-04 DIAGNOSIS — N390 Urinary tract infection, site not specified: Secondary | ICD-10-CM | POA: Diagnosis not present

## 2023-08-05 DIAGNOSIS — J9611 Chronic respiratory failure with hypoxia: Secondary | ICD-10-CM | POA: Diagnosis not present

## 2023-08-05 DIAGNOSIS — E871 Hypo-osmolality and hyponatremia: Secondary | ICD-10-CM | POA: Diagnosis not present

## 2023-08-05 DIAGNOSIS — I361 Nonrheumatic tricuspid (valve) insufficiency: Secondary | ICD-10-CM | POA: Diagnosis not present

## 2023-08-05 DIAGNOSIS — D649 Anemia, unspecified: Secondary | ICD-10-CM | POA: Diagnosis not present

## 2023-08-05 DIAGNOSIS — I2510 Atherosclerotic heart disease of native coronary artery without angina pectoris: Secondary | ICD-10-CM | POA: Diagnosis not present

## 2023-08-05 DIAGNOSIS — L97919 Non-pressure chronic ulcer of unspecified part of right lower leg with unspecified severity: Secondary | ICD-10-CM | POA: Diagnosis not present

## 2023-08-05 DIAGNOSIS — J449 Chronic obstructive pulmonary disease, unspecified: Secondary | ICD-10-CM | POA: Diagnosis not present

## 2023-08-05 DIAGNOSIS — F319 Bipolar disorder, unspecified: Secondary | ICD-10-CM | POA: Diagnosis not present

## 2023-08-05 DIAGNOSIS — N179 Acute kidney failure, unspecified: Secondary | ICD-10-CM | POA: Diagnosis not present

## 2023-08-05 DIAGNOSIS — I110 Hypertensive heart disease with heart failure: Secondary | ICD-10-CM | POA: Diagnosis not present

## 2023-08-05 DIAGNOSIS — I10 Essential (primary) hypertension: Secondary | ICD-10-CM | POA: Diagnosis not present

## 2023-08-05 DIAGNOSIS — I5032 Chronic diastolic (congestive) heart failure: Secondary | ICD-10-CM | POA: Diagnosis not present

## 2023-08-05 DIAGNOSIS — L97929 Non-pressure chronic ulcer of unspecified part of left lower leg with unspecified severity: Secondary | ICD-10-CM | POA: Diagnosis not present

## 2023-08-05 DIAGNOSIS — I872 Venous insufficiency (chronic) (peripheral): Secondary | ICD-10-CM | POA: Diagnosis not present

## 2023-08-05 DIAGNOSIS — K5900 Constipation, unspecified: Secondary | ICD-10-CM | POA: Diagnosis not present

## 2023-08-05 DIAGNOSIS — I700 Atherosclerosis of aorta: Secondary | ICD-10-CM | POA: Diagnosis not present

## 2023-08-05 DIAGNOSIS — L97812 Non-pressure chronic ulcer of other part of right lower leg with fat layer exposed: Secondary | ICD-10-CM | POA: Diagnosis not present

## 2023-08-05 DIAGNOSIS — L97213 Non-pressure chronic ulcer of right calf with necrosis of muscle: Secondary | ICD-10-CM | POA: Diagnosis not present

## 2023-08-06 DIAGNOSIS — I361 Nonrheumatic tricuspid (valve) insufficiency: Secondary | ICD-10-CM | POA: Diagnosis not present

## 2023-08-10 DIAGNOSIS — Z789 Other specified health status: Secondary | ICD-10-CM | POA: Diagnosis not present

## 2023-08-10 DIAGNOSIS — I110 Hypertensive heart disease with heart failure: Secondary | ICD-10-CM | POA: Diagnosis not present

## 2023-08-10 DIAGNOSIS — D649 Anemia, unspecified: Secondary | ICD-10-CM | POA: Diagnosis not present

## 2023-08-10 DIAGNOSIS — E876 Hypokalemia: Secondary | ICD-10-CM | POA: Diagnosis not present

## 2023-08-12 ENCOUNTER — Telehealth: Payer: Self-pay

## 2023-08-12 DIAGNOSIS — I700 Atherosclerosis of aorta: Secondary | ICD-10-CM | POA: Diagnosis not present

## 2023-08-12 DIAGNOSIS — F319 Bipolar disorder, unspecified: Secondary | ICD-10-CM | POA: Diagnosis not present

## 2023-08-12 DIAGNOSIS — L97213 Non-pressure chronic ulcer of right calf with necrosis of muscle: Secondary | ICD-10-CM | POA: Diagnosis not present

## 2023-08-12 DIAGNOSIS — I110 Hypertensive heart disease with heart failure: Secondary | ICD-10-CM | POA: Diagnosis not present

## 2023-08-12 DIAGNOSIS — D649 Anemia, unspecified: Secondary | ICD-10-CM | POA: Diagnosis not present

## 2023-08-12 DIAGNOSIS — I872 Venous insufficiency (chronic) (peripheral): Secondary | ICD-10-CM | POA: Diagnosis not present

## 2023-08-12 DIAGNOSIS — J449 Chronic obstructive pulmonary disease, unspecified: Secondary | ICD-10-CM | POA: Diagnosis not present

## 2023-08-12 DIAGNOSIS — L97812 Non-pressure chronic ulcer of other part of right lower leg with fat layer exposed: Secondary | ICD-10-CM | POA: Diagnosis not present

## 2023-08-12 DIAGNOSIS — I5032 Chronic diastolic (congestive) heart failure: Secondary | ICD-10-CM | POA: Diagnosis not present

## 2023-08-12 NOTE — Patient Outreach (Signed)
 LCSW received a return call from patient from the call on 07/27/2023. LCSW explained that the call was being made about a referral that was received. Patient was still interested in receiving services therefore the appointment was scheduled for 08/19/2023 at 10:00 am.  Olam Ally, MSW, LCSW [place]  [facility], [facility] Licensed Clinical Social Worker Direct Dial: [phone]

## 2023-08-16 DIAGNOSIS — N179 Acute kidney failure, unspecified: Secondary | ICD-10-CM | POA: Diagnosis not present

## 2023-08-16 DIAGNOSIS — I872 Venous insufficiency (chronic) (peripheral): Secondary | ICD-10-CM | POA: Diagnosis not present

## 2023-08-16 DIAGNOSIS — L97213 Non-pressure chronic ulcer of right calf with necrosis of muscle: Secondary | ICD-10-CM | POA: Diagnosis not present

## 2023-08-16 DIAGNOSIS — I5032 Chronic diastolic (congestive) heart failure: Secondary | ICD-10-CM | POA: Diagnosis not present

## 2023-08-16 DIAGNOSIS — L97812 Non-pressure chronic ulcer of other part of right lower leg with fat layer exposed: Secondary | ICD-10-CM | POA: Diagnosis not present

## 2023-08-16 DIAGNOSIS — I110 Hypertensive heart disease with heart failure: Secondary | ICD-10-CM | POA: Diagnosis not present

## 2023-08-16 DIAGNOSIS — I700 Atherosclerosis of aorta: Secondary | ICD-10-CM | POA: Diagnosis not present

## 2023-08-16 DIAGNOSIS — F319 Bipolar disorder, unspecified: Secondary | ICD-10-CM | POA: Diagnosis not present

## 2023-08-16 DIAGNOSIS — I959 Hypotension, unspecified: Secondary | ICD-10-CM | POA: Diagnosis not present

## 2023-08-17 DIAGNOSIS — F319 Bipolar disorder, unspecified: Secondary | ICD-10-CM | POA: Diagnosis not present

## 2023-08-17 DIAGNOSIS — L97213 Non-pressure chronic ulcer of right calf with necrosis of muscle: Secondary | ICD-10-CM | POA: Diagnosis not present

## 2023-08-17 DIAGNOSIS — I872 Venous insufficiency (chronic) (peripheral): Secondary | ICD-10-CM | POA: Diagnosis not present

## 2023-08-17 DIAGNOSIS — E876 Hypokalemia: Secondary | ICD-10-CM | POA: Diagnosis not present

## 2023-08-17 DIAGNOSIS — I959 Hypotension, unspecified: Secondary | ICD-10-CM | POA: Diagnosis not present

## 2023-08-17 DIAGNOSIS — I110 Hypertensive heart disease with heart failure: Secondary | ICD-10-CM | POA: Diagnosis not present

## 2023-08-17 DIAGNOSIS — N179 Acute kidney failure, unspecified: Secondary | ICD-10-CM | POA: Diagnosis not present

## 2023-08-17 DIAGNOSIS — L97812 Non-pressure chronic ulcer of other part of right lower leg with fat layer exposed: Secondary | ICD-10-CM | POA: Diagnosis not present

## 2023-08-17 DIAGNOSIS — I5032 Chronic diastolic (congestive) heart failure: Secondary | ICD-10-CM | POA: Diagnosis not present

## 2023-08-17 DIAGNOSIS — M47816 Spondylosis without myelopathy or radiculopathy, lumbar region: Secondary | ICD-10-CM | POA: Diagnosis not present

## 2023-08-17 DIAGNOSIS — D649 Anemia, unspecified: Secondary | ICD-10-CM | POA: Diagnosis not present

## 2023-08-17 DIAGNOSIS — I700 Atherosclerosis of aorta: Secondary | ICD-10-CM | POA: Diagnosis not present

## 2023-08-18 ENCOUNTER — Encounter: Payer: Self-pay | Admitting: Internal Medicine

## 2023-08-18 DIAGNOSIS — I872 Venous insufficiency (chronic) (peripheral): Secondary | ICD-10-CM | POA: Diagnosis not present

## 2023-08-18 DIAGNOSIS — I959 Hypotension, unspecified: Secondary | ICD-10-CM | POA: Diagnosis not present

## 2023-08-18 DIAGNOSIS — F319 Bipolar disorder, unspecified: Secondary | ICD-10-CM | POA: Diagnosis not present

## 2023-08-18 DIAGNOSIS — I110 Hypertensive heart disease with heart failure: Secondary | ICD-10-CM | POA: Diagnosis not present

## 2023-08-18 DIAGNOSIS — L97213 Non-pressure chronic ulcer of right calf with necrosis of muscle: Secondary | ICD-10-CM | POA: Diagnosis not present

## 2023-08-18 DIAGNOSIS — I700 Atherosclerosis of aorta: Secondary | ICD-10-CM | POA: Diagnosis not present

## 2023-08-18 DIAGNOSIS — I5032 Chronic diastolic (congestive) heart failure: Secondary | ICD-10-CM | POA: Diagnosis not present

## 2023-08-18 DIAGNOSIS — N179 Acute kidney failure, unspecified: Secondary | ICD-10-CM | POA: Diagnosis not present

## 2023-08-18 DIAGNOSIS — L97912 Non-pressure chronic ulcer of unspecified part of right lower leg with fat layer exposed: Secondary | ICD-10-CM | POA: Diagnosis not present

## 2023-08-18 DIAGNOSIS — L97812 Non-pressure chronic ulcer of other part of right lower leg with fat layer exposed: Secondary | ICD-10-CM | POA: Diagnosis not present

## 2023-08-19 ENCOUNTER — Other Ambulatory Visit: Payer: Self-pay

## 2023-08-19 DIAGNOSIS — J441 Chronic obstructive pulmonary disease with (acute) exacerbation: Secondary | ICD-10-CM

## 2023-08-19 NOTE — Patient Outreach (Signed)
 Complex Care Management   Visit Note  08/19/2023  Name:  Tracy Oconnor MRN: [id number] DOB: 06-24-1960  Situation: Referral received for Complex Care Management related to SDOH Barriers:  Transportation Food insecurity I obtained verbal consent from Patient.  Visit completed with patient  on the phone  Background:   Past Medical History:  Diagnosis Date   Aortic atherosclerosis (HCC) 07/29/2022   Atherosclerosis    Bilateral carpal tunnel syndrome 01/30/2021   Bipolar disorder (HCC)    CAD (coronary artery disease)    Cervical radiculopathy 01/30/2021   COPD (chronic obstructive pulmonary disease) (HCC)    Coronary artery disease 07/29/2022   DOE (dyspnea on exertion) 07/29/2022   Edema    Elevated lipoprotein A level    2024   Enteritis 12/17/2020   Fatigue    Hyperlipidemia    Hypertension    Intermittent chest pain    Mitral regurgitation    Murmur    Obesity (BMI 30.0-34.9) 07/29/2022   OSA (obstructive sleep apnea)    Palpitations     Assessment: Patient Reported Symptoms:  Cognitive Cognitive Status: Alert and oriented to person, place, and time, Normal speech and language skills      Neurological Neurological Review of Symptoms: No symptoms reported    HEENT HEENT Symptoms Reported: No symptoms reported      Cardiovascular Cardiovascular Symptoms Reported: No symptoms reported (Patient reports having palpitations in the past) Does patient have uncontrolled Hypertension?: No    Respiratory Respiratory Symptoms Reported: Shortness of breath Other Respiratory Symptoms: Patient reports having COPD Respiratory Management Strategies: Medication therapy, Adequate rest  Endocrine Endocrine Symptoms Reported: No symptoms reported Is patient diabetic?: No    Gastrointestinal Gastrointestinal Symptoms Reported: No symptoms reported      Genitourinary Genitourinary Symptoms Reported: Other Other Genitourinary Symptoms: Patient reports a recent hospitla stay  for not being able urinate Additional Genitourinary Details: Patient reports that concern was resolved    Integumentary Integumentary Symptoms Reported: Other Other Integumentary Symptoms: patient reports having ulcers on legs Additional Integumentary Details: Patient reports being seen by wound care Skin Management Strategies: Medication therapy, Adequate rest  Musculoskeletal Musculoskelatal Symptoms Reviewed: Difficulty walking Additional Musculoskeletal Details: Patient reports having arthrits Musculoskeletal Management Strategies: Medical device, Medication therapy, Adequate rest Musculoskeletal Self-Management Outcome: 3 (uncertain) Musculoskeletal Comment: Patient reports using walker to prevent falls Falls in the past year?: Yes Number of falls in past year: 2 or more Was there an injury with Fall?: Yes Fall Risk Category Calculator: 3 Patient Fall Risk Level: High Fall Risk Patient at Risk for Falls Due to: Impaired balance/gait Fall risk Follow up: Falls evaluation completed  Psychosocial Psychosocial Symptoms Reported: No symptoms reported Additional Psychological Details: Pateint screened negative for depression and mininumal for anxiety     Quality of Family Relationships: stressful, non-existent (Patient reports not speaking with son-in law anymore) Do you feel physically threatened by others?: No      08/19/2023   10:33 AM  Depression screen PHQ 2/9  Decreased Interest 1  Down, Depressed, Hopeless 0  PHQ - 2 Score 1    There were no vitals filed for this visit.  Medications Reviewed Today   Medications were not reviewed in this encounter     Recommendation:   Continue Current Plan of Care  Follow Up Plan:   Telephone follow-up 09/03/2023  Olam Ally, MSW, LCSW [place]  [facility], [facility] Licensed Clinical Social Worker Direct Dial: [phone]

## 2023-08-19 NOTE — Patient Instructions (Signed)
 Visit Information  Thank you for taking time to visit with me today. Please don't hesitate to contact me if I can be of assistance to you before our next scheduled appointment.  Our next appointment is by telephone on 09/03/2023 at 10:30 am Please call the care guide team at [phone] if you need to cancel or reschedule your appointment.   Following is a copy of your care plan:   Goals Addressed             This Visit's Progress    VBCI Social Work Care Plan: LCSW       Problems:   Geophysicist/field seismologist  CSW Clinical Goal(s):   Over the next 90 days the Patient will work with Child psychotherapist to address concerns related to SDOH needs.  Interventions:  Social Determinants of Health in Patient with COPD: SDOH assessments completed: Programme researcher, broadcasting/film/video of current treatment plan related to unmet needs SDOH screenings completed Patient is requesting assistance with securing scooter LCSW scheduled visit with BSW for 08/24/2023 at 11:00 am LCSW complete PHQ2 which was negative LCSW complete GAD 7 which was minimal for anxiety. Patient reports not wanting treatment for mental health Patient has requested Advance Directive Paperwork.   Patient Goals/Self-Care Activities:  Follow up with community resources when provided.  Plan:   Telephone follow up appointment with care management team member scheduled for:  09/03/2023 at 10:30 am        Please call 911 if you are experiencing a Mental Health or Behavioral Health Crisis or need someone to talk to.  Patient verbalizes understanding of instructions and care plan provided today and agrees to view in MyChart. Active MyChart status and patient understanding of how to access instructions and care plan via MyChart confirmed with patient.     Tracy Oconnor, MSW, LCSW [place]  [facility], [facility] Health Licensed Clinical Social Worker Direct Dial: [phone]

## 2023-08-23 DIAGNOSIS — I700 Atherosclerosis of aorta: Secondary | ICD-10-CM | POA: Diagnosis not present

## 2023-08-23 DIAGNOSIS — N179 Acute kidney failure, unspecified: Secondary | ICD-10-CM | POA: Diagnosis not present

## 2023-08-23 DIAGNOSIS — L97812 Non-pressure chronic ulcer of other part of right lower leg with fat layer exposed: Secondary | ICD-10-CM | POA: Diagnosis not present

## 2023-08-23 DIAGNOSIS — I5032 Chronic diastolic (congestive) heart failure: Secondary | ICD-10-CM | POA: Diagnosis not present

## 2023-08-23 DIAGNOSIS — I959 Hypotension, unspecified: Secondary | ICD-10-CM | POA: Diagnosis not present

## 2023-08-23 DIAGNOSIS — I110 Hypertensive heart disease with heart failure: Secondary | ICD-10-CM | POA: Diagnosis not present

## 2023-08-23 DIAGNOSIS — F319 Bipolar disorder, unspecified: Secondary | ICD-10-CM | POA: Diagnosis not present

## 2023-08-23 DIAGNOSIS — L97213 Non-pressure chronic ulcer of right calf with necrosis of muscle: Secondary | ICD-10-CM | POA: Diagnosis not present

## 2023-08-23 DIAGNOSIS — I872 Venous insufficiency (chronic) (peripheral): Secondary | ICD-10-CM | POA: Diagnosis not present

## 2023-08-24 ENCOUNTER — Other Ambulatory Visit: Payer: Self-pay

## 2023-08-24 DIAGNOSIS — I700 Atherosclerosis of aorta: Secondary | ICD-10-CM | POA: Diagnosis not present

## 2023-08-24 DIAGNOSIS — L97213 Non-pressure chronic ulcer of right calf with necrosis of muscle: Secondary | ICD-10-CM | POA: Diagnosis not present

## 2023-08-24 DIAGNOSIS — I872 Venous insufficiency (chronic) (peripheral): Secondary | ICD-10-CM | POA: Diagnosis not present

## 2023-08-24 DIAGNOSIS — L97812 Non-pressure chronic ulcer of other part of right lower leg with fat layer exposed: Secondary | ICD-10-CM | POA: Diagnosis not present

## 2023-08-24 DIAGNOSIS — I5032 Chronic diastolic (congestive) heart failure: Secondary | ICD-10-CM | POA: Diagnosis not present

## 2023-08-24 DIAGNOSIS — N179 Acute kidney failure, unspecified: Secondary | ICD-10-CM | POA: Diagnosis not present

## 2023-08-24 DIAGNOSIS — I959 Hypotension, unspecified: Secondary | ICD-10-CM | POA: Diagnosis not present

## 2023-08-24 DIAGNOSIS — I110 Hypertensive heart disease with heart failure: Secondary | ICD-10-CM | POA: Diagnosis not present

## 2023-08-24 DIAGNOSIS — F319 Bipolar disorder, unspecified: Secondary | ICD-10-CM | POA: Diagnosis not present

## 2023-08-24 NOTE — Patient Instructions (Signed)
 Visit Information  Thank you for taking time to visit with me today. Please don't hesitate to contact me if I can be of assistance to you before our next scheduled appointment.  Our next appointment is by telephone on 09/07/2023 at 11AM Please call the care guide team at [phone] if you need to cancel or reschedule your appointment.   Following is a copy of your care plan:   Goals Addressed             This Visit's Progress    BSW VBCI Social Work Care Plan       Problems:   Geophysicist/field seismologist , Transportation, and need for motorized scooter.  CSW Clinical Goal(s):   Over the next 2 weeks the Patient will work with Child psychotherapist to address concerns related to food insecurity, transportation, and need for motorized scooter. .  Interventions:  BSW will provide patient information on any available transportation resources outside of medical transportation and make efforts to help patient obtain motorized scooter. *patient declined additional information for food*  Patient Goals/Self-Care Activities:  None  Plan:   Telephone follow up appointment with care management team member scheduled for:  09/07/2023 at 11AM.        Please call the Suicide and Crisis Lifeline: 988 go to [facility] [address] [place] [phone]) call 911 if you are experiencing a Mental Health or Behavioral Health Crisis or need someone to talk to.  Patient verbalizes understanding of instructions and care plan provided today and agrees to view in MyChart. Active MyChart status and patient understanding of how to access instructions and care plan via MyChart confirmed with patient.     Laymon Doll, BSW [place]/VBCI - [employer] Social Worker [phone]

## 2023-08-24 NOTE — Patient Outreach (Signed)
 Complex Care Management   Visit Note  08/24/2023  Name:  Tracy Oconnor MRN: [id number] DOB: Jun 22, 1960  Situation: Referral received for Complex Care Management related to SDOH Barriers:  Transportation Food insecurity Need for motorized scooter I obtained verbal consent from Patient.  Visit completed with patient  on the phone  Background:   Past Medical History:  Diagnosis Date   Aortic atherosclerosis (HCC) 07/29/2022   Atherosclerosis    Bilateral carpal tunnel syndrome 01/30/2021   Bipolar disorder (HCC)    CAD (coronary artery disease)    Cervical radiculopathy 01/30/2021   COPD (chronic obstructive pulmonary disease) (HCC)    Coronary artery disease 07/29/2022   DOE (dyspnea on exertion) 07/29/2022   Edema    Elevated lipoprotein A level    2024   Enteritis 12/17/2020   Fatigue    Hyperlipidemia    Hypertension    Intermittent chest pain    Mitral regurgitation    Murmur    Obesity (BMI 30.0-34.9) 07/29/2022   OSA (obstructive sleep apnea)    Palpitations     Assessment: BSW held initial call with patient over the phone. Patient was alert and cognitive. SDOH needs were recently assessed by LCSW Tracy Oconnor. Needs identified were: food insecurity, transportation, and need for motorized scooter. Patient confirmed needs. BSW offered patient food resources, but patient declined and stated she already had the food pantries info available to her. Patient confirmed she has a case Production designer, theatre/television/film at [facility], Tracy Oconnor [phone] ext. [phone]. Patient states CM has tried to obtain scooter in the past and [employer] has provided in-network providers but has not received one due to out-of-network coverage. Patient confirmed she uses a walker with wheels at the moment, but wishes to have a scooter for easier mobility. BSW will reach out to CM Tracy Oconnor to obtain context to previous efforts and coordinate work together. Patient understood and agreed. Patient was provided BSW direct  phone # should she need additional information to other resources. No other resources provided to patient at this time.   SDOH Interventions    Flowsheet Row Patient Outreach from 08/19/2023 in [place] POPULATION HEALTH DEPARTMENT  SDOH Interventions   Food Insecurity Interventions --  [Will schedule visit with BSW]  Housing Interventions Intervention Not Indicated  Transportation Interventions Other (Comment)  [Will schedule with BSW]  Utilities Interventions Intervention Not Indicated    Recommendation:   None  Follow Up Plan:   Telephone follow up appointment date/time:  09/06/2024 at 11AM.  Laymon Doll, BSW [place]/VBCI - [facility] Social Worker [phone]

## 2023-08-25 ENCOUNTER — Ambulatory Visit: Admitting: Cardiology

## 2023-08-30 DIAGNOSIS — Z79891 Long term (current) use of opiate analgesic: Secondary | ICD-10-CM | POA: Diagnosis not present

## 2023-08-30 DIAGNOSIS — R269 Unspecified abnormalities of gait and mobility: Secondary | ICD-10-CM | POA: Diagnosis not present

## 2023-08-30 DIAGNOSIS — M47816 Spondylosis without myelopathy or radiculopathy, lumbar region: Secondary | ICD-10-CM | POA: Diagnosis not present

## 2023-08-30 DIAGNOSIS — N179 Acute kidney failure, unspecified: Secondary | ICD-10-CM | POA: Diagnosis not present

## 2023-08-30 DIAGNOSIS — F319 Bipolar disorder, unspecified: Secondary | ICD-10-CM | POA: Diagnosis not present

## 2023-08-30 DIAGNOSIS — I110 Hypertensive heart disease with heart failure: Secondary | ICD-10-CM | POA: Diagnosis not present

## 2023-08-30 DIAGNOSIS — L97213 Non-pressure chronic ulcer of right calf with necrosis of muscle: Secondary | ICD-10-CM | POA: Diagnosis not present

## 2023-08-30 DIAGNOSIS — I959 Hypotension, unspecified: Secondary | ICD-10-CM | POA: Diagnosis not present

## 2023-08-30 DIAGNOSIS — L97812 Non-pressure chronic ulcer of other part of right lower leg with fat layer exposed: Secondary | ICD-10-CM | POA: Diagnosis not present

## 2023-08-30 DIAGNOSIS — I700 Atherosclerosis of aorta: Secondary | ICD-10-CM | POA: Diagnosis not present

## 2023-08-30 DIAGNOSIS — I5032 Chronic diastolic (congestive) heart failure: Secondary | ICD-10-CM | POA: Diagnosis not present

## 2023-08-30 DIAGNOSIS — M479 Spondylosis, unspecified: Secondary | ICD-10-CM | POA: Diagnosis not present

## 2023-08-30 DIAGNOSIS — I872 Venous insufficiency (chronic) (peripheral): Secondary | ICD-10-CM | POA: Diagnosis not present

## 2023-08-31 DIAGNOSIS — F319 Bipolar disorder, unspecified: Secondary | ICD-10-CM | POA: Diagnosis not present

## 2023-08-31 DIAGNOSIS — I5032 Chronic diastolic (congestive) heart failure: Secondary | ICD-10-CM | POA: Diagnosis not present

## 2023-08-31 DIAGNOSIS — I872 Venous insufficiency (chronic) (peripheral): Secondary | ICD-10-CM | POA: Diagnosis not present

## 2023-08-31 DIAGNOSIS — N179 Acute kidney failure, unspecified: Secondary | ICD-10-CM | POA: Diagnosis not present

## 2023-08-31 DIAGNOSIS — L97213 Non-pressure chronic ulcer of right calf with necrosis of muscle: Secondary | ICD-10-CM | POA: Diagnosis not present

## 2023-08-31 DIAGNOSIS — Z87891 Personal history of nicotine dependence: Secondary | ICD-10-CM | POA: Diagnosis not present

## 2023-08-31 DIAGNOSIS — G4736 Sleep related hypoventilation in conditions classified elsewhere: Secondary | ICD-10-CM | POA: Diagnosis not present

## 2023-08-31 DIAGNOSIS — I959 Hypotension, unspecified: Secondary | ICD-10-CM | POA: Diagnosis not present

## 2023-08-31 DIAGNOSIS — J9611 Chronic respiratory failure with hypoxia: Secondary | ICD-10-CM | POA: Diagnosis not present

## 2023-08-31 DIAGNOSIS — J449 Chronic obstructive pulmonary disease, unspecified: Secondary | ICD-10-CM | POA: Diagnosis not present

## 2023-08-31 DIAGNOSIS — L97812 Non-pressure chronic ulcer of other part of right lower leg with fat layer exposed: Secondary | ICD-10-CM | POA: Diagnosis not present

## 2023-08-31 DIAGNOSIS — I110 Hypertensive heart disease with heart failure: Secondary | ICD-10-CM | POA: Diagnosis not present

## 2023-08-31 DIAGNOSIS — R053 Chronic cough: Secondary | ICD-10-CM | POA: Diagnosis not present

## 2023-08-31 DIAGNOSIS — I700 Atherosclerosis of aorta: Secondary | ICD-10-CM | POA: Diagnosis not present

## 2023-09-03 ENCOUNTER — Other Ambulatory Visit: Payer: Self-pay

## 2023-09-03 DIAGNOSIS — E785 Hyperlipidemia, unspecified: Secondary | ICD-10-CM | POA: Diagnosis not present

## 2023-09-03 DIAGNOSIS — J449 Chronic obstructive pulmonary disease, unspecified: Secondary | ICD-10-CM | POA: Diagnosis not present

## 2023-09-03 NOTE — Patient Outreach (Signed)
 Complex Care Management   Visit Note  09/03/2023  Name:  Tracy Oconnor MRN: [id number] DOB: Jul 01, 1960  Situation: Referral received for Complex Care Management related to SDOH Barriers:  Transportation Food insecurity I obtained verbal consent from Patient.  Visit completed with patient  on the phone.  Background:   Past Medical History:  Diagnosis Date   Aortic atherosclerosis (HCC) 07/29/2022   Atherosclerosis    Bilateral carpal tunnel syndrome 01/30/2021   Bipolar disorder (HCC)    CAD (coronary artery disease)    Cervical radiculopathy 01/30/2021   COPD (chronic obstructive pulmonary disease) (HCC)    Coronary artery disease 07/29/2022   DOE (dyspnea on exertion) 07/29/2022   Edema    Elevated lipoprotein A level    2024   Enteritis 12/17/2020   Fatigue    Hyperlipidemia    Hypertension    Intermittent chest pain    Mitral regurgitation    Murmur    Obesity (BMI 30.0-34.9) 07/29/2022   OSA (obstructive sleep apnea)    Palpitations     Assessment: Patient Reported Symptoms:  Cognitive Cognitive Status: Normal speech and language skills, Alert and oriented to person, place, and time Cognitive/Intellectual Conditions Management [RPT]: None reported or documented in medical history or problem list      Neurological Neurological Review of Symptoms: Not assessed    HEENT HEENT Symptoms Reported: Not assessed      Cardiovascular Cardiovascular Symptoms Reported: Not assessed    Respiratory Respiratory Symptoms Reported: Not assesed    Endocrine Endocrine Symptoms Reported: Not assessed    Gastrointestinal Gastrointestinal Symptoms Reported: Not assessed      Genitourinary Genitourinary Symptoms Reported: Not assessed    Integumentary Integumentary Symptoms Reported: Not assessed    Musculoskeletal Musculoskelatal Symptoms Reviewed: Not assessed        Psychosocial Psychosocial Symptoms Reported: No symptoms reported            08/19/2023   10:33  AM  Depression screen PHQ 2/9  Decreased Interest 1  Down, Depressed, Hopeless 0  PHQ - 2 Score 1    There were no vitals filed for this visit.  Medications Reviewed Today     Reviewed by Sherren Olam FORBES KEN (Social Worker) on 09/03/23 at 1040  Med List Status: <None>   Medication Order Taking? Sig Documenting Provider Last Dose Status Informant  albuterol  (VENTOLIN  HFA) 108 (90 Base) MCG/ACT inhaler [id number]  Inhale 2 puffs into the lungs every 4 (four) hours as needed for shortness of breath or wheezing. [provider]  Active Self, Pharmacy Records  ARIPiprazole  (ABILIFY ) 15 MG tablet [id number]  Take 15 mg by mouth daily. [provider]  Active Self, Pharmacy Records  aspirin  EC 81 MG tablet [id number]  Take 1 tablet (81 mg total) by mouth daily. Swallow whole. Revankar, Jennifer SAUNDERS, MD  Active Self, Pharmacy Records  atorvastatin  (LIPITOR) 40 MG tablet [id number] No Take 1 tablet (40 mg total) by mouth at bedtime. Carlin Delon BROCKS, NP Taking Expired 07/20/23 2359 Self, Pharmacy Records  Calcium  Carb-Cholecalciferol (OYSTER SHELL CALCIUM  250+D) 250-3.125 MG-MCG TABS [id number]  Take 1 tablet by mouth daily. [provider]  Active Self, Pharmacy Records  Cholecalciferol (VITAMIN D-3) 125 MCG (5000 UT) TABS [id number]  Take 5,000 Units by mouth daily. [provider]  Active Self, Pharmacy Records  citalopram  (CELEXA ) 40 MG tablet [id number]  Take 40 mg by mouth daily. [provider]  Active Self, Pharmacy Records  clotrimazole (LOTRIMIN) 1 % cream [id number]  Apply topically 2 (two) times daily. [provider]  Active Self, Pharmacy Records  diclofenac (VOLTAREN) 75 MG EC tablet [id number]  Take 75 mg by mouth 2 (two) times daily. [provider]  Active Self, Pharmacy Records  fluticasone [facility]) 50 MCG/ACT nasal spray [id number]  Place 2 sprays into both nostrils 2 (two) times daily. [provider]  Active Self,  Pharmacy Records  gabapentin  (NEURONTIN ) 400 MG capsule [id number]  Take 400 mg by mouth 3 (three) times daily. [provider]  Active Self, Pharmacy Records  HYDROcodone -acetaminophen  [facility]) 10-325 MG tablet [id number]  Take 1 tablet by mouth 3 (three) times daily as needed for moderate pain (pain score 4-6) or severe pain (pain score 7-10). [provider]  Active   ipratropium-albuterol  (DUONEB) 0.5-2.5 (3) MG/3ML SOLN [id number]  SMARTSIG:1 Vial(s) Every 6 Hours [provider]  Active Self, Pharmacy Records  isosorbide  mononitrate (IMDUR ) 30 MG 24 hr tablet [id number]  Take 0.5 tablets (15 mg total) by mouth daily. Carlin Delon BROCKS, NP  Active Self, Pharmacy Records  lidocaine  (LIDODERM ) 5 % [id number]  Place 1 patch onto the skin daily. [provider]  Active Self, Pharmacy Records  losartan  (COZAAR ) 25 MG tablet [id number]  Take 25 mg by mouth daily. [provider]  Active Self, Pharmacy Records  methocarbamol  (ROBAXIN ) 500 MG tablet [id number]  Take 500 mg by mouth 2 (two) times daily. [provider]  Active Self, Pharmacy Records  Multiple Vitamin (MULTIVITAMIN ADULT PO) [id number]  Take 1 tablet by mouth daily. [provider]  Active Self, Pharmacy Records  naloxone [facility]) nasal spray 4 mg/0.1 mL [id number]  Place 1 spray into the nose once. [provider]  Active Self, Pharmacy Records  nitroGLYCERIN  (NITROSTAT ) 0.4 MG SL tablet [id number]  Place 1 tablet (0.4 mg total) under the tongue every 5 (five) minutes as needed for chest pain. Carlin Delon BROCKS, NP  Active Self, Pharmacy Records           Med Note LEOBARDO, NICOLE   Mon Jun 07, 2023  9:51 PM) Andris on hand  ondansetron  (ZOFRAN -ODT) 4 MG disintegrating tablet [id number]  Take 4 mg by mouth every 6 (six) hours as needed. [provider]  Active Self, Pharmacy Records           Med Note LEOBARDO, NICOLE   Mon Jun 07, 2023  9:52 PM) Keeps on hand  PREMARIN vaginal  cream [id number]  Place 0.5 g vaginally 2 (two) times a week. Apply Wed and Sun [provider]  Active Self, Pharmacy Records  QUEtiapine  (SEROQUEL ) 300 MG tablet [id number]  Take 300 mg by mouth at bedtime. [provider]  Active Self, Pharmacy Records  Discontinued 12/28/22 0900 (Dose change)   torsemide  (DEMADEX ) 20 MG tablet [id number]  Take 3 tablets (60 mg total) by mouth as directed. Take 40 mg ( 2 tabs) in am and 20 mg ( 1 tab) in afternoon. Carlin Delon BROCKS, NP  Active Self, Pharmacy Records  traZODone  (DESYREL ) 50 MG tablet [id number]  Take 100 mg by mouth at bedtime. [provider]  Active Self, Pharmacy Records  [facility] ELLIPTA 200-62.5-25 MCG/ACT AEPB [id number]  Inhale 1 puff into the lungs daily. [provider]  Active Self, Pharmacy Records            Recommendation:   Continue Current Plan of Care  Follow Up Plan:   Telephone follow-up 09/20/2023 at 1:00 PM.  Olam Ally, MSW, LCSW [place]  Value  Based Care Institute, [facility] Licensed Clinical Social Worker Direct Dial: [phone]

## 2023-09-03 NOTE — Patient Instructions (Signed)
 Visit Information  Thank you for taking time to visit with me today. Please don't hesitate to contact me if I can be of assistance to you before our next scheduled appointment.  Your next care management appointment is by telephone on 09/20/2023 at 1:00 PM   Please call the care guide team at [phone] if you need to cancel, schedule, or reschedule an appointment.   Please call 911 if you are experiencing a Mental Health or Behavioral Health Crisis or need someone to talk to.  Olam Ally, MSW, LCSW [place]  Value Based Care Institute, [facility] Licensed Clinical Social Worker Direct Dial: [phone]

## 2023-09-06 DIAGNOSIS — L97812 Non-pressure chronic ulcer of other part of right lower leg with fat layer exposed: Secondary | ICD-10-CM | POA: Diagnosis not present

## 2023-09-06 DIAGNOSIS — I959 Hypotension, unspecified: Secondary | ICD-10-CM | POA: Diagnosis not present

## 2023-09-06 DIAGNOSIS — L97213 Non-pressure chronic ulcer of right calf with necrosis of muscle: Secondary | ICD-10-CM | POA: Diagnosis not present

## 2023-09-06 DIAGNOSIS — I110 Hypertensive heart disease with heart failure: Secondary | ICD-10-CM | POA: Diagnosis not present

## 2023-09-06 DIAGNOSIS — I700 Atherosclerosis of aorta: Secondary | ICD-10-CM | POA: Diagnosis not present

## 2023-09-06 DIAGNOSIS — N179 Acute kidney failure, unspecified: Secondary | ICD-10-CM | POA: Diagnosis not present

## 2023-09-06 DIAGNOSIS — I5032 Chronic diastolic (congestive) heart failure: Secondary | ICD-10-CM | POA: Diagnosis not present

## 2023-09-06 DIAGNOSIS — I872 Venous insufficiency (chronic) (peripheral): Secondary | ICD-10-CM | POA: Diagnosis not present

## 2023-09-06 DIAGNOSIS — F319 Bipolar disorder, unspecified: Secondary | ICD-10-CM | POA: Diagnosis not present

## 2023-09-07 ENCOUNTER — Other Ambulatory Visit: Payer: Self-pay

## 2023-09-07 DIAGNOSIS — I959 Hypotension, unspecified: Secondary | ICD-10-CM | POA: Diagnosis not present

## 2023-09-07 DIAGNOSIS — F319 Bipolar disorder, unspecified: Secondary | ICD-10-CM | POA: Diagnosis not present

## 2023-09-07 DIAGNOSIS — I872 Venous insufficiency (chronic) (peripheral): Secondary | ICD-10-CM | POA: Diagnosis not present

## 2023-09-07 DIAGNOSIS — I110 Hypertensive heart disease with heart failure: Secondary | ICD-10-CM | POA: Diagnosis not present

## 2023-09-07 DIAGNOSIS — L97812 Non-pressure chronic ulcer of other part of right lower leg with fat layer exposed: Secondary | ICD-10-CM | POA: Diagnosis not present

## 2023-09-07 DIAGNOSIS — I700 Atherosclerosis of aorta: Secondary | ICD-10-CM | POA: Diagnosis not present

## 2023-09-07 DIAGNOSIS — N179 Acute kidney failure, unspecified: Secondary | ICD-10-CM | POA: Diagnosis not present

## 2023-09-07 DIAGNOSIS — L97213 Non-pressure chronic ulcer of right calf with necrosis of muscle: Secondary | ICD-10-CM | POA: Diagnosis not present

## 2023-09-07 DIAGNOSIS — I5032 Chronic diastolic (congestive) heart failure: Secondary | ICD-10-CM | POA: Diagnosis not present

## 2023-09-07 NOTE — Patient Outreach (Signed)
 Complex Care Management   Visit Note  09/07/2023  Name:  Tracy Oconnor MRN: [id number] DOB: Dec 03, 1960  Situation: Referral received for Complex Care Management related to SDOH Barriers:  Transportation Food insecurity Need for motorized scooter. I obtained verbal consent from Patient.  Visit completed with patient  on the phone  Background:   Past Medical History:  Diagnosis Date   Aortic atherosclerosis (HCC) 07/29/2022   Atherosclerosis    Bilateral carpal tunnel syndrome 01/30/2021   Bipolar disorder (HCC)    CAD (coronary artery disease)    Cervical radiculopathy 01/30/2021   COPD (chronic obstructive pulmonary disease) (HCC)    Coronary artery disease 07/29/2022   DOE (dyspnea on exertion) 07/29/2022   Edema    Elevated lipoprotein A level    2024   Enteritis 12/17/2020   Fatigue    Hyperlipidemia    Hypertension    Intermittent chest pain    Mitral regurgitation    Murmur    Obesity (BMI 30.0-34.9) 07/29/2022   OSA (obstructive sleep apnea)    Palpitations     Assessment: BSW held f/u appointment with patient. Patient was alert and cognitive. BSW confirmed with patient that he spoke with [facility] [place] regarding previous efforts for a scooter. Patient explained the issue has been that [facility] will refer her to in-network providers and then end up being out-of-network.  Patient also expressed interest in enrolling into Meals on Wheels. BSW will reach out and confirm eligibility and get back with patient. Patient understood and agreed. BSW and patient will be calling [employer] together at their next f/u appointment regarding need for motorized scooter.   SDOH Interventions    Flowsheet Row Patient Outreach Telephone from 09/07/2023 in [place] POPULATION HEALTH DEPARTMENT Patient Outreach from 08/19/2023 in [place] POPULATION HEALTH DEPARTMENT  SDOH Interventions    Food Insecurity Interventions Patient Declined --  [Will schedule visit with BSW]  Housing  Interventions Intervention Not Indicated Intervention Not Indicated  Transportation Interventions Community Resources Provided, Patient Resources (Friends/Family)  [Patient uses [employer] for medical transportation. Services are free.] Other (Comment)  [Will schedule with BSW]  Utilities Interventions Intervention Not Indicated Intervention Not Indicated    Recommendation:   none  Follow Up Plan:   Telephone follow up appointment date/time:  09/15/2023 at 10:30AM.  Laymon Doll, BSW [place]/VBCI - [facility] Social Worker [phone]

## 2023-09-07 NOTE — Patient Instructions (Signed)
 Visit Information  Thank you for taking time to visit with me today. Please don't hesitate to contact me if I can be of assistance to you before our next scheduled appointment.  Your next care management appointment is by telephone on 09/15/2023 at 10:30AM.   Please call the care guide team at [phone] if you need to cancel, schedule, or reschedule an appointment.   Please call the Suicide and Crisis Lifeline: 988 call 911 if you are experiencing a Mental Health or Behavioral Health Crisis or need someone to talk to.  Laymon Doll, BSW [place]/VBCI - [employer] Social Worker [phone]

## 2023-09-08 DIAGNOSIS — L97912 Non-pressure chronic ulcer of unspecified part of right lower leg with fat layer exposed: Secondary | ICD-10-CM | POA: Diagnosis not present

## 2023-09-10 DIAGNOSIS — L97812 Non-pressure chronic ulcer of other part of right lower leg with fat layer exposed: Secondary | ICD-10-CM | POA: Diagnosis not present

## 2023-09-10 DIAGNOSIS — I5032 Chronic diastolic (congestive) heart failure: Secondary | ICD-10-CM | POA: Diagnosis not present

## 2023-09-10 DIAGNOSIS — I959 Hypotension, unspecified: Secondary | ICD-10-CM | POA: Diagnosis not present

## 2023-09-10 DIAGNOSIS — I700 Atherosclerosis of aorta: Secondary | ICD-10-CM | POA: Diagnosis not present

## 2023-09-10 DIAGNOSIS — N179 Acute kidney failure, unspecified: Secondary | ICD-10-CM | POA: Diagnosis not present

## 2023-09-10 DIAGNOSIS — L97213 Non-pressure chronic ulcer of right calf with necrosis of muscle: Secondary | ICD-10-CM | POA: Diagnosis not present

## 2023-09-10 DIAGNOSIS — F319 Bipolar disorder, unspecified: Secondary | ICD-10-CM | POA: Diagnosis not present

## 2023-09-10 DIAGNOSIS — I872 Venous insufficiency (chronic) (peripheral): Secondary | ICD-10-CM | POA: Diagnosis not present

## 2023-09-10 DIAGNOSIS — I110 Hypertensive heart disease with heart failure: Secondary | ICD-10-CM | POA: Diagnosis not present

## 2023-09-13 ENCOUNTER — Other Ambulatory Visit: Payer: Self-pay | Admitting: Physician Assistant

## 2023-09-13 DIAGNOSIS — I872 Venous insufficiency (chronic) (peripheral): Secondary | ICD-10-CM | POA: Diagnosis not present

## 2023-09-13 DIAGNOSIS — I5032 Chronic diastolic (congestive) heart failure: Secondary | ICD-10-CM | POA: Diagnosis not present

## 2023-09-13 DIAGNOSIS — F319 Bipolar disorder, unspecified: Secondary | ICD-10-CM | POA: Diagnosis not present

## 2023-09-13 DIAGNOSIS — L97213 Non-pressure chronic ulcer of right calf with necrosis of muscle: Secondary | ICD-10-CM | POA: Diagnosis not present

## 2023-09-13 DIAGNOSIS — I700 Atherosclerosis of aorta: Secondary | ICD-10-CM | POA: Diagnosis not present

## 2023-09-13 DIAGNOSIS — L97812 Non-pressure chronic ulcer of other part of right lower leg with fat layer exposed: Secondary | ICD-10-CM | POA: Diagnosis not present

## 2023-09-13 DIAGNOSIS — Z1231 Encounter for screening mammogram for malignant neoplasm of breast: Secondary | ICD-10-CM

## 2023-09-13 DIAGNOSIS — I959 Hypotension, unspecified: Secondary | ICD-10-CM | POA: Diagnosis not present

## 2023-09-13 DIAGNOSIS — I110 Hypertensive heart disease with heart failure: Secondary | ICD-10-CM | POA: Diagnosis not present

## 2023-09-13 DIAGNOSIS — N179 Acute kidney failure, unspecified: Secondary | ICD-10-CM | POA: Diagnosis not present

## 2023-09-14 DIAGNOSIS — L97812 Non-pressure chronic ulcer of other part of right lower leg with fat layer exposed: Secondary | ICD-10-CM | POA: Diagnosis not present

## 2023-09-14 DIAGNOSIS — I110 Hypertensive heart disease with heart failure: Secondary | ICD-10-CM | POA: Diagnosis not present

## 2023-09-14 DIAGNOSIS — I5032 Chronic diastolic (congestive) heart failure: Secondary | ICD-10-CM | POA: Diagnosis not present

## 2023-09-14 DIAGNOSIS — N179 Acute kidney failure, unspecified: Secondary | ICD-10-CM | POA: Diagnosis not present

## 2023-09-14 DIAGNOSIS — I959 Hypotension, unspecified: Secondary | ICD-10-CM | POA: Diagnosis not present

## 2023-09-14 DIAGNOSIS — L97213 Non-pressure chronic ulcer of right calf with necrosis of muscle: Secondary | ICD-10-CM | POA: Diagnosis not present

## 2023-09-14 DIAGNOSIS — I700 Atherosclerosis of aorta: Secondary | ICD-10-CM | POA: Diagnosis not present

## 2023-09-14 DIAGNOSIS — I872 Venous insufficiency (chronic) (peripheral): Secondary | ICD-10-CM | POA: Diagnosis not present

## 2023-09-14 DIAGNOSIS — F319 Bipolar disorder, unspecified: Secondary | ICD-10-CM | POA: Diagnosis not present

## 2023-09-15 ENCOUNTER — Ambulatory Visit: Admitting: Cardiology

## 2023-09-15 ENCOUNTER — Ambulatory Visit
Admission: RE | Admit: 2023-09-15 | Discharge: 2023-09-15 | Disposition: A | Discharge status: Home / Self Care | Source: Ambulatory Visit | Attending: Physician Assistant | Admitting: Physician Assistant

## 2023-09-15 ENCOUNTER — Other Ambulatory Visit: Payer: Self-pay

## 2023-09-15 DIAGNOSIS — L97213 Non-pressure chronic ulcer of right calf with necrosis of muscle: Secondary | ICD-10-CM | POA: Diagnosis not present

## 2023-09-15 DIAGNOSIS — I110 Hypertensive heart disease with heart failure: Secondary | ICD-10-CM | POA: Diagnosis not present

## 2023-09-15 DIAGNOSIS — N179 Acute kidney failure, unspecified: Secondary | ICD-10-CM | POA: Diagnosis not present

## 2023-09-15 DIAGNOSIS — I700 Atherosclerosis of aorta: Secondary | ICD-10-CM | POA: Diagnosis not present

## 2023-09-15 DIAGNOSIS — I959 Hypotension, unspecified: Secondary | ICD-10-CM | POA: Diagnosis not present

## 2023-09-15 DIAGNOSIS — Z1231 Encounter for screening mammogram for malignant neoplasm of breast: Secondary | ICD-10-CM | POA: Diagnosis not present

## 2023-09-15 DIAGNOSIS — I5032 Chronic diastolic (congestive) heart failure: Secondary | ICD-10-CM | POA: Diagnosis not present

## 2023-09-15 DIAGNOSIS — F319 Bipolar disorder, unspecified: Secondary | ICD-10-CM | POA: Diagnosis not present

## 2023-09-15 DIAGNOSIS — I872 Venous insufficiency (chronic) (peripheral): Secondary | ICD-10-CM | POA: Diagnosis not present

## 2023-09-15 DIAGNOSIS — L97812 Non-pressure chronic ulcer of other part of right lower leg with fat layer exposed: Secondary | ICD-10-CM | POA: Diagnosis not present

## 2023-09-15 NOTE — Patient Outreach (Signed)
 Complex Care Management   Visit Note  09/15/2023  Name:  Tracy Oconnor MRN: [id number] DOB: 03/30/1960  Situation: Referral received for Complex Care Management related to SDOH Barriers:  Transportation Need for motorized scooter. I obtained verbal consent from Patient.  Visit completed with patient  on the phone  Background:   Past Medical History:  Diagnosis Date   Aortic atherosclerosis (HCC) 07/29/2022   Atherosclerosis    Bilateral carpal tunnel syndrome 01/30/2021   Bipolar disorder (HCC)    CAD (coronary artery disease)    Cervical radiculopathy 01/30/2021   COPD (chronic obstructive pulmonary disease) (HCC)    Coronary artery disease 07/29/2022   DOE (dyspnea on exertion) 07/29/2022   Edema    Elevated lipoprotein A level    2024   Enteritis 12/17/2020   Fatigue    Hyperlipidemia    Hypertension    Intermittent chest pain    Mitral regurgitation    Murmur    Obesity (BMI 30.0-34.9) 07/29/2022   OSA (obstructive sleep apnea)    Palpitations     Assessment: BSW completed f/u with pt. Pt was alert and cognitive. Pt was given MOW phone # and encouraged to speak with Tracy Oconnor [phone]) re MOW enrollment. Pt agreed to call. BSW and Pt called [employer] re in-network vendors for motorized scooters. Pt was advised she must have her PCP sent an authorization for the scooter and then have a prescription for the scooter sent to either vendor she was provided Insurance claims handler [employer] or [facility]). Pt states she will call her CM at [facility] and request assistance with the paperwork needed. No other resources were provided/requested at this time.   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 Services are free.] Other (Comment)  [Will schedule with BSW]  Utilities Interventions -- Intervention Not Indicated Intervention Not Indicated      Recommendation:   Patient call CM to begin paperwork for motorized scooter.  Follow Up Plan:   Telephone follow up appointment date/time:  09/29/2023 at 10:30AM  Laymon Doll, BSW [place]/VBCI - [facility] Social Worker [phone]

## 2023-09-15 NOTE — Patient Instructions (Signed)
 Visit Information  Thank you for taking time to visit with me today. Please don't hesitate to contact me if I can be of assistance to you before our next scheduled appointment.  Your next care management appointment is by telephone on 09/29/2023 at 10:30AM   Please call the care guide team at [phone] if you need to cancel, schedule, or reschedule an appointment.   Please call the Suicide and Crisis Lifeline: 988 call the [place]  National Suicide Prevention Lifeline: 732-434-9230 or TTY: 754 506 6212 TTY 872-295-2465) to talk to a trained counselor call 1-800-273-TALK (toll free, 24 hour hotline) call 911 if you are experiencing a Mental Health or Behavioral Health Crisis or need someone to talk to.  Laymon Doll, BSW [place]/VBCI - [employer] Social Worker [phone]

## 2023-09-16 ENCOUNTER — Ambulatory Visit: Attending: Cardiology | Admitting: Cardiology

## 2023-09-16 ENCOUNTER — Encounter: Payer: Self-pay | Admitting: Cardiology

## 2023-09-16 VITALS — BP 110/60 | Ht 60.0 in | Wt 152.2 lb

## 2023-09-16 DIAGNOSIS — I700 Atherosclerosis of aorta: Secondary | ICD-10-CM

## 2023-09-16 DIAGNOSIS — G4733 Obstructive sleep apnea (adult) (pediatric): Secondary | ICD-10-CM

## 2023-09-16 DIAGNOSIS — E782 Mixed hyperlipidemia: Secondary | ICD-10-CM | POA: Diagnosis not present

## 2023-09-16 DIAGNOSIS — I10 Essential (primary) hypertension: Secondary | ICD-10-CM | POA: Diagnosis not present

## 2023-09-16 DIAGNOSIS — J431 Panlobular emphysema: Secondary | ICD-10-CM | POA: Diagnosis not present

## 2023-09-16 DIAGNOSIS — I2510 Atherosclerotic heart disease of native coronary artery without angina pectoris: Secondary | ICD-10-CM | POA: Diagnosis not present

## 2023-09-16 MED ORDER — ISOSORBIDE MONONITRATE ER 30 MG PO TB24: 15.0000 mg | ORAL_TABLET | Freq: Every day | ORAL | 3 refills | Status: AC

## 2023-09-16 NOTE — Progress Notes (Signed)
 Cardiology Office Note:    Date:  09/16/2023   ID:  Tracy Oconnor, DOB [birth year], MRN [id number]  PCP:  Trinidad Hun, MD  Cardiologist:  Jennifer [name], MD   Referring MD: Trinidad Hun, MD    ASSESSMENT:    1. Aortic atherosclerosis (HCC)   2. Coronary artery disease involving native coronary artery of native heart without angina pectoris   3. Primary hypertension   4. Panlobular emphysema (HCC)   5. OSA (obstructive sleep apnea)   6. Mixed hyperlipidemia    PLAN:    In order of problems listed above:  Coronary artery disease: Secondary prevention stressed with the patient.  Importance of compliance with diet medication stressed and patient verbalized standing. She was advised to ambulate to the best of her ability. Essential hypertension: Blood pressure is stable and diet was emphasized.  Lifestyle modification urged. Mixed dyslipidemia: On lipid-lowering medications.  Lipids followed by primary care. Cigarette smoker quit smoking 3 years ago and promises never to go back to smoking. Obesity: Weight reduction stressed diet emphasized and she promises to do better.  Risks of obesity explained. Patient will be seen in follow-up appointment in 6 months or earlier if the patient has any concerns.    Medication Adjustments/Labs and Tests Ordered: Current medicines are reviewed at length with the patient today.  Concerns regarding medicines are outlined above.  No orders of the defined types were placed in this encounter.  No orders of the defined types were placed in this encounter.    No chief complaint on file.    History of Present Illness:    Tracy Oconnor is a 63 y.o. female.  Patient has past medical history of coronary artery disease, essential hypertension, mixed dyslipidemia and obstructive sleep apnea.  She denies any problems at this time and takes care of activities of daily living.  She ambulates with a walker.  She tells me that she is having problems  with short-term memory.  She was discharged from the hospital recently I reviewed those records and discussed with her.  Past Medical History:  Diagnosis Date   Aortic atherosclerosis (HCC) 07/29/2022   Atherosclerosis    Bilateral carpal tunnel syndrome 01/30/2021   Bipolar disorder (HCC)    Cervical radiculopathy 01/30/2021   Chronic pain syndrome 07/06/2023   COPD (chronic obstructive pulmonary disease) (HCC)    Coronary artery disease 07/29/2022   Diastolic dysfunction 06/07/2023   DOE (dyspnea on exertion) 07/29/2022   Edema    Elevated lipoprotein A level    2024   Enteritis 12/17/2020   Fatigue    Hyperlipidemia    Hypertension    Hyponatremia 06/07/2023   Intermittent chest pain    Long term (current) use of opiate analgesic 07/06/2023   Mitral regurgitation    Multiple open wounds of lower extremity 06/07/2023   Murmur    Obesity (BMI 30.0-34.9) 07/29/2022   OSA (obstructive sleep apnea)    Palpitations    Spondylosis 07/06/2023    Past Surgical History:  Procedure Laterality Date   CHOLECYSTECTOMY     CORONARY PRESSURE/FFR STUDY N/A 08/13/2022   Procedure: CORONARY PRESSURE/FFR STUDY;  Surgeon: Mady Bruckner, MD;  [facility]: [facility];  Service: Cardiovascular;  Laterality: N/A;   LEFT HEART CATH AND CORONARY ANGIOGRAPHY N/A 08/13/2022   Procedure: LEFT HEART CATH AND CORONARY ANGIOGRAPHY;  Surgeon: Mady Bruckner, MD;  Location: [facility] INVAS[facility];  Service: Cardiovascular;  Laterality: N/A;   TONSILLECTOMY  TUBAL LIGATION      Current Medications: Current Meds  Medication Sig   albuterol  (VENTOLIN  HFA) 108 (90 Base) MCG/ACT inhaler Inhale 2 puffs into the lungs every 4 (four) hours as needed for shortness of breath or wheezing.   ARIPiprazole  (ABILIFY ) 15 MG tablet Take 15 mg by mouth daily.   aspirin  EC 81 MG tablet Take 1 tablet (81 mg total) by mouth daily. Swallow whole.   atorvastatin  (LIPITOR) 40 MG tablet Take 1 tablet (40 mg total)  by mouth at bedtime.   Calcium  Carb-Cholecalciferol (OYSTER SHELL CALCIUM  250+D) 250-3.125 MG-MCG TABS Take 1 tablet by mouth daily.   Cholecalciferol (VITAMIN D-3) 125 MCG (5000 UT) TABS Take 5,000 Units by mouth daily.   citalopram  (CELEXA ) 40 MG tablet Take 40 mg by mouth daily.   clotrimazole (LOTRIMIN) 1 % cream Apply topically 2 (two) times daily.   diclofenac (VOLTAREN) 75 MG EC tablet Take 75 mg by mouth 2 (two) times daily.   fluticasone (FLONASE) 50 MCG/ACT nasal spray Place 2 sprays into both nostrils 2 (two) times daily.   gabapentin  (NEURONTIN ) 400 MG capsule Take 400 mg by mouth 3 (three) times daily.   HYDROcodone -acetaminophen  (NORCO) 10-325 MG tablet Take 1 tablet by mouth 3 (three) times daily as needed for moderate pain (pain score 4-6) or severe pain (pain score 7-10).   ipratropium-albuterol  (DUONEB) 0.5-2.5 (3) MG/3ML SOLN SMARTSIG:1 Vial(s) Every 6 Hours   isosorbide  mononitrate (IMDUR ) 30 MG 24 hr tablet Take 0.5 tablets (15 mg total) by mouth daily.   lidocaine  (LIDODERM ) 5 % Place 1 patch onto the skin daily.   losartan  (COZAAR ) 25 MG tablet Take 25 mg by mouth daily.   methocarbamol  (ROBAXIN ) 500 MG tablet Take 500 mg by mouth 2 (two) times daily.   Multiple Vitamin (MULTIVITAMIN ADULT PO) Take 1 tablet by mouth daily.   naloxone (NARCAN) nasal spray 4 mg/0.1 mL Place 1 spray into the nose once.   nitroGLYCERIN  (NITROSTAT ) 0.4 MG SL tablet Place 1 tablet (0.4 mg total) under the tongue every 5 (five) minutes as needed for chest pain.   ondansetron  (ZOFRAN -ODT) 4 MG disintegrating tablet Take 4 mg by mouth every 6 (six) hours as needed.   PREMARIN vaginal cream Place 0.5 g vaginally 2 (two) times a week. Apply Wed and Sun   QUEtiapine  (SEROQUEL ) 300 MG tablet Take 300 mg by mouth at bedtime.   torsemide  (DEMADEX ) 20 MG tablet Take 3 tablets (60 mg total) by mouth as directed. Take 40 mg ( 2 tabs) in am and 20 mg ( 1 tab) in afternoon.   traZODone  (DESYREL ) 50 MG tablet  Take 100 mg by mouth at bedtime.   TRELEGY ELLIPTA 200-62.5-25 MCG/ACT AEPB Inhale 1 puff into the lungs daily.     Allergies:   Penicillins and Jardiance  [empagliflozin ]   Social History   Socioeconomic History   Marital status: Single    Spouse name: Not on file   Number of children: Not on file   Years of education: Not on file   Highest education level: Not on file  Occupational History   Not on file  Tobacco Use   Smoking status: Former    Types: Cigarettes   Smokeless tobacco: Never  Substance and Sexual Activity   Alcohol  use: Not on file   Drug use: Not on file   Sexual activity: Not on file  Other Topics Concern   Not on file  Social History Narrative   Not on file  Social Drivers of Corporate investment banker Strain: Not on file  Food Insecurity: No Food Insecurity (09/15/2023)   Hunger Vital Sign    Worried About Running Out of Food in the Last Year: Never true    Ran Out of Food in the Last Year: Never true  Recent Concern: Food Insecurity - Food Insecurity Present (09/07/2023)   Hunger Vital Sign    Worried About Running Out of Food in the Last Year: Often true    Ran Out of Food in the Last Year: Often true  Transportation Needs: Unmet Transportation Needs (09/07/2023)   PRAPARE - Administrator, Civil Service (Medical): Yes    Lack of Transportation (Non-Medical): No  Physical Activity: Not on file  Stress: Not on file  Social Connections: Moderately Isolated (06/08/2023)   Social Connection and Isolation Panel    Frequency of Communication with Friends and Family: More than three times a week    Frequency of Social Gatherings with Friends and Family: Three times a week    Attends Religious Services: Never    Active Member of Clubs or Organizations: No    Attends Engineer, structural: More than 4 times per year    Marital Status: Never married     Family History: The patient's family history includes Breast cancer in her maternal  aunt; Cancer in her father; Heart disease in her mother.  ROS:   Please see the history of present illness.    All other systems reviewed and are negative.  EKGs/Labs/Other Studies Reviewed:    The following studies were reviewed today: .SABRA   I discussed my findings with the patient at length   Recent Labs: 02/18/2023: NT-Pro BNP 548 06/07/2023: TSH 3.933 07/06/2023: ALT 19; BUN 20; Creatinine, Ser 1.20; Hemoglobin 11.7; Platelets 376; Potassium 5.2; Sodium 138  Recent Lipid Panel    Component Value Date/Time   LDLDIRECT 59 07/06/2023 1025    Physical Exam:    VS:  BP 110/60   Ht 5' (1.524 m)   Wt 152 lb 3.2 oz (69 kg)   SpO2 94%   BMI 29.72 kg/m     Wt Readings from Last 3 Encounters:  09/16/23 152 lb 3.2 oz (69 kg)  07/06/23 160 lb (72.6 kg)  04/05/23 168 lb (76.2 kg)     GEN: Patient is in no acute distress HEENT: Normal NECK: No JVD; No carotid bruits LYMPHATICS: No lymphadenopathy CARDIAC: Hear sounds regular, 2/6 systolic murmur at the apex. RESPIRATORY:  Clear to auscultation without rales, wheezing or rhonchi  ABDOMEN: Soft, non-tender, non-distended MUSCULOSKELETAL:  No edema; No deformity  SKIN: Warm and dry NEUROLOGIC:  Alert and oriented x 3 PSYCHIATRIC:  Normal affect   Signed, Jennifer [name], MD  09/16/2023 10:33 AM    [facility]

## 2023-09-16 NOTE — Patient Instructions (Signed)

## 2023-09-16 NOTE — H&P (View-Only) (Signed)
 Cardiology Office Note:    Date:  09/16/2023   ID:  Tracy Oconnor, DOB [birth year], MRN [id number]  PCP:  Trinidad Hun, MD  Cardiologist:  Jennifer [name], MD   Referring MD: Trinidad Hun, MD    ASSESSMENT:    1. Aortic atherosclerosis (HCC)   2. Coronary artery disease involving native coronary artery of native heart without angina pectoris   3. Primary hypertension   4. Panlobular emphysema (HCC)   5. OSA (obstructive sleep apnea)   6. Mixed hyperlipidemia    PLAN:    In order of problems listed above:  Coronary artery disease: Secondary prevention stressed with the patient.  Importance of compliance with diet medication stressed and patient verbalized standing. She was advised to ambulate to the best of her ability. Essential hypertension: Blood pressure is stable and diet was emphasized.  Lifestyle modification urged. Mixed dyslipidemia: On lipid-lowering medications.  Lipids followed by primary care. Cigarette smoker quit smoking 3 years ago and promises never to go back to smoking. Obesity: Weight reduction stressed diet emphasized and she promises to do better.  Risks of obesity explained. Patient will be seen in follow-up appointment in 6 months or earlier if the patient has any concerns.    Medication Adjustments/Labs and Tests Ordered: Current medicines are reviewed at length with the patient today.  Concerns regarding medicines are outlined above.  No orders of the defined types were placed in this encounter.  No orders of the defined types were placed in this encounter.    No chief complaint on file.    History of Present Illness:    Tracy Oconnor is a 63 y.o. female.  Patient has past medical history of coronary artery disease, essential hypertension, mixed dyslipidemia and obstructive sleep apnea.  She denies any problems at this time and takes care of activities of daily living.  She ambulates with a walker.  She tells me that she is having problems  with short-term memory.  She was discharged from the hospital recently I reviewed those records and discussed with her.  Past Medical History:  Diagnosis Date   Aortic atherosclerosis (HCC) 07/29/2022   Atherosclerosis    Bilateral carpal tunnel syndrome 01/30/2021   Bipolar disorder (HCC)    Cervical radiculopathy 01/30/2021   Chronic pain syndrome 07/06/2023   COPD (chronic obstructive pulmonary disease) (HCC)    Coronary artery disease 07/29/2022   Diastolic dysfunction 06/07/2023   DOE (dyspnea on exertion) 07/29/2022   Edema    Elevated lipoprotein A level    2024   Enteritis 12/17/2020   Fatigue    Hyperlipidemia    Hypertension    Hyponatremia 06/07/2023   Intermittent chest pain    Long term (current) use of opiate analgesic 07/06/2023   Mitral regurgitation    Multiple open wounds of lower extremity 06/07/2023   Murmur    Obesity (BMI 30.0-34.9) 07/29/2022   OSA (obstructive sleep apnea)    Palpitations    Spondylosis 07/06/2023    Past Surgical History:  Procedure Laterality Date   CHOLECYSTECTOMY     CORONARY PRESSURE/FFR STUDY N/A 08/13/2022   Procedure: CORONARY PRESSURE/FFR STUDY;  Surgeon: Mady Bruckner, MD;  Location: [facility];  Service: Cardiovascular;  Laterality: N/A;   LEFT HEART CATH AND CORONARY ANGIOGRAPHY N/A 08/13/2022   Procedure: LEFT HEART CATH AND CORONARY ANGIOGRAPHY;  Surgeon: Mady Bruckner, MD;  Location: [facility];  Service: Cardiovascular;  Laterality: N/A;   TONSILLECTOMY  TUBAL LIGATION      Current Medications: Current Meds  Medication Sig   albuterol  (VENTOLIN  HFA) 108 (90 Base) MCG/ACT inhaler Inhale 2 puffs into the lungs every 4 (four) hours as needed for shortness of breath or wheezing.   ARIPiprazole  (ABILIFY ) 15 MG tablet Take 15 mg by mouth daily.   aspirin  EC 81 MG tablet Take 1 tablet (81 mg total) by mouth daily. Swallow whole.   atorvastatin  (LIPITOR) 40 MG tablet Take 1 tablet (40 mg total)  by mouth at bedtime.   Calcium  Carb-Cholecalciferol (OYSTER SHELL CALCIUM  250+D) 250-3.125 MG-MCG TABS Take 1 tablet by mouth daily.   Cholecalciferol (VITAMIN D-3) 125 MCG (5000 UT) TABS Take 5,000 Units by mouth daily.   citalopram  (CELEXA ) 40 MG tablet Take 40 mg by mouth daily.   clotrimazole (LOTRIMIN) 1 % cream Apply topically 2 (two) times daily.   diclofenac (VOLTAREN) 75 MG EC tablet Take 75 mg by mouth 2 (two) times daily.   fluticasone (FLONASE) 50 MCG/ACT nasal spray Place 2 sprays into both nostrils 2 (two) times daily.   gabapentin  (NEURONTIN ) 400 MG capsule Take 400 mg by mouth 3 (three) times daily.   HYDROcodone -acetaminophen  (NORCO) 10-325 MG tablet Take 1 tablet by mouth 3 (three) times daily as needed for moderate pain (pain score 4-6) or severe pain (pain score 7-10).   ipratropium-albuterol  (DUONEB) 0.5-2.5 (3) MG/3ML SOLN SMARTSIG:1 Vial(s) Every 6 Hours   isosorbide  mononitrate (IMDUR ) 30 MG 24 hr tablet Take 0.5 tablets (15 mg total) by mouth daily.   lidocaine  (LIDODERM ) 5 % Place 1 patch onto the skin daily.   losartan  (COZAAR ) 25 MG tablet Take 25 mg by mouth daily.   methocarbamol  (ROBAXIN ) 500 MG tablet Take 500 mg by mouth 2 (two) times daily.   Multiple Vitamin (MULTIVITAMIN ADULT PO) Take 1 tablet by mouth daily.   naloxone (NARCAN) nasal spray 4 mg/0.1 mL Place 1 spray into the nose once.   nitroGLYCERIN  (NITROSTAT ) 0.4 MG SL tablet Place 1 tablet (0.4 mg total) under the tongue every 5 (five) minutes as needed for chest pain.   ondansetron  (ZOFRAN -ODT) 4 MG disintegrating tablet Take 4 mg by mouth every 6 (six) hours as needed.   PREMARIN vaginal cream Place 0.5 g vaginally 2 (two) times a week. Apply Wed and Sun   QUEtiapine  (SEROQUEL ) 300 MG tablet Take 300 mg by mouth at bedtime.   torsemide  (DEMADEX ) 20 MG tablet Take 3 tablets (60 mg total) by mouth as directed. Take 40 mg ( 2 tabs) in am and 20 mg ( 1 tab) in afternoon.   traZODone  (DESYREL ) 50 MG tablet  Take 100 mg by mouth at bedtime.   TRELEGY ELLIPTA 200-62.5-25 MCG/ACT AEPB Inhale 1 puff into the lungs daily.     Allergies:   Penicillins and Jardiance  [empagliflozin ]   Social History   Socioeconomic History   Marital status: Single    Spouse name: Not on file   Number of children: Not on file   Years of education: Not on file   Highest education level: Not on file  Occupational History   Not on file  Tobacco Use   Smoking status: Former    Types: Cigarettes   Smokeless tobacco: Never  Substance and Sexual Activity   Alcohol  use: Not on file   Drug use: Not on file   Sexual activity: Not on file  Other Topics Concern   Not on file  Social History Narrative   Not on file  Social Drivers of Corporate investment banker Strain: Not on file  Food Insecurity: No Food Insecurity (09/15/2023)   Hunger Vital Sign    Worried About Running Out of Food in the Last Year: Never true    Ran Out of Food in the Last Year: Never true  Recent Concern: Food Insecurity - Food Insecurity Present (09/07/2023)   Hunger Vital Sign    Worried About Running Out of Food in the Last Year: Often true    Ran Out of Food in the Last Year: Often true  Transportation Needs: Unmet Transportation Needs (09/07/2023)   PRAPARE - Administrator, Civil Service (Medical): Yes    Lack of Transportation (Non-Medical): No  Physical Activity: Not on file  Stress: Not on file  Social Connections: Moderately Isolated (06/08/2023)   Social Connection and Isolation Panel    Frequency of Communication with Friends and Family: More than three times a week    Frequency of Social Gatherings with Friends and Family: Three times a week    Attends Religious Services: Never    Active Member of Clubs or Organizations: No    Attends Engineer, structural: More than 4 times per year    Marital Status: Never married     Family History: The patient's family history includes Breast cancer in her maternal  aunt; Cancer in her father; Heart disease in her mother.  ROS:   Please see the history of present illness.    All other systems reviewed and are negative.  EKGs/Labs/Other Studies Reviewed:    The following studies were reviewed today: .SABRA   I discussed my findings with the patient at length   Recent Labs: 02/18/2023: NT-Pro BNP 548 06/07/2023: TSH 3.933 07/06/2023: ALT 19; BUN 20; Creatinine, Ser 1.20; Hemoglobin 11.7; Platelets 376; Potassium 5.2; Sodium 138  Recent Lipid Panel    Component Value Date/Time   LDLDIRECT 59 07/06/2023 1025    Physical Exam:    VS:  BP 110/60   Ht 5' (1.524 m)   Wt 152 lb 3.2 oz (69 kg)   SpO2 94%   BMI 29.72 kg/m     Wt Readings from Last 3 Encounters:  09/16/23 152 lb 3.2 oz (69 kg)  07/06/23 160 lb (72.6 kg)  04/05/23 168 lb (76.2 kg)     GEN: Patient is in no acute distress HEENT: Normal NECK: No JVD; No carotid bruits LYMPHATICS: No lymphadenopathy CARDIAC: Hear sounds regular, 2/6 systolic murmur at the apex. RESPIRATORY:  Clear to auscultation without rales, wheezing or rhonchi  ABDOMEN: Soft, non-tender, non-distended MUSCULOSKELETAL:  No edema; No deformity  SKIN: Warm and dry NEUROLOGIC:  Alert and oriented x 3 PSYCHIATRIC:  Normal affect   Signed, Jennifer [name], MD  09/16/2023 10:33 AM    [facility]

## 2023-09-17 DIAGNOSIS — L97912 Non-pressure chronic ulcer of unspecified part of right lower leg with fat layer exposed: Secondary | ICD-10-CM | POA: Diagnosis not present

## 2023-09-20 ENCOUNTER — Other Ambulatory Visit: Payer: Self-pay

## 2023-09-20 DIAGNOSIS — G4736 Sleep related hypoventilation in conditions classified elsewhere: Secondary | ICD-10-CM | POA: Diagnosis not present

## 2023-09-20 DIAGNOSIS — I5032 Chronic diastolic (congestive) heart failure: Secondary | ICD-10-CM | POA: Diagnosis not present

## 2023-09-20 DIAGNOSIS — L97812 Non-pressure chronic ulcer of other part of right lower leg with fat layer exposed: Secondary | ICD-10-CM | POA: Diagnosis not present

## 2023-09-20 DIAGNOSIS — I110 Hypertensive heart disease with heart failure: Secondary | ICD-10-CM | POA: Diagnosis not present

## 2023-09-20 DIAGNOSIS — J9611 Chronic respiratory failure with hypoxia: Secondary | ICD-10-CM | POA: Diagnosis not present

## 2023-09-20 DIAGNOSIS — I700 Atherosclerosis of aorta: Secondary | ICD-10-CM | POA: Diagnosis not present

## 2023-09-20 DIAGNOSIS — N179 Acute kidney failure, unspecified: Secondary | ICD-10-CM | POA: Diagnosis not present

## 2023-09-20 DIAGNOSIS — F319 Bipolar disorder, unspecified: Secondary | ICD-10-CM | POA: Diagnosis not present

## 2023-09-20 DIAGNOSIS — L97213 Non-pressure chronic ulcer of right calf with necrosis of muscle: Secondary | ICD-10-CM | POA: Diagnosis not present

## 2023-09-20 DIAGNOSIS — J449 Chronic obstructive pulmonary disease, unspecified: Secondary | ICD-10-CM | POA: Diagnosis not present

## 2023-09-20 DIAGNOSIS — I872 Venous insufficiency (chronic) (peripheral): Secondary | ICD-10-CM | POA: Diagnosis not present

## 2023-09-20 DIAGNOSIS — I959 Hypotension, unspecified: Secondary | ICD-10-CM | POA: Diagnosis not present

## 2023-09-20 DIAGNOSIS — G4733 Obstructive sleep apnea (adult) (pediatric): Secondary | ICD-10-CM | POA: Diagnosis not present

## 2023-09-20 NOTE — Patient Instructions (Signed)
 Visit Information  Thank you for taking time to visit with me today. Please don't hesitate to contact me if I can be of assistance to you before our next scheduled appointment.  Your next care management appointment is by telephone on 10/07/2023 at 11:30 am   Please call the care guide team at [phone] if you need to cancel, schedule, or reschedule an appointment.   Please call 911 if you are experiencing a Mental Health or Behavioral Health Crisis or need someone to talk to.  Olam Ally, MSW, LCSW [place]  [facility], [facility] Licensed Clinical Social Worker Direct Dial: [phone]

## 2023-09-20 NOTE — Patient Outreach (Signed)
 Complex Care Management   Visit Note  09/20/2023  Name:  Tracy Oconnor MRN: [id number] DOB: 01-01-1961  Situation: Referral received for Complex Care Management related to SDOH Barriers:  Transportation Food insecurity I obtained verbal consent from Patient. Patient is also seeking assistance with securing a scooter.  Visit completed with patient  on the phone. LCSW met with patient for scheduled visit. Patient explained that she will need to follow up with PCP in regards for needed paperwork with scooter.    Background:   Past Medical History:  Diagnosis Date   Aortic atherosclerosis (HCC) 07/29/2022   Atherosclerosis    Bilateral carpal tunnel syndrome 01/30/2021   Bipolar disorder (HCC)    Cervical radiculopathy 01/30/2021   Chronic pain syndrome 07/06/2023   COPD (chronic obstructive pulmonary disease) (HCC)    Coronary artery disease 07/29/2022   Diastolic dysfunction 06/07/2023   DOE (dyspnea on exertion) 07/29/2022   Edema    Elevated lipoprotein A level    2024   Enteritis 12/17/2020   Fatigue    Hyperlipidemia    Hypertension    Hyponatremia 06/07/2023   Intermittent chest pain    Long term (current) use of opiate analgesic 07/06/2023   Mitral regurgitation    Multiple open wounds of lower extremity 06/07/2023   Murmur    Obesity (BMI 30.0-34.9) 07/29/2022   OSA (obstructive sleep apnea)    Palpitations    Spondylosis 07/06/2023    Assessment: Patient Reported Symptoms:  Cognitive Cognitive Status: Normal speech and language skills, Alert and oriented to person, place, and time Cognitive/Intellectual Conditions Management [RPT]: None reported or documented in medical history or problem list      Neurological Neurological Review of Symptoms: Not assessed    HEENT HEENT Symptoms Reported: Not assessed      Cardiovascular Cardiovascular Symptoms Reported: Not assessed    Respiratory Respiratory Symptoms Reported: Not assesed    Endocrine Endocrine  Symptoms Reported: Not assessed    Gastrointestinal Gastrointestinal Symptoms Reported: Not assessed      Genitourinary Genitourinary Symptoms Reported: Not assessed    Integumentary Integumentary Symptoms Reported: Not assessed    Musculoskeletal Musculoskelatal Symptoms Reviewed: Not assessed        Psychosocial Psychosocial Symptoms Reported: No symptoms reported            08/19/2023   10:33 AM  Depression screen PHQ 2/9  Decreased Interest 1  Down, Depressed, Hopeless 0  PHQ - 2 Score 1    There were no vitals filed for this visit.  Medications Reviewed Today     Reviewed by Sherren Olam FORBES KEN (Social Worker) on 09/20/23 at 1122  Med List Status: <None>   Medication Order Taking? Sig Documenting Provider Last Dose Status Informant  albuterol  (VENTOLIN  HFA) 108 (90 Base) MCG/ACT inhaler [id number]  Inhale 2 puffs into the lungs every 4 (four) hours as needed for shortness of breath or wheezing. [provider]  Active Self, Pharmacy Records  ARIPiprazole  (ABILIFY ) 15 MG tablet [id number]  Take 15 mg by mouth daily. [provider]  Active Self, Pharmacy Records  aspirin  EC 81 MG tablet [id number]  Take 1 tablet (81 mg total) by mouth daily. Swallow whole. Revankar, Jennifer SAUNDERS, MD  Active Self, Pharmacy Records  atorvastatin  (LIPITOR) 40 MG tablet [id number]  Take 1 tablet (40 mg total) by mouth at bedtime. Carlin Delon BROCKS, NP  Expired 09/16/23 2359 Self, Pharmacy Records  Calcium  Carb-Cholecalciferol (OYSTER SHELL CALCIUM  250+D) 250-3.125 MG-MCG TABS [id number]  Take  1 tablet by mouth daily. [provider]  Active Self, Pharmacy Records  Cholecalciferol (VITAMIN D-3) 125 MCG (5000 UT) TABS [id number]  Take 5,000 Units by mouth daily. [provider]  Active Self, Pharmacy Records  citalopram  (CELEXA ) 40 MG tablet [id number]  Take 40 mg by mouth daily. [provider]  Active Self, Pharmacy Records  clotrimazole (LOTRIMIN) 1 % cream  [id number]  Apply topically 2 (two) times daily. [provider]  Active Self, Pharmacy Records  diclofenac (VOLTAREN) 75 MG EC tablet [id number]  Take 75 mg by mouth 2 (two) times daily. [provider]  Active Self, Pharmacy Records  fluticasone [facility]) 50 MCG/ACT nasal spray [id number]  Place 2 sprays into both nostrils 2 (two) times daily. [provider]  Active Self, Pharmacy Records  gabapentin  (NEURONTIN ) 400 MG capsule [id number]  Take 400 mg by mouth 3 (three) times daily. [provider]  Active Self, Pharmacy Records  HYDROcodone -acetaminophen  [facility]) 10-325 MG tablet [id number]  Take 1 tablet by mouth 3 (three) times daily as needed for moderate pain (pain score 4-6) or severe pain (pain score 7-10). [provider]  Active   ipratropium-albuterol  (DUONEB) 0.5-2.5 (3) MG/3ML SOLN [id number]  SMARTSIG:1 Vial(s) Every 6 Hours [provider]  Active Self, Pharmacy Records  isosorbide  mononitrate (IMDUR ) 30 MG 24 hr tablet [id number]  Take 0.5 tablets (15 mg total) by mouth daily. Revankar, Jennifer SAUNDERS, MD  Active   lidocaine  (LIDODERM ) 5 % [id number]  Place 1 patch onto the skin daily. [provider]  Active Self, Pharmacy Records  losartan  (COZAAR ) 25 MG tablet [id number]  Take 25 mg by mouth daily. [provider]  Active Self, Pharmacy Records  methocarbamol  (ROBAXIN ) 500 MG tablet [id number]  Take 500 mg by mouth 2 (two) times daily. [provider]  Active Self, Pharmacy Records  Multiple Vitamin (MULTIVITAMIN ADULT PO) [id number]  Take 1 tablet by mouth daily. [provider]  Active Self, Pharmacy Records  naloxone [facility]) nasal spray 4 mg/0.1 mL [id number]  Place 1 spray into the nose once. [provider]  Active Self, Pharmacy Records  nitroGLYCERIN  (NITROSTAT ) 0.4 MG SL tablet [id number]  Place 1 tablet (0.4 mg total) under the tongue every 5 (five) minutes as needed for chest pain. Carlin Delon BROCKS, NP  Active Self, Pharmacy Records           Med Note (GARNER, TIFFANY L   Thu Sep 16, 2023 10:13 AM)    ondansetron  (ZOFRAN -ODT) 4 MG disintegrating tablet [id number]  Take 4 mg by mouth every 6 (six) hours as needed. [provider]  Active Self, Pharmacy Records           Med Note (GARNER, TIFFANY L   Thu Sep 16, 2023 10:13 AM)    PREMARIN vaginal cream [id number]  Place 0.5 g vaginally 2 (two) times a week. Apply Wed and Sun [provider]  Active Self, Pharmacy Records  QUEtiapine  (SEROQUEL ) 300 MG tablet [id number]  Take 300 mg by mouth at bedtime. [provider]  Active Self, Pharmacy Records  Discontinued 12/28/22 0900 (Dose change)   torsemide  (DEMADEX ) 20 MG tablet [id number]  Take 3 tablets (60 mg total) by mouth as directed. Take 40 mg ( 2 tabs) in am and 20 mg ( 1 tab) in afternoon. Carlin Delon BROCKS, NP  Active Self, Pharmacy Records  traZODone  (DESYREL ) 50 MG tablet [id number]  Take 100 mg by mouth at bedtime. [provider]  Active Self, Pharmacy  Records  TRELEGY ELLIPTA 200-62.5-25 MCG/ACT AEPB [id number]  Inhale 1 puff into the lungs daily. [provider]  Active Self, Pharmacy Records            Recommendation:   Continue Current Plan of Care  Follow Up Plan:   Telephone follow-up 10/07/2023 at 11:30 am  Olam Ally, MSW, LCSW [place]  [facility] Licensed Clinical Social Worker Direct Dial: [phone]

## 2023-09-21 DIAGNOSIS — N179 Acute kidney failure, unspecified: Secondary | ICD-10-CM | POA: Diagnosis not present

## 2023-09-21 DIAGNOSIS — L97812 Non-pressure chronic ulcer of other part of right lower leg with fat layer exposed: Secondary | ICD-10-CM | POA: Diagnosis not present

## 2023-09-21 DIAGNOSIS — I5032 Chronic diastolic (congestive) heart failure: Secondary | ICD-10-CM | POA: Diagnosis not present

## 2023-09-21 DIAGNOSIS — F319 Bipolar disorder, unspecified: Secondary | ICD-10-CM | POA: Diagnosis not present

## 2023-09-21 DIAGNOSIS — I959 Hypotension, unspecified: Secondary | ICD-10-CM | POA: Diagnosis not present

## 2023-09-21 DIAGNOSIS — I872 Venous insufficiency (chronic) (peripheral): Secondary | ICD-10-CM | POA: Diagnosis not present

## 2023-09-21 DIAGNOSIS — I700 Atherosclerosis of aorta: Secondary | ICD-10-CM | POA: Diagnosis not present

## 2023-09-21 DIAGNOSIS — I110 Hypertensive heart disease with heart failure: Secondary | ICD-10-CM | POA: Diagnosis not present

## 2023-09-21 DIAGNOSIS — L97213 Non-pressure chronic ulcer of right calf with necrosis of muscle: Secondary | ICD-10-CM | POA: Diagnosis not present

## 2023-09-27 DIAGNOSIS — M8580 Other specified disorders of bone density and structure, unspecified site: Secondary | ICD-10-CM | POA: Diagnosis not present

## 2023-09-27 DIAGNOSIS — N898 Other specified noninflammatory disorders of vagina: Secondary | ICD-10-CM | POA: Diagnosis not present

## 2023-09-27 DIAGNOSIS — Z6828 Body mass index (BMI) 28.0-28.9, adult: Secondary | ICD-10-CM | POA: Diagnosis not present

## 2023-09-27 DIAGNOSIS — N760 Acute vaginitis: Secondary | ICD-10-CM | POA: Diagnosis not present

## 2023-09-27 DIAGNOSIS — B9689 Other specified bacterial agents as the cause of diseases classified elsewhere: Secondary | ICD-10-CM | POA: Diagnosis not present

## 2023-09-28 ENCOUNTER — Telehealth: Payer: Self-pay | Admitting: Cardiology

## 2023-09-28 DIAGNOSIS — J9611 Chronic respiratory failure with hypoxia: Secondary | ICD-10-CM | POA: Diagnosis not present

## 2023-09-28 DIAGNOSIS — E785 Hyperlipidemia, unspecified: Secondary | ICD-10-CM | POA: Diagnosis not present

## 2023-09-28 DIAGNOSIS — G894 Chronic pain syndrome: Secondary | ICD-10-CM | POA: Diagnosis not present

## 2023-09-28 DIAGNOSIS — L97213 Non-pressure chronic ulcer of right calf with necrosis of muscle: Secondary | ICD-10-CM | POA: Diagnosis not present

## 2023-09-28 DIAGNOSIS — I340 Nonrheumatic mitral (valve) insufficiency: Secondary | ICD-10-CM | POA: Diagnosis not present

## 2023-09-28 DIAGNOSIS — E782 Mixed hyperlipidemia: Secondary | ICD-10-CM | POA: Diagnosis not present

## 2023-09-28 DIAGNOSIS — R079 Chest pain, unspecified: Secondary | ICD-10-CM | POA: Diagnosis not present

## 2023-09-28 DIAGNOSIS — Z79899 Other long term (current) drug therapy: Secondary | ICD-10-CM | POA: Diagnosis not present

## 2023-09-28 DIAGNOSIS — I5032 Chronic diastolic (congestive) heart failure: Secondary | ICD-10-CM | POA: Diagnosis not present

## 2023-09-28 DIAGNOSIS — I200 Unstable angina: Secondary | ICD-10-CM | POA: Diagnosis not present

## 2023-09-28 DIAGNOSIS — E7800 Pure hypercholesterolemia, unspecified: Secondary | ICD-10-CM | POA: Diagnosis not present

## 2023-09-28 DIAGNOSIS — I700 Atherosclerosis of aorta: Secondary | ICD-10-CM | POA: Diagnosis not present

## 2023-09-28 DIAGNOSIS — J449 Chronic obstructive pulmonary disease, unspecified: Secondary | ICD-10-CM | POA: Diagnosis not present

## 2023-09-28 DIAGNOSIS — J431 Panlobular emphysema: Secondary | ICD-10-CM | POA: Diagnosis not present

## 2023-09-28 DIAGNOSIS — Z9981 Dependence on supplemental oxygen: Secondary | ICD-10-CM | POA: Diagnosis not present

## 2023-09-28 DIAGNOSIS — I25110 Atherosclerotic heart disease of native coronary artery with unstable angina pectoris: Secondary | ICD-10-CM | POA: Diagnosis not present

## 2023-09-28 DIAGNOSIS — I2510 Atherosclerotic heart disease of native coronary artery without angina pectoris: Secondary | ICD-10-CM | POA: Diagnosis not present

## 2023-09-28 DIAGNOSIS — N179 Acute kidney failure, unspecified: Secondary | ICD-10-CM | POA: Diagnosis not present

## 2023-09-28 DIAGNOSIS — D649 Anemia, unspecified: Secondary | ICD-10-CM | POA: Diagnosis not present

## 2023-09-28 DIAGNOSIS — L97812 Non-pressure chronic ulcer of other part of right lower leg with fat layer exposed: Secondary | ICD-10-CM | POA: Diagnosis not present

## 2023-09-28 DIAGNOSIS — G4733 Obstructive sleep apnea (adult) (pediatric): Secondary | ICD-10-CM | POA: Diagnosis not present

## 2023-09-28 DIAGNOSIS — I872 Venous insufficiency (chronic) (peripheral): Secondary | ICD-10-CM | POA: Diagnosis not present

## 2023-09-28 DIAGNOSIS — R0789 Other chest pain: Secondary | ICD-10-CM | POA: Diagnosis not present

## 2023-09-28 DIAGNOSIS — F319 Bipolar disorder, unspecified: Secondary | ICD-10-CM | POA: Diagnosis not present

## 2023-09-28 DIAGNOSIS — M47816 Spondylosis without myelopathy or radiculopathy, lumbar region: Secondary | ICD-10-CM | POA: Diagnosis not present

## 2023-09-28 DIAGNOSIS — E669 Obesity, unspecified: Secondary | ICD-10-CM | POA: Diagnosis not present

## 2023-09-28 DIAGNOSIS — I959 Hypotension, unspecified: Secondary | ICD-10-CM | POA: Diagnosis not present

## 2023-09-28 DIAGNOSIS — I110 Hypertensive heart disease with heart failure: Secondary | ICD-10-CM | POA: Diagnosis not present

## 2023-09-28 DIAGNOSIS — I10 Essential (primary) hypertension: Secondary | ICD-10-CM | POA: Diagnosis not present

## 2023-09-28 NOTE — Telephone Encounter (Signed)
 Pt is currently at [facility] ED for evaluation.

## 2023-09-28 NOTE — Telephone Encounter (Signed)
   Pt c/o of Chest Pain: STAT if active CP, including tightness, pressure, jaw pain, radiating pain to shoulder/upper arm/back, CP unrelieved by Nitro. Symptoms reported of SOB, nausea, vomiting, sweating.  1. Are you having CP right now? yes   2. Are you experiencing any other symptoms (ex. SOB, nausea, vomiting, sweating)? no   3. Is your CP continuous or coming and going? Coming and going    4. Have you taken Nitroglycerin ? no   5. How long have you been experiencing CP? Today at around 10:15 am     6. If NO CP at time of call then end call with telling Pt to call back or call 911 if Chest pain returns prior to return call from triage team.  Pt is at pain management and they are calling an ambulance to take her to [facility]

## 2023-09-29 ENCOUNTER — Other Ambulatory Visit: Payer: Self-pay

## 2023-09-29 ENCOUNTER — Inpatient Hospital Stay (HOSPITAL_COMMUNITY)
Admission: EM | Admit: 2023-09-29 | Discharge: 2023-09-30 | DRG: 322 | Disposition: A | Discharge status: Home Health | Source: Other Acute Inpatient Hospital | Attending: Cardiovascular Disease | Admitting: Cardiovascular Disease

## 2023-09-29 ENCOUNTER — Telehealth: Payer: Self-pay

## 2023-09-29 ENCOUNTER — Encounter (HOSPITAL_COMMUNITY)
Admission: EM | Disposition: A | Discharge status: Home Health | Payer: Self-pay | Source: Other Acute Inpatient Hospital

## 2023-09-29 ENCOUNTER — Encounter (HOSPITAL_COMMUNITY): Payer: Self-pay

## 2023-09-29 DIAGNOSIS — F319 Bipolar disorder, unspecified: Secondary | ICD-10-CM | POA: Diagnosis present

## 2023-09-29 DIAGNOSIS — Z6829 Body mass index (BMI) 29.0-29.9, adult: Secondary | ICD-10-CM | POA: Diagnosis not present

## 2023-09-29 DIAGNOSIS — E785 Hyperlipidemia, unspecified: Secondary | ICD-10-CM | POA: Diagnosis present

## 2023-09-29 DIAGNOSIS — I25110 Atherosclerotic heart disease of native coronary artery with unstable angina pectoris: Secondary | ICD-10-CM | POA: Diagnosis not present

## 2023-09-29 DIAGNOSIS — I10 Essential (primary) hypertension: Secondary | ICD-10-CM | POA: Diagnosis not present

## 2023-09-29 DIAGNOSIS — Z5941 Food insecurity: Secondary | ICD-10-CM

## 2023-09-29 DIAGNOSIS — Z7982 Long term (current) use of aspirin: Secondary | ICD-10-CM

## 2023-09-29 DIAGNOSIS — Z7951 Long term (current) use of inhaled steroids: Secondary | ICD-10-CM

## 2023-09-29 DIAGNOSIS — J449 Chronic obstructive pulmonary disease, unspecified: Secondary | ICD-10-CM | POA: Diagnosis present

## 2023-09-29 DIAGNOSIS — Z87891 Personal history of nicotine dependence: Secondary | ICD-10-CM | POA: Diagnosis not present

## 2023-09-29 DIAGNOSIS — I340 Nonrheumatic mitral (valve) insufficiency: Secondary | ICD-10-CM | POA: Diagnosis not present

## 2023-09-29 DIAGNOSIS — I2510 Atherosclerotic heart disease of native coronary artery without angina pectoris: Secondary | ICD-10-CM | POA: Diagnosis not present

## 2023-09-29 DIAGNOSIS — Z7902 Long term (current) use of antithrombotics/antiplatelets: Secondary | ICD-10-CM | POA: Diagnosis not present

## 2023-09-29 DIAGNOSIS — R079 Chest pain, unspecified: Secondary | ICD-10-CM | POA: Diagnosis present

## 2023-09-29 DIAGNOSIS — I200 Unstable angina: Secondary | ICD-10-CM | POA: Diagnosis not present

## 2023-09-29 DIAGNOSIS — E669 Obesity, unspecified: Secondary | ICD-10-CM | POA: Diagnosis present

## 2023-09-29 DIAGNOSIS — Z79891 Long term (current) use of opiate analgesic: Secondary | ICD-10-CM

## 2023-09-29 DIAGNOSIS — Z79899 Other long term (current) drug therapy: Secondary | ICD-10-CM | POA: Diagnosis not present

## 2023-09-29 DIAGNOSIS — J431 Panlobular emphysema: Secondary | ICD-10-CM | POA: Diagnosis present

## 2023-09-29 DIAGNOSIS — Z955 Presence of coronary angioplasty implant and graft: Principal | ICD-10-CM

## 2023-09-29 DIAGNOSIS — G894 Chronic pain syndrome: Secondary | ICD-10-CM | POA: Diagnosis present

## 2023-09-29 DIAGNOSIS — Z8249 Family history of ischemic heart disease and other diseases of the circulatory system: Secondary | ICD-10-CM | POA: Diagnosis not present

## 2023-09-29 DIAGNOSIS — I110 Hypertensive heart disease with heart failure: Secondary | ICD-10-CM | POA: Diagnosis present

## 2023-09-29 DIAGNOSIS — Z9981 Dependence on supplemental oxygen: Secondary | ICD-10-CM

## 2023-09-29 DIAGNOSIS — Z888 Allergy status to other drugs, medicaments and biological substances status: Secondary | ICD-10-CM

## 2023-09-29 DIAGNOSIS — I5032 Chronic diastolic (congestive) heart failure: Secondary | ICD-10-CM | POA: Diagnosis present

## 2023-09-29 DIAGNOSIS — I700 Atherosclerosis of aorta: Secondary | ICD-10-CM | POA: Diagnosis not present

## 2023-09-29 DIAGNOSIS — G4733 Obstructive sleep apnea (adult) (pediatric): Secondary | ICD-10-CM | POA: Diagnosis present

## 2023-09-29 DIAGNOSIS — I5189 Other ill-defined heart diseases: Secondary | ICD-10-CM

## 2023-09-29 DIAGNOSIS — Z880 Allergy status to penicillin: Secondary | ICD-10-CM | POA: Diagnosis not present

## 2023-09-29 DIAGNOSIS — E782 Mixed hyperlipidemia: Secondary | ICD-10-CM | POA: Diagnosis present

## 2023-09-29 DIAGNOSIS — Z5982 Transportation insecurity: Secondary | ICD-10-CM

## 2023-09-29 DIAGNOSIS — I2582 Chronic total occlusion of coronary artery: Secondary | ICD-10-CM | POA: Diagnosis present

## 2023-09-29 LAB — POCT ACTIVATED CLOTTING TIME
Activated Clotting Time: 268 s
Activated Clotting Time: 291 s
Activated Clotting Time: 245 s
Activated Clotting Time: 210 s
Activated Clotting Time: 193 s

## 2023-09-29 MED ORDER — FAMOTIDINE IN NACL 20-0.9 MG/50ML-% IV SOLN
INTRAVENOUS | Status: DC | PRN
  Administered 2023-09-29: 20 mg via INTRAVENOUS

## 2023-09-29 MED ORDER — ATORVASTATIN CALCIUM 40 MG PO TABS
40.0000 mg | ORAL_TABLET | Freq: Every day | ORAL | Status: DC
  Administered 2023-09-29: 40 mg via ORAL
  Filled 2023-09-29: qty 1

## 2023-09-29 MED ORDER — GABAPENTIN 400 MG PO CAPS
400.0000 mg | ORAL_CAPSULE | Freq: Three times a day (TID) | ORAL | Status: DC
  Administered 2023-09-30: 400 mg via ORAL
  Filled 2023-09-29: qty 1

## 2023-09-29 MED ORDER — CLOPIDOGREL BISULFATE 75 MG PO TABS
75.0000 mg | ORAL_TABLET | Freq: Every day | ORAL | Status: DC
  Administered 2023-09-30: 75 mg via ORAL
  Filled 2023-09-29: qty 1

## 2023-09-29 MED ORDER — TORSEMIDE 20 MG PO TABS: 40.0000 mg | ORAL_TABLET | Freq: Every day | ORAL | Status: DC

## 2023-09-29 MED ORDER — LABETALOL HCL 5 MG/ML IV SOLN
10.0000 mg | INTRAVENOUS | Status: AC | PRN
Start: 2023-09-29 — End: 2023-09-30

## 2023-09-29 MED ORDER — FENTANYL CITRATE (PF) 100 MCG/2ML IJ SOLN
INTRAMUSCULAR | Status: DC | PRN
  Administered 2023-09-29 (×2): 25 ug via INTRAVENOUS

## 2023-09-29 MED ORDER — IOHEXOL 350 MG/ML SOLN
INTRAVENOUS | Status: DC | PRN
Start: 2023-09-29 — End: 2023-09-29
  Administered 2023-09-29: 100 mL via INTRA_ARTERIAL

## 2023-09-29 MED ORDER — BUDESON-GLYCOPYRROL-FORMOTEROL 160-9-4.8 MCG/ACT IN AERO
2.0000 | INHALATION_SPRAY | Freq: Two times a day (BID) | RESPIRATORY_TRACT | Status: DC
  Administered 2023-09-30: 2 via RESPIRATORY_TRACT
  Filled 2023-09-29: qty 5.9

## 2023-09-29 MED ORDER — NITROGLYCERIN 0.4 MG SL SUBL: 0.4000 mg | SUBLINGUAL_TABLET | SUBLINGUAL | Status: DC | PRN

## 2023-09-29 MED ORDER — ALBUTEROL SULFATE (2.5 MG/3ML) 0.083% IN NEBU: 3.0000 mL | INHALATION_SOLUTION | RESPIRATORY_TRACT | Status: DC | PRN

## 2023-09-29 MED ORDER — CITALOPRAM HYDROBROMIDE 20 MG PO TABS
40.0000 mg | ORAL_TABLET | Freq: Every day | ORAL | Status: DC
  Administered 2023-09-30: 40 mg via ORAL
  Filled 2023-09-29: qty 2

## 2023-09-29 MED ORDER — SODIUM CHLORIDE 0.9% FLUSH: 3.0000 mL | INTRAVENOUS | Status: DC | PRN

## 2023-09-29 MED ORDER — VERAPAMIL HCL 2.5 MG/ML IV SOLN
INTRAVENOUS | Status: AC
  Filled 2023-09-29: qty 2

## 2023-09-29 MED ORDER — LIDOCAINE HCL (PF) 1 % IJ SOLN
INTRAMUSCULAR | Status: DC | PRN
  Administered 2023-09-29: 5 mL via INTRADERMAL
  Administered 2023-09-29: 2 mL via INTRADERMAL

## 2023-09-29 MED ORDER — QUETIAPINE FUMARATE 50 MG PO TABS
300.0000 mg | ORAL_TABLET | Freq: Every day | ORAL | Status: DC
  Administered 2023-09-29: 300 mg via ORAL
  Filled 2023-09-29: qty 6

## 2023-09-29 MED ORDER — CLOPIDOGREL BISULFATE 300 MG PO TABS
ORAL_TABLET | ORAL | Status: DC | PRN
  Administered 2023-09-29: 600 mg via ORAL

## 2023-09-29 MED ORDER — SODIUM CHLORIDE 0.9 % IV SOLN: 250.0000 mL | INTRAVENOUS | Status: DC | PRN

## 2023-09-29 MED ORDER — HEPARIN SODIUM (PORCINE) 1000 UNIT/ML IJ SOLN
INTRAMUSCULAR | Status: AC
  Filled 2023-09-29: qty 10

## 2023-09-29 MED ORDER — METHOCARBAMOL 500 MG PO TABS
500.0000 mg | ORAL_TABLET | Freq: Two times a day (BID) | ORAL | Status: DC
  Administered 2023-09-29 – 2023-09-30 (×2): 500 mg via ORAL
  Filled 2023-09-29 (×2): qty 1

## 2023-09-29 MED ORDER — ACETAMINOPHEN 325 MG PO TABS: 650.0000 mg | ORAL_TABLET | ORAL | Status: DC | PRN

## 2023-09-29 MED ORDER — FENTANYL CITRATE (PF) 100 MCG/2ML IJ SOLN
INTRAMUSCULAR | Status: AC
Start: 2023-09-29 — End: 2023-09-29
  Filled 2023-09-29: qty 2

## 2023-09-29 MED ORDER — ONDANSETRON HCL 4 MG/2ML IJ SOLN: 4.0000 mg | Freq: Four times a day (QID) | INTRAMUSCULAR | Status: DC | PRN

## 2023-09-29 MED ORDER — HEPARIN (PORCINE) IN NACL 1000-0.9 UT/500ML-% IV SOLN
INTRAVENOUS | Status: DC | PRN
  Administered 2023-09-29 (×3): 500 mL

## 2023-09-29 MED ORDER — LIDOCAINE HCL (PF) 1 % IJ SOLN
INTRAMUSCULAR | Status: AC
  Filled 2023-09-29: qty 30

## 2023-09-29 MED ORDER — MIDAZOLAM HCL 2 MG/2ML IJ SOLN
INTRAMUSCULAR | Status: AC
  Filled 2023-09-29: qty 2

## 2023-09-29 MED ORDER — TRAZODONE HCL 100 MG PO TABS
100.0000 mg | ORAL_TABLET | Freq: Every day | ORAL | Status: DC
  Administered 2023-09-29: 100 mg via ORAL
  Filled 2023-09-29: qty 1

## 2023-09-29 MED ORDER — MIDAZOLAM HCL 2 MG/2ML IJ SOLN
INTRAMUSCULAR | Status: DC | PRN
  Administered 2023-09-29 (×2): 1 mg via INTRAVENOUS

## 2023-09-29 MED ORDER — CLOPIDOGREL BISULFATE 300 MG PO TABS
ORAL_TABLET | ORAL | Status: AC
Start: 2023-09-29 — End: 2023-09-29
  Filled 2023-09-29: qty 2

## 2023-09-29 MED ORDER — FAMOTIDINE IN NACL 20-0.9 MG/50ML-% IV SOLN
INTRAVENOUS | Status: AC
  Filled 2023-09-29: qty 50

## 2023-09-29 MED ORDER — ISOSORBIDE MONONITRATE ER 30 MG PO TB24
15.0000 mg | ORAL_TABLET | Freq: Every day | ORAL | Status: DC
  Administered 2023-09-30: 15 mg via ORAL
  Filled 2023-09-29: qty 1

## 2023-09-29 MED ORDER — SODIUM CHLORIDE 0.9% FLUSH
3.0000 mL | Freq: Two times a day (BID) | INTRAVENOUS | Status: DC
Start: 2023-09-30 — End: 2023-09-30
  Administered 2023-09-29: 3 mL via INTRAVENOUS

## 2023-09-29 MED ORDER — LOSARTAN POTASSIUM 25 MG PO TABS
25.0000 mg | ORAL_TABLET | Freq: Every day | ORAL | Status: DC
  Administered 2023-09-30: 25 mg via ORAL
  Filled 2023-09-29: qty 1

## 2023-09-29 MED ORDER — VERAPAMIL HCL 2.5 MG/ML IV SOLN
INTRAVENOUS | Status: DC | PRN
  Administered 2023-09-29: 10 mL via INTRA_ARTERIAL

## 2023-09-29 MED ORDER — FREE WATER: 500.0000 mL | Freq: Once | Status: DC

## 2023-09-29 MED ORDER — HEPARIN SODIUM (PORCINE) 1000 UNIT/ML IJ SOLN
INTRAMUSCULAR | Status: DC | PRN
  Administered 2023-09-29: 6000 [IU] via INTRAVENOUS
  Administered 2023-09-29: 4000 [IU] via INTRAVENOUS

## 2023-09-29 MED ORDER — ASPIRIN 81 MG PO TBEC
81.0000 mg | DELAYED_RELEASE_TABLET | Freq: Every day | ORAL | Status: DC
  Administered 2023-09-30: 81 mg via ORAL
  Filled 2023-09-29: qty 1

## 2023-09-29 MED ORDER — HYDRALAZINE HCL 20 MG/ML IJ SOLN
10.0000 mg | INTRAMUSCULAR | Status: AC | PRN
Start: 2023-09-29 — End: 2023-09-30

## 2023-09-29 NOTE — Progress Notes (Signed)
 Site area: R fem artery Site Prior to Removal:  Level 0 Pressure Applied For: Manual:   yes Patient Status During Pull:  stable Post Pull Site:  Level 0 Post Pull Instructions Given:  yes with teach back Post Pull Pulses Present: +1 DP/PT Dressing Applied:  gauze and tegaderm Bedrest begins @ 2145

## 2023-09-29 NOTE — Interval H&P Note (Signed)
 History and Physical Interval Note:  09/29/2023 4:32 PM  Tracy Oconnor  has presented today for surgery, with the diagnosis of nstemi.  The various methods of treatment have been discussed with the patient and family. After consideration of risks, benefits and other options for treatment, the patient has consented to  Procedure(s): LEFT HEART CATH AND CORONARY ANGIOGRAPHY (N/A) as a surgical intervention.  The patient's history has been reviewed, patient examined, no change in status, stable for surgery.  I have reviewed the patient's chart and labs.  Questions were answered to the patient's satisfaction.    Cath Lab Visit (complete for each Cath Lab visit)  Clinical Evaluation Leading to the Procedure:   ACS: No.  Non-ACS:    Anginal Classification: CCS III  Anti-ischemic medical therapy: Minimal Therapy (1 class of medications)  Non-Invasive Test Results: No non-invasive testing performed  Prior CABG: No previous CABG         Lonni Cash

## 2023-09-29 NOTE — H&P (Signed)
 Cardiology Admission History and Physical   Patient ID: [name] MRN: [id number]; DOB: [birth year]   Admission date: 09/29/2023  PCP:  Trinidad Hun, MD   [facility] Providers Cardiologist:  Jennifer [name], MD      Chief Complaint:  Chest pain  Patient Profile: Tracy Oconnor is a 63 y.o. female with past medical history of CAD with CTO of distal RCA, HFpEF, COPD on O2, OSA, bipolar, hypertension, hyperlipidemia who is being seen 09/29/2023 for the evaluation of chest pain, transferred from [facility].  History of Present Illness: Tracy Oconnor is a 63 year old female with past medical history noted above.  She has been followed by Dr. Crape as an outpatient.  She will establish care with cardiology 07/2022 for evaluation of dyspnea on exertion from her pulmonologist.  She had had a CT of her chest concerning for CAD.  Underwent Lexi scan which showed reversible ischemia and underwent left heart cath on 08/2022 which showed CTO of her RCA with left-to-right collaterals with recommendations for medical therapy.  Presented to [facility] on 8/26 with complaints of left-sided chest pain which initially was noted to be reproducible and improved with aspirin  and sublingual nitroglycerin  as well as IV Toradol.  EKG sinus rhythm, 67 bpm with no significant ST/T wave abnormalities.  Troponin I negative x 2, proBNP 872.  Cardiology initially consulted with recommendations for CT angio which was negative for PE.  Developed recurrent chest pain which was nonreproducible and seen by Dr. Liborio with recommendations to start on IV heparin  and transfer to [facility] for further evaluation for cardiac catheterization given she reported symptoms were similar to prior angina.  Did undergo echocardiogram with normal BiV function with mild to moderate MR.   Past Medical History:  Diagnosis Date   Aortic atherosclerosis (HCC) 07/29/2022   Atherosclerosis    Bilateral carpal  tunnel syndrome 01/30/2021   Bipolar disorder (HCC)    Cervical radiculopathy 01/30/2021   Chronic pain syndrome 07/06/2023   COPD (chronic obstructive pulmonary disease) (HCC)    Coronary artery disease 07/29/2022   Diastolic dysfunction 06/07/2023   DOE (dyspnea on exertion) 07/29/2022   Edema    Elevated lipoprotein A level    2024   Enteritis 12/17/2020   Fatigue    Hyperlipidemia    Hypertension    Hyponatremia 06/07/2023   Intermittent chest pain    Long term (current) use of opiate analgesic 07/06/2023   Mitral regurgitation    Multiple open wounds of lower extremity 06/07/2023   Murmur    Obesity (BMI 30.0-34.9) 07/29/2022   OSA (obstructive sleep apnea)    Palpitations    Spondylosis 07/06/2023   Past Surgical History:  Procedure Laterality Date   CHOLECYSTECTOMY     CORONARY PRESSURE/FFR STUDY N/A 08/13/2022   Procedure: CORONARY PRESSURE/FFR STUDY;  Surgeon: Mady Bruckner, MD;  Location: [facility] INVASIVE CV LAB;  Service: Cardiovascular;  Laterality: N/A;   LEFT HEART CATH AND CORONARY ANGIOGRAPHY N/A 08/13/2022   Procedure: LEFT HEART CATH AND CORONARY ANGIOGRAPHY;  Surgeon: Mady Bruckner, MD;  Location: [facility] INVASIVE CV LAB;  Service: Cardiovascular;  Laterality: N/A;   TONSILLECTOMY     TUBAL LIGATION       Medications Prior to Admission: Prior to Admission medications   Medication Sig Start Date End Date Taking? Authorizing Provider  albuterol  (VENTOLIN  HFA) 108 (90 Base) MCG/ACT inhaler Inhale 2 puffs into the lungs every 4 (four) hours as needed for shortness of breath or wheezing.  [provider]  ARIPiprazole  (ABILIFY ) 15 MG tablet Take 15 mg by mouth daily. 02/25/18   [provider]  aspirin  EC 81 MG tablet Take 1 tablet (81 mg total) by mouth daily. Swallow whole. 07/29/22   Revankar, Jennifer SAUNDERS, MD  atorvastatin  (LIPITOR) 40 MG tablet Take 1 tablet (40 mg total) by mouth at bedtime. 04/21/23 09/16/23  Carlin Delon BROCKS, NP  Calcium   Carb-Cholecalciferol (OYSTER SHELL CALCIUM  250+D) 250-3.125 MG-MCG TABS Take 1 tablet by mouth daily.    [provider]  Cholecalciferol (VITAMIN D-3) 125 MCG (5000 UT) TABS Take 5,000 Units by mouth daily.    [provider]  citalopram  (CELEXA ) 40 MG tablet Take 40 mg by mouth daily. 01/13/18   [provider]  clotrimazole (LOTRIMIN) 1 % cream Apply topically 2 (two) times daily. 05/12/23   [provider]  diclofenac (VOLTAREN) 75 MG EC tablet Take 75 mg by mouth 2 (two) times daily. 10/11/20   [provider]  fluticasone (FLONASE) 50 MCG/ACT nasal spray Place 2 sprays into both nostrils 2 (two) times daily. 01/17/18   [provider]  gabapentin  (NEURONTIN ) 400 MG capsule Take 400 mg by mouth 3 (three) times daily. 10/14/20   [provider]  HYDROcodone -acetaminophen  (NORCO) 10-325 MG tablet Take 1 tablet by mouth 3 (three) times daily as needed for moderate pain (pain score 4-6) or severe pain (pain score 7-10). 06/24/23   [provider]  ipratropium-albuterol  (DUONEB) 0.5-2.5 (3) MG/3ML SOLN SMARTSIG:1 Vial(s) Every 6 Hours 01/29/23   [provider]  isosorbide  mononitrate (IMDUR ) 30 MG 24 hr tablet Take 0.5 tablets (15 mg total) by mouth daily. 09/16/23   Revankar, Jennifer SAUNDERS, MD  lidocaine  (LIDODERM ) 5 % Place 1 patch onto the skin daily. 08/27/21   [provider]  losartan  (COZAAR ) 25 MG tablet Take 25 mg by mouth daily. 09/09/21   [provider]  methocarbamol  (ROBAXIN ) 500 MG tablet Take 500 mg by mouth 2 (two) times daily. 10/21/22   [provider]  Multiple Vitamin (MULTIVITAMIN ADULT PO) Take 1 tablet by mouth daily.    [provider]  naloxone [facility]) nasal spray 4 mg/0.1 mL Place 1 spray into the nose once. 01/06/23   [provider]  nitroGLYCERIN  (NITROSTAT ) 0.4 MG SL tablet Place 1 tablet (0.4 mg total) under the tongue every 5 (five) minutes as needed for chest  pain. 02/18/23 12/03/23  Carlin Delon BROCKS, NP  ondansetron  (ZOFRAN -ODT) 4 MG disintegrating tablet Take 4 mg by mouth every 6 (six) hours as needed. 04/20/23   [provider]  PREMARIN vaginal cream Place 0.5 g vaginally 2 (two) times a week. Apply Wed and Sun 05/12/23   [provider]  QUEtiapine  (SEROQUEL ) 300 MG tablet Take 300 mg by mouth at bedtime. 10/30/20   [provider]  torsemide  (DEMADEX ) 20 MG tablet Take 3 tablets (60 mg total) by mouth as directed. Take 40 mg ( 2 tabs) in am and 20 mg ( 1 tab) in afternoon. 04/05/23   Carlin Delon BROCKS, NP  traZODone  (DESYREL ) 50 MG tablet Take 100 mg by mouth at bedtime. 09/06/21   [provider]  DOMINIC BECK 200-62.5-25 MCG/ACT AEPB Inhale 1 puff into the lungs daily. 02/04/22   [provider]  torsemide  (DEMADEX ) 10 MG tablet Take 1 tablet (10 mg total) by mouth as needed (pedal edema). 09/11/22   Carlin Delon BROCKS, NP     Allergies:    Allergies  Allergen  Reactions   Penicillins     Childhood allergy not sure what happens      Jardiance  [Empagliflozin ] Other (See Comments)    Yeast infection    Social History:   Social History   Socioeconomic History   Marital status: Single    Spouse name: Not on file   Number of children: Not on file   Years of education: Not on file   Highest education level: Not on file  Occupational History   Not on file  Tobacco Use   Smoking status: Former    Types: Cigarettes   Smokeless tobacco: Never  Substance and Sexual Activity   Alcohol  use: Not on file   Drug use: Not on file   Sexual activity: Not on file  Other Topics Concern   Not on file  Social History Narrative   Not on file   Social Drivers of Health   Financial Resource Strain: Not on file  Food Insecurity: No Food Insecurity (09/15/2023)   Hunger Vital Sign    Worried About Running Out of Food in the Last Year: Never true    Ran Out of Food in the Last Year: Never true  Recent  Concern: Food Insecurity - Food Insecurity Present (09/07/2023)   Hunger Vital Sign    Worried About Running Out of Food in the Last Year: Often true    Ran Out of Food in the Last Year: Often true  Transportation Needs: Unmet Transportation Needs (09/07/2023)   PRAPARE - Administrator, Civil Service (Medical): Yes    Lack of Transportation (Non-Medical): No  Physical Activity: Not on file  Stress: Not on file  Social Connections: Moderately Isolated (06/08/2023)   Social Connection and Isolation Panel    Frequency of Communication with Friends and Family: More than three times a week    Frequency of Social Gatherings with Friends and Family: Three times a week    Attends Religious Services: Never    Active Member of Clubs or Organizations: No    Attends Engineer, structural: More than 4 times per year    Marital Status: Never married  Intimate Partner Violence: Not At Risk (09/07/2023)   Humiliation, Afraid, Rape, and Kick questionnaire    Fear of Current or Ex-Partner: No    Emotionally Abused: No    Physically Abused: No    Sexually Abused: No     Family History:   The patient's family history includes Breast cancer in her maternal aunt; Cancer in her father; Heart disease in her mother.    ROS:  Please see the history of present illness.  All other ROS reviewed and negative.     Physical Exam/Data: Vitals:   09/29/23 1645 09/29/23 1650 09/29/23 1655 09/29/23 1700  BP: 138/73 118/67 119/65 127/69  Pulse: 64 68 94 70  Resp: (!) 26 (!) 27 14 (!) 31  SpO2: 92% (!) 89% 92% 90%   No intake or output data in the 24 hours ending 09/29/23 1711    09/16/2023   10:16 AM 07/06/2023    9:53 AM 04/05/2023   10:15 AM  Last 3 Weights  Weight (lbs) 152 lb 3.2 oz 160 lb 168 lb  Weight (kg) 69.037 kg 72.576 kg 76.204 kg     There is no height or weight on file to calculate BMI.   Per MD  EKG:  The ECG that was done 09/29/2023 was personally reviewed and demonstrates  sinus rhythm, 60 bpm  Relevant  CV Studies:  Echo at [facility] (in paper chart)  Laboratory Data: High Sensitivity Troponin:  No results for input(s): TROPONINIHS in the last 720 hours.    ChemistryNo results for input(s): NA, K, CL, CO2, GLUCOSE, BUN, CREATININE, CALCIUM , MG, GFRNONAA, GFRAA, ANIONGAP in the last 168 hours.  No results for input(s): PROT, ALBUMIN, AST, ALT, ALKPHOS, BILITOT in the last 168 hours. Lipids No results for input(s): CHOL, TRIG, HDL, LABVLDL, LDLCALC, CHOLHDL in the last 168 hours. HematologyNo results for input(s): WBC, RBC, HGB, HCT, MCV, MCH, MCHC, RDW, PLT in the last 168 hours. Thyroid  No results for input(s): TSH, FREET4 in the last 168 hours. BNPNo results for input(s): BNP, PROBNP in the last 168 hours.  DDimer No results for input(s): DDIMER in the last 168 hours.  Radiology/Studies:  No results found.   Assessment and Plan:  Tracy Oconnor is a 63 y.o. female with past medical history of CAD with CTO of distal RCA, HFpEF, COPD on O2, OSA, bipolar, hypertension, hyperlipidemia who is being seen 09/29/2023 for the evaluation of chest pain, transferred from [facility].  Chest pain concerning for unstable angina CAD known CTO of distal RCA -- Presented to [facility] with left-sided chest pain which was initially resolved with aspirin , sublingual nitroglycerin  and Toradol and felt to be musculoskeletal in nature.  She had a recurrent episode which was similar to previous angina and seen by cardiology with recommendations to start on IV heparin  and transferred to [facility] for further evaluation with cardiac catheterization. -- Further recommendations pending cath -- Continue aspirin , metoprolol 25 mg twice daily, IV heparin   HTN -- continue losartan  25mg  daily, Imdur  30mg  daily  HLD -- continue atorvastatin  40mg  daily  COPD on O2 OSA -- continue home  meds  HFpEF -- Echo at [facility] with normal BiV function -- unclear torsemide  dosing PTA, paperwork from [place] notes 20mg  BID  Chronic pain -- follows in pain clinic outpatient  Needs med rec completed  Code Status: Full Code  Severity of Illness: The appropriate patient status for this patient is INPATIENT. Inpatient status is judged to be reasonable and necessary in order to provide the required intensity of service to ensure the patient's safety. The patient's presenting symptoms, physical exam findings, and initial radiographic and laboratory data in the context of their chronic comorbidities is felt to place them at high risk for further clinical deterioration. Furthermore, it is not anticipated that the patient will be medically stable for discharge from the hospital within 2 midnights of admission.   * I certify that at the point of admission it is my clinical judgment that the patient will require inpatient hospital care spanning beyond 2 midnights from the point of admission due to high intensity of service, high risk for further deterioration and high frequency of surveillance required.*  For questions or updates, please contact [facility] Please consult [email or web address] for contact info under     Signed, Manuelita Rummer, NP  09/29/2023 5:11 PM

## 2023-09-30 ENCOUNTER — Encounter (HOSPITAL_COMMUNITY): Payer: Self-pay | Admitting: Cardiovascular Disease

## 2023-09-30 ENCOUNTER — Other Ambulatory Visit (HOSPITAL_COMMUNITY): Payer: Self-pay

## 2023-09-30 ENCOUNTER — Telehealth: Payer: Self-pay | Admitting: Cardiology

## 2023-09-30 DIAGNOSIS — I200 Unstable angina: Secondary | ICD-10-CM

## 2023-09-30 LAB — CBC
HCT: 29.3 % — ABNORMAL LOW (ref 36.0–46.0)
Hemoglobin: 9.4 g/dL — ABNORMAL LOW (ref 12.0–15.0)
MCH: 29.4 pg (ref 26.0–34.0)
MCHC: 32.1 g/dL (ref 30.0–36.0)
MCV: 91.6 fL (ref 80.0–100.0)
Platelets: 315 K/uL (ref 150–400)
RBC: 3.2 MIL/uL — ABNORMAL LOW (ref 3.87–5.11)
RDW: 16.6 % — ABNORMAL HIGH (ref 11.5–15.5)
WBC: 8 K/uL (ref 4.0–10.5)
nRBC: 0 % (ref 0.0–0.2)

## 2023-09-30 LAB — BASIC METABOLIC PANEL WITH GFR
Anion gap: 8 (ref 5–15)
BUN: 18 mg/dL (ref 8–23)
CO2: 26 mmol/L (ref 22–32)
Calcium: 8.4 mg/dL — ABNORMAL LOW (ref 8.9–10.3)
Chloride: 106 mmol/L (ref 98–111)
Creatinine, Ser: 1.14 mg/dL — ABNORMAL HIGH (ref 0.44–1.00)
GFR, Estimated: 54 mL/min — ABNORMAL LOW (ref >60)
Glucose, Bld: 83 mg/dL (ref 70–99)
Potassium: 3.6 mmol/L (ref 3.5–5.1)
Sodium: 140 mmol/L (ref 135–145)

## 2023-09-30 MED ORDER — METRONIDAZOLE 500 MG PO TABS
500.0000 mg | ORAL_TABLET | Freq: Two times a day (BID) | ORAL | 0 refills | Status: DC
  Filled 2023-09-30: qty 14, 7d supply, fill #0

## 2023-09-30 MED ORDER — TORSEMIDE 20 MG PO TABS
20.0000 mg | ORAL_TABLET | Freq: Two times a day (BID) | ORAL | Status: DC
  Administered 2023-09-30: 20 mg via ORAL
  Filled 2023-09-30: qty 1

## 2023-09-30 MED ORDER — ATORVASTATIN CALCIUM 80 MG PO TABS: 80.0000 mg | ORAL_TABLET | Freq: Every day | ORAL | Status: DC

## 2023-09-30 MED ORDER — TORSEMIDE 20 MG PO TABS: 20.0000 mg | ORAL_TABLET | Freq: Two times a day (BID) | ORAL | Status: DC

## 2023-09-30 MED ORDER — ATORVASTATIN CALCIUM 80 MG PO TABS
80.0000 mg | ORAL_TABLET | Freq: Every day | ORAL | 3 refills | Status: DC
Start: 2023-09-30 — End: 2023-12-22
  Filled 2023-09-30: qty 90, 90d supply, fill #0

## 2023-09-30 MED ORDER — CLOPIDOGREL BISULFATE 75 MG PO TABS
75.0000 mg | ORAL_TABLET | Freq: Every day | ORAL | 11 refills | Status: DC
  Filled 2023-09-30: qty 30, 30d supply, fill #0

## 2023-09-30 NOTE — Discharge Instructions (Signed)

## 2023-09-30 NOTE — Telephone Encounter (Signed)
   Transition of Care Follow-up Phone Call Request    Patient Name: Tracy Oconnor Date of Birth: January 25, 1961 Date of Encounter: 09/30/2023  Primary Care Provider:  Trinidad Hun, MD Primary Cardiologist:  Jennifer [name], MD  Aleaha K Montagna has been scheduled for a transition of care follow up appointment with a HeartCare provider:  Delon Hoover 9/5  Please reach out to [name] within 48 hours of discharge to confirm appointment and review transition of care protocol questionnaire. Anticipated discharge date: 8/28  Manuelita Rummer, NP  09/30/2023, 11:30 AM

## 2023-09-30 NOTE — Progress Notes (Addendum)
 Progress Note  Patient Name: Tracy Oconnor Date of Encounter: 09/30/2023 [facility] Cardiologist: Jennifer [name], MD   Interval Summary    No chest pain overnight, feels well this morning.  Vital Signs Vitals:   09/29/23 2217 09/29/23 2321 09/30/23 0234 09/30/23 0404  BP: 131/71 124/67  95/67  Pulse: 65 73  93  Resp: 20 (!) 23  20  Temp: 97.8 F (36.6 C) 98.7 F (37.1 C)  97.8 F (36.6 C)  TempSrc: Oral Oral Oral Oral  SpO2: 92% 90%  90%  Weight: 69.4 kg       Intake/Output Summary (Last 24 hours) at 09/30/2023 0810 Last data filed at 09/30/2023 0405 Gross per 24 hour  Intake 240 ml  Output 400 ml  Net -160 ml      09/29/2023   10:17 PM 09/16/2023   10:16 AM 07/06/2023    9:53 AM  Last 3 Weights  Weight (lbs) 153 lb 152 lb 3.2 oz 160 lb  Weight (kg) 69.4 kg 69.037 kg 72.576 kg      Telemetry/ECG  Sinus Rhythm - Personally Reviewed  Physical Exam  GEN: Older female, No acute distress.   Neck: No JVD Cardiac: RRR, no murmurs, rubs, or gallops.  Respiratory: Clear to auscultation bilaterally. GI: Soft, nontender, non-distended  MS: No edema Skin: right femoral cath site stable  Assessment & Plan   63 y.o. female with past medical history of CAD with CTO of distal RCA, HFpEF, COPD on O2, OSA, bipolar, hypertension, hyperlipidemia who is being seen 09/29/2023 for the evaluation of chest pain, transferred from [facility].   Chest pain concerning for unstable angina CAD known CTO of distal RCA -- Presented to [facility] with left-sided chest pain which was initially resolved with aspirin , sublingual nitroglycerin  and Toradol and felt to be musculoskeletal in nature.  She had a recurrent episode which was similar to previous angina and seen by cardiology with recommendations to start on IV heparin  and transferred to [facility] for further evaluation with cardiac catheterization. -- Underwent cardiac catheterization 8/27 with known CTO of  RCA left-to-right collaterals, new lesion of 90% in mid LAD treated with PCI/DES x 1.  Recommendations for aspirin /Plavix  for at least 6 months. -- Continue aspirin , plavix , losartan  25mg , Imdur  15mg  daily   HTN -- continue losartan  25mg  daily, Imdur  30mg  daily   HLD -- increase atorvastatin  80mg  daily   COPD on O2 at bedtime OSA -- continue home meds   HFpEF -- Echo at [facility] with normal BiV function -- on torsemide  20mg  BID    Chronic pain -- follows in pain clinic outpatient  Recent dx of yeast infection -- reports being seen at PCP on Monday and meds ordered to her regular pharmacy. Will try to pull meds to [facility] pharmacy so she can get before she leaves    Doing well from cardiac standpoint, lives alone but does report having home health PT. states she has been in need of being set up for home health aide.  Will have cardiac rehab see this morning as well as case management with anticipated discharge later today.  Medical Readiness Date: 09/30/2023    For questions or updates, please contact [facility] Please consult [email or web address] for contact info under       Signed, Manuelita Rummer, NP    ATTENDING ATTESTATION:  After conducting a review of all available clinical information with the care team, interviewing the patient, and performing a physical exam, I agree  with the findings and plan described in this note.   GEN: No acute distress, AO x 3 HEENT:  MMM, no JVD, no scleral icterus Cardiac: RRR, no murmurs, rubs, or gallops.  Respiratory: Clear to auscultation bilaterally. GI: Soft, nontender, non-distended  MS: No edema; No deformity. Neuro:  Nonfocal  Vasc: Right femoral site clean dry and intact  Patient doing well after uncomplicated PCI of LAD with 1 drug-eluting stent.  She has a known chronic total occlusion of the right coronary artery which be treated medically as she is not a surgical candidate.  Continue aspirin  and Plavix  for at least 6  months and then Plavix  monotherapy indefinitely.  Continue losartan  25 mg, increase atorvastatin  to 80 mg.  Will discharge today with close hospital follow-up and cardiac rehab referral.  Lurena Red, MD Pager [phone]

## 2023-09-30 NOTE — Consult Note (Signed)
 WOC Nurse Consult Note: WOC consult performed remotely utilizing imaging and record review.  Reason for Consult:Bilateral LE wounds  Wound type: full thickness wounds to BLE, chat review revelas a recent admission in May with cellulitis and wounds to BLE. Pressure Injury POA: NA Measurement: see nursing flow sheets Wound bed:  anterior R lower leg/ left lower leg/ l posterior calf scabbed wound. R posterior lower leg: red, moist Drainage (amount, consistency, odor) see nursing flow sheets Periwound: intact Dressing procedure/placement/frequency:  Cleanse wounds with NS, pat dry.  Apply Xeroform to wound bed and cover with silicone foam dressing.  Change daily.   WOC team will not follow patient at this time.  Please re-consult if new needs arise.  Thank you,  Doyal Polite, RN, MSN, [facility] WOC Team [phone] (Available Mon-Fri 0700-1500)

## 2023-09-30 NOTE — Progress Notes (Signed)
 CARDIAC REHAB PHASE I     Post stent education including site care, restrictions, risk factors, exercise guidelines, NTG use, antiplatelet therapy importance, heart healthy  diet and CRP2 reviewed. All questions and concerns addressed. Will refer to [place] for CRP2. Plan for home later today.     Vaughn Asberry Hacking, RN BSN 09/30/2023 11:17 AM

## 2023-09-30 NOTE — Discharge Summary (Addendum)
 Discharge Summary   Patient ID: Tracy Oconnor MRN: [id number]; DOB: 09/24/1960  Admit date: 09/29/2023 Discharge date: 09/30/2023  PCP:  Trinidad Hun, MD   [facility] Providers Cardiologist:  Jennifer [name], MD    Discharge Diagnoses  Principal Problem:   Unstable angina [facility]) Active Problems:   COPD (chronic obstructive pulmonary disease) (HCC)   Hyperlipidemia   Hypertension   Diastolic dysfunction  Diagnostic Studies/Procedures   Cath: 09/29/2023    Prox RCA lesion is 60% stenosed.   Mid RCA lesion is 100% stenosed.   Dist RCA lesion is 100% stenosed.   2nd Diag lesion is 50% stenosed.   2nd Mrg lesion is 50% stenosed.   Mid LAD lesion is 90% stenosed.   A drug-eluting stent was successfully placed using a STENT SYNERGY XD 2.75X12.   Post intervention, there is a 0% residual stenosis.   Severe mid LAD stenosis Successful PTCA/DES x 1 mid LAD Moderate stenosis first obtuse marginal branch Chronic total occlusion of the mid RCA. The distal vessel fills from left to right collaterals.    Recommendations: DAPT with ASA and Plavix  for at least six months. Tobacco cessation. Continue statin. D/c home tomorrow.   Diagnostic Dominance: Right  Intervention   _____________   History of Present Illness   Tracy Oconnor is a 63 y.o. female with past medical history noted above.  She has been followed by Dr. Crape as an outpatient.  She will establish care with cardiology 07/2022 for evaluation of dyspnea on exertion from her pulmonologist.  She had had a CT of her chest concerning for CAD.  Underwent Lexi scan which showed reversible ischemia and underwent left heart cath on 08/2022 which showed CTO of her RCA with left-to-right collaterals with recommendations for medical therapy.   Presented to [facility] on 8/26 with complaints of left-sided chest pain which initially was noted to be reproducible and improved with aspirin  and sublingual  nitroglycerin  as well as IV Toradol.  EKG sinus rhythm, 67 bpm with no significant ST/T wave abnormalities.  Troponin I negative x 2, proBNP 872.  Cardiology initially consulted with recommendations for CT angio which was negative for PE.  Developed recurrent chest pain which was nonreproducible and seen by Dr. Liborio with recommendations to start on IV heparin  and transfer to [facility] for further evaluation for cardiac catheterization given she reported symptoms were similar to prior angina.  Did undergo echocardiogram with normal BiV function with mild to moderate MR.  Hospital Course    Chest pain concerning for unstable angina CAD known CTO of distal RCA -- Presented to [facility] with left-sided chest pain which was initially resolved with aspirin , sublingual nitroglycerin  and Toradol and felt to be musculoskeletal in nature.  She had a recurrent episode which was similar to previous angina and seen by cardiology with recommendations to start on IV heparin  and transferred to [facility] for further evaluation with cardiac catheterization. -- Underwent cardiac catheterization 8/27 with known CTO of RCA left-to-right collaterals, new lesion of 90% in mid LAD treated with PCI/DES x 1.  Recommendations for aspirin /Plavix  for at least 6 months. -- Continue aspirin , plavix , losartan  25mg , Imdur  15mg  daily   HTN -- continue losartan  25mg  daily, Imdur  30mg  daily   HLD -- increased atorvastatin  80mg  daily -- FLP/LFTs in 8 weeks    COPD on O2 at bedtime OSA -- continue home meds   HFpEF -- Echo at [facility] with normal BiV function -- on torsemide  20mg  BID  Chronic pain -- follows in pain clinic outpatient  Patient was seen by myself and Dr. Wendel and deemed stable for discharge home. Follow up appt arranged in the office. Medications sent to the [facility] pharmacy.   Did the patient have an acute coronary syndrome (MI, NSTEMI, STEMI, etc) this admission?:  Yes                                AHA/ACC ACS Clinical Performance & Quality Measures: Aspirin  prescribed? - Yes ADP Receptor Inhibitor (Plavix /Clopidogrel , Brilinta/Ticagrelor or Effient/Prasugrel) prescribed (includes medically managed patients)? - Yes Beta Blocker prescribed? - No. The patient has an EF >/= 50%. Based upon the [facility] Study pub in Apr 2024, there is no benefit in patients with preserved EF post MI. High Intensity Statin (Lipitor 40-80mg  or Crestor 20-40mg ) prescribed? - Yes EF assessed during THIS hospitalization? - Yes For EF <40%, was ACEI/ARB prescribed? - Not Applicable (EF >/= 40%) For EF <40%, Aldosterone Antagonist (Spironolactone or Eplerenone) prescribed? - Not Applicable (EF >/= 40%) Cardiac Rehab Phase II ordered (including medically managed patients)? - Yes       The patient will be scheduled for a TOC follow up appointment in 10-14 days.  A message has been sent to the [facility] and Scheduling Pool at the office where the patient should be seen for follow up.  _____________  Discharge Vitals Blood pressure 98/70, pulse 92, temperature 98.6 F (37 C), temperature source Oral, resp. rate 16, weight 69.4 kg, SpO2 91%.  Filed Weights   09/29/23 2217  Weight: 69.4 kg    Labs & Radiologic Studies  CBC Recent Labs    09/30/23 0439  WBC 8.0  HGB 9.4*  HCT 29.3*  MCV 91.6  PLT 315   Basic Metabolic Panel Recent Labs    91/71/74 0439  NA 140  K 3.6  CL 106  CO2 26  GLUCOSE 83  BUN 18  CREATININE 1.14*  CALCIUM  8.4*   Liver Function Tests No results for input(s): AST, ALT, ALKPHOS, BILITOT, PROT, ALBUMIN in the last 72 hours. No results for input(s): LIPASE, AMYLASE in the last 72 hours. High Sensitivity Troponin:   No results for input(s): TROPONINIHS in the last 720 hours.  No results for input(s): TRNPT in the last 720 hours.  BNP Invalid input(s): POCBNP No results for input(s): PROBNP in the last 72 hours.  No results for input(s): BNP  in the last 72 hours.  D-Dimer No results for input(s): DDIMER in the last 72 hours. Hemoglobin A1C No results for input(s): HGBA1C in the last 72 hours. Fasting Lipid Panel No results for input(s): CHOL, HDL, LDLCALC, TRIG, CHOLHDL, LDLDIRECT in the last 72 hours. Lipoprotein (a)  Date/Time Value Ref Range Status  08/11/2022 10:39 AM 191.4 (H) <75.0 nmol/L Final    Comment:    Note:  Values greater than or equal to 75.0 nmol/L may        indicate an independent risk factor for CHD,        but must be evaluated with caution when applied        to non-Caucasian populations due to the        influence of genetic factors on Lp(a) across        ethnicities.     Thyroid  Function Tests No results for input(s): TSH, T4TOTAL, T3FREE, THYROIDAB in the last 72 hours.  Invalid input(s): FREET3 _____________  CARDIAC CATHETERIZATION  Result Date: 09/29/2023   Prox RCA lesion is 60% stenosed.   Mid RCA lesion is 100% stenosed.   Dist RCA lesion is 100% stenosed.   2nd Diag lesion is 50% stenosed.   2nd Mrg lesion is 50% stenosed.   Mid LAD lesion is 90% stenosed.   A drug-eluting stent was successfully placed using a STENT SYNERGY XD 2.75X12.   Post intervention, there is a 0% residual stenosis. Severe mid LAD stenosis Successful PTCA/DES x 1 mid LAD Moderate stenosis first obtuse marginal branch Chronic total occlusion of the mid RCA. The distal vessel fills from left to right collaterals. Recommendations: DAPT with ASA and Plavix  for at least six months. Tobacco cessation. Continue statin. D/c home tomorrow.   MM 3D SCREENING MAMMOGRAM BILATERAL BREAST Result Date: 09/17/2023 CLINICAL DATA:  Screening. EXAM: DIGITAL SCREENING BILATERAL MAMMOGRAM WITH TOMOSYNTHESIS AND CAD TECHNIQUE: Bilateral screening digital craniocaudal and mediolateral oblique mammograms were obtained. Bilateral screening digital breast tomosynthesis was performed. The images were evaluated with  computer-aided detection. COMPARISON:  Previous exam(s). ACR Breast Density Category b: There are scattered areas of fibroglandular density. FINDINGS: There are no findings suspicious for malignancy. IMPRESSION: No mammographic evidence of malignancy. A result letter of this screening mammogram will be mailed directly to the patient. RECOMMENDATION: Screening mammogram in one year. (Code:SM-B-01Y) BI-RADS CATEGORY  1: Negative. Electronically Signed   By: Reyes Phi M.D.   On: 09/17/2023 08:48    Disposition Pt is being discharged home today in good condition.  Follow-up Plans & Appointments  Follow-up Information     [facility] Follow up.   Specialty: Home Health Services Why: Registered Nurse, Occupational and Physical Therapy, Aide-office to call with visit times. Contact information: [address] [place] [address] [phone]         [facility]  Follow up.   Why: portable oxygen  delivered to the room Contact information: [address] [place] [address] [phone]                Discharge Instructions     Amb Referral to Cardiac Rehabilitation   Complete by: As directed    Diagnosis: Coronary Stents   After initial evaluation and assessments completed: Virtual Based Care may be provided alone or in conjunction with Phase 2 Cardiac Rehab based on patient barriers.: Yes   Intensive Cardiac Rehabilitation (ICR) [facility] location only OR Traditional Cardiac Rehabilitation (TCR) *If criteria for ICR are not met will enroll in TCR [facility] only): Yes   Diet - low sodium heart healthy   Complete by: As directed    Discharge instructions   Complete by: As directed    Groin Site Care Refer to this sheet in the next few weeks. These instructions provide you with information on caring for yourself after your procedure. Your caregiver may also give you more specific instructions. Your treatment has been planned according to current medical  practices, but problems sometimes occur. Call your caregiver if you have any problems or questions after your procedure. HOME CARE INSTRUCTIONS You may shower 24 hours after the procedure. Remove the bandage (dressing) and gently wash the site with plain soap and water . Gently pat the site dry.  Do not apply powder or lotion to the site.  Do not sit in a bathtub, swimming pool, or whirlpool for 5 to 7 days.  No bending, squatting, or lifting anything over 10 pounds (4.5 kg) as directed by your caregiver.  Inspect the site at  least twice daily.  Do not drive home if you are discharged the same day of the procedure. Have someone else drive you.  You may drive 24 hours after the procedure unless otherwise instructed by your caregiver.  What to expect: Any bruising will usually fade within 1 to 2 weeks.  Blood that collects in the tissue (hematoma) may be painful to the touch. It should usually decrease in size and tenderness within 1 to 2 weeks.  SEEK IMMEDIATE MEDICAL CARE IF: You have unusual pain at the groin site or down the affected leg.  You have redness, warmth, swelling, or pain at the groin site.  You have drainage (other than a small amount of blood on the dressing).  You have chills.  You have a fever or persistent symptoms for more than 72 hours.  You have a fever and your symptoms suddenly get worse.  Your leg becomes pale, cool, tingly, or numb.  You have heavy bleeding from the site. Hold pressure on the site. Tracy Oconnor  PLEASE DO NOT MISS ANY DOSES OF YOUR PLAVIX !!!!! Also keep a log of you blood pressures and bring back to your follow up appt. Please call the office with any questions.   Patients taking blood thinners should generally stay away from medicines like ibuprofen, Advil, Motrin, naproxen, and Aleve due to risk of stomach bleeding. You may take Tylenol  as directed or talk to your primary doctor about alternatives.   PLEASE ENSURE THAT YOU DO NOT RUN OUT OF YOUR PLAVIX .  This medication is very important to remain on for at least one year. IF you have issues obtaining this medication due to cost please CALL the office 3-5 business days prior to running out in order to prevent missing doses of this medication.   Discharge wound care:   Complete by: As directed    Cleanse wounds with normal saline, pat dry.  Apply Xeroform to wound bed and cover with silicone foam dressing.  Change daily.   Face-to-face encounter (required for Medicare/Medicaid patients)   Complete by: As directed    I Manuelita Rummer certify that this patient is under my care and that I, or a nurse practitioner or physician's assistant working with me, had a face-to-face encounter that meets the physician face-to-face encounter requirements with this patient on 09/30/2023. The encounter with the patient was in whole, or in part for the following medical condition(s) which is the primary reason for home health care (List medical condition): Deconditioned, CAD, COPD on O2   The encounter with the patient was in whole, or in part, for the following medical condition, which is the primary reason for home health care: Deconditioned, CAD, COPD on O2   I certify that, based on my findings, the following services are medically necessary home health services:  Nursing Physical therapy     Reason for Medically Necessary Home Health Services: Other See Comments   My clinical findings support the need for the above services: OTHER SEE COMMENTS   Further, I certify that my clinical findings support that this patient is homebound due to: Ambulates short distances less than 300 feet   Home Health   Complete by: As directed    To provide the following care/treatments:  PT OT RN Home Health Aide     RN for dressing changes  Dressing instructions- Cleanse wounds with normal saline, pat dry.  Apply Xeroform to wound bed and cover with silicone foam dressing.  Change daily.   Increase activity slowly  Complete by:  As directed        Discharge Medications Allergies as of 09/30/2023       Reactions   Penicillins    Childhood allergy not sure what happens    Jardiance  [empagliflozin ] Other (See Comments)   Yeast infection        Medication List     STOP taking these medications    diclofenac 75 MG EC tablet Commonly known as: VOLTAREN       TAKE these medications    albuterol  108 (90 Base) MCG/ACT inhaler Commonly known as: VENTOLIN  HFA Inhale 2 puffs into the lungs every 4 (four) hours as needed for shortness of breath or wheezing.   ARIPiprazole  15 MG tablet Commonly known as: ABILIFY  Take 15 mg by mouth daily.   aspirin  EC 81 MG tablet Take 1 tablet (81 mg total) by mouth daily. Swallow whole.   atorvastatin  80 MG tablet Commonly known as: LIPITOR Take 1 tablet (80 mg total) by mouth at bedtime. What changed:  medication strength how much to take   citalopram  40 MG tablet Commonly known as: CELEXA  Take 40 mg by mouth daily.   clopidogrel  75 MG tablet Commonly known as: PLAVIX  Take 1 tablet (75 mg total) by mouth daily with breakfast.   clotrimazole 1 % cream Commonly known as: LOTRIMIN Apply 1 Application topically daily as needed.   fluticasone 50 MCG/ACT nasal spray Commonly known as: FLONASE Place 2 sprays into both nostrils 2 (two) times daily.   gabapentin  400 MG capsule Commonly known as: NEURONTIN  Take 400 mg by mouth 3 (three) times daily.   HYDROcodone -acetaminophen  10-325 MG tablet Commonly known as: NORCO Take 1 tablet by mouth 3 (three) times daily as needed for moderate pain (pain score 4-6) or severe pain (pain score 7-10).   ipratropium-albuterol  0.5-2.5 (3) MG/3ML Soln Commonly known as: DUONEB SMARTSIG:1 Vial(s) Every 6 Hours   isosorbide  mononitrate 30 MG 24 hr tablet Commonly known as: IMDUR  Take 0.5 tablets (15 mg total) by mouth daily.   lidocaine  5 % Commonly known as: LIDODERM  Place 1 patch onto the skin daily as needed  (for pain).   losartan  25 MG tablet Commonly known as: COZAAR  Take 25 mg by mouth daily.   methocarbamol  500 MG tablet Commonly known as: ROBAXIN  Take 500 mg by mouth 2 (two) times daily.   metroNIDAZOLE  500 MG tablet Commonly known as: FLAGYL  Take 1 tablet (500 mg total) by mouth 2 (two) times daily.   MULTIVITAMIN ADULT PO Take 1 tablet by mouth daily.   naloxone 4 MG/0.1ML Liqd nasal spray kit Commonly known as: NARCAN Place 1 spray into the nose daily as needed (overdose).   nitroGLYCERIN  0.4 MG SL tablet Commonly known as: NITROSTAT  Place 1 tablet (0.4 mg total) under the tongue every 5 (five) minutes as needed for chest pain.   ondansetron  4 MG disintegrating tablet Commonly known as: ZOFRAN -ODT Take 4 mg by mouth every 6 (six) hours as needed for nausea or vomiting.   Oyster Shell Calcium  250+D 250-3.125 MG-MCG Tabs Generic drug: Calcium  Carb-Cholecalciferol Take 1 tablet by mouth daily.   Premarin vaginal cream Generic drug: conjugated estrogens Place 0.5 g vaginally 2 (two) times a week. Take on Sundays and Thursdays per patient   QUEtiapine  300 MG tablet Commonly known as: SEROQUEL  Take 300 mg by mouth at bedtime.   torsemide  20 MG tablet Commonly known as: DEMADEX  Take 1 tablet (20 mg total) by mouth 2 (two) times daily.   traZODone   50 MG tablet Commonly known as: DESYREL  Take 100 mg by mouth at bedtime.   Trelegy Ellipta 200-62.5-25 MCG/ACT Aepb Generic drug: Fluticasone-Umeclidin-Vilant Inhale 1 puff into the lungs daily.   Vitamin D-3 125 MCG (5000 UT) Tabs Take 5,000 Units by mouth daily.               Discharge Care Instructions  (From admission, onward)           Start     Ordered   09/30/23 0000  Discharge wound care:       Comments: Cleanse wounds with normal saline, pat dry.  Apply Xeroform to wound bed and cover with silicone foam dressing.  Change daily.   09/30/23 1133             Outstanding  Labs/Studies  FLP/LFTs in 8 weeks  Duration of Discharge Encounter: APP Time: 25 minutes   Signed, Manuelita Rummer, NP 09/30/2023, 11:36 AM

## 2023-09-30 NOTE — TOC Initial Note (Addendum)
 Transition of Care [facility]) - Initial/Assessment Note    Patient Details  Name: Tracy Oconnor MRN: [id number] Date of Birth: 14-Nov-1960  Transition of Care [facility]) CM/SW Contact:    Sudie Erminio Deems, RN Phone Number: 09/30/2023, 10:30 AM  Clinical Narrative: Patient presented as a transfer from [facility] for chest pain. Patient states her rolling walker was [facility] at [facility]; ambulance unable to bring it to the hospital. Patient states she is from home alone and has support of daughter. Patient had to call the daughter via FB App. Daughter will go by [facility] to pick up the rolling walker. Case Manager was able to speak with daughter and she can be reached at [phone]. Patient is currently active with [facility] for RN/PT/OT adding Aide to services. Daughter states she will get additional care in the home to assist mom with dressing changes since she works. Daughter and patient unaware of DME Agency. Case Manager has called [employer] and the patient is active-for Oxygen . DME will be delivered to the home. Patient will transition to the discharge lounge and a cab will transport the patient home. No further needs identified at this time.                  Daughter will be at the house by 1500 and patient can be sent home in cab per family. No further needs identified at this time.   Expected Discharge Plan: Home w Home Health Services Barriers to Discharge: No Barriers Identified   Patient Goals and CMS Choice Patient states their goals for this hospitalization and ongoing recovery are:: plan to return home   Choice offered to / list presented to : NA (Currently active with [facility])      Expected Discharge Plan and Services In-house Referral: NA Discharge Planning Services: CM Consult Post Acute Care Choice: Home Health, Resumption of Svcs/PTA Provider Living arrangements for the past 2 months: Single Family Home                            HH Arranged: RN, Disease Management, PT, OT, Nurse's Aide HH Agency: [facility] Date [facility] Agency Contacted: 09/30/23 Time HH Agency Contacted: 1029 Representative spoke with at [facility] Agency: [place]  Prior Living Arrangements/Services Living arrangements for the past 2 months: Single Family Home Lives with:: Self (has support of daughter) Patient language and need for interpreter reviewed:: Yes        Need for Family Participation in Patient Care: Yes (Comment) Care giver support system in place?: No (comment)   Criminal Activity/Legal Involvement Pertinent to Current Situation/Hospitalization: No - Comment as needed  Activities of Daily Living      Permission Sought/Granted Permission sought to share information with : Case Manager, Magazine features editor, Family Supports Permission granted to share information with : Yes, Verbal Permission Granted     Permission granted to share info w AGENCY: [facility]        Emotional Assessment Appearance:: Appears stated age Attitude/Demeanor/Rapport: Engaged Affect (typically observed): Appropriate Orientation: : Oriented to Self, Oriented to Place, Oriented to  Time Alcohol  / Substance Use: Not Applicable Psych Involvement: No (comment)  Admission diagnosis:  Chest pain [R07.9] Unstable angina (HCC) [I20.0] Patient Active Problem List   Diagnosis Date Noted   Unstable angina (HCC) 09/29/2023   Chronic pain syndrome 07/06/2023   Long term (current) use of opiate analgesic 07/06/2023   Spondylosis 07/06/2023  Hyponatremia 06/07/2023   Multiple open wounds of lower extremity 06/07/2023   Diastolic dysfunction 06/07/2023   Elevated lipoprotein A level    Mitral regurgitation    DOE (dyspnea on exertion) 07/29/2022   Coronary artery disease 07/29/2022   Aortic atherosclerosis (HCC) 07/29/2022   Obesity (BMI 30.0-34.9) 07/29/2022   Atherosclerosis 06/23/2022   COPD (chronic  obstructive pulmonary disease) (HCC) 06/23/2022   Edema 06/23/2022   Fatigue 06/23/2022   Intermittent chest pain 06/23/2022   Murmur 06/23/2022   OSA (obstructive sleep apnea) 06/23/2022   Palpitations 06/23/2022   Bipolar disorder (HCC) 06/23/2022   Hyperlipidemia 06/23/2022   Hypertension 06/23/2022   Bilateral carpal tunnel syndrome 01/30/2021   Cervical radiculopathy 01/30/2021   Enteritis 12/17/2020   PCP:  Trinidad Hun, MD Pharmacy:   [facility] [phone] [place], [place] - [address] AT [facility] [address] [place] [address] Phone: [phone] Fax: [phone]  [facility] - [place], [place] - [address] [place] [address] Phone: [phone] Fax: [phone]  [facility] #[id number] - [place], [place] - [address] AT [facility] OF [address] & [address] [place] [address] Phone: [phone] Fax: [phone]  [facility] [address] [place] [address] Phone: [phone] Fax: [phone]     Social Drivers of Health (SDOH) Social History: SDOH Screenings   Food Insecurity: No Food Insecurity (09/30/2023)  Recent Concern: Food Insecurity - Food Insecurity Present (09/07/2023)  Housing: Unknown (09/30/2023)  Transportation Needs: Unmet Transportation Needs (09/30/2023)  Utilities: Not At Risk (09/30/2023)  Depression (PHQ2-9): Low Risk  (08/19/2023)  Social Connections: Moderately Isolated (06/08/2023)  Tobacco Use: Medium Risk (09/16/2023)   SDOH Interventions:     Readmission Risk Interventions     No data to display

## 2023-09-30 NOTE — Plan of Care (Signed)

## 2023-10-01 ENCOUNTER — Other Ambulatory Visit (HOSPITAL_BASED_OUTPATIENT_CLINIC_OR_DEPARTMENT_OTHER): Payer: Self-pay | Admitting: Physician Assistant

## 2023-10-01 ENCOUNTER — Telehealth (HOSPITAL_COMMUNITY): Payer: Self-pay | Admitting: *Deleted

## 2023-10-01 DIAGNOSIS — E2839 Other primary ovarian failure: Secondary | ICD-10-CM

## 2023-10-01 NOTE — Telephone Encounter (Signed)
 Outpatient Cardiac rehab phase II referral sent to [facility] per pt preference of location. Tracy Oconnor, BSN Cardiac and Emergency planning/management officer

## 2023-10-01 NOTE — Telephone Encounter (Signed)
 Patient contacted regarding discharge from [facility] on 09/30/23.  Patient understands to follow up with provider Delon Hoover, NP on 10/08/23 at 10:05 at [facility]. Patient understands discharge instructions? yes Patient understands medications and regiment? yes Patient understands to bring all medications to this visit? yes  Ask patient:  Are you enrolled in My Chart YES

## 2023-10-04 DIAGNOSIS — J449 Chronic obstructive pulmonary disease, unspecified: Secondary | ICD-10-CM | POA: Diagnosis not present

## 2023-10-04 DIAGNOSIS — F319 Bipolar disorder, unspecified: Secondary | ICD-10-CM | POA: Diagnosis not present

## 2023-10-05 DIAGNOSIS — F319 Bipolar disorder, unspecified: Secondary | ICD-10-CM | POA: Diagnosis not present

## 2023-10-05 DIAGNOSIS — L97213 Non-pressure chronic ulcer of right calf with necrosis of muscle: Secondary | ICD-10-CM | POA: Diagnosis not present

## 2023-10-05 DIAGNOSIS — I700 Atherosclerosis of aorta: Secondary | ICD-10-CM | POA: Diagnosis not present

## 2023-10-05 DIAGNOSIS — I959 Hypotension, unspecified: Secondary | ICD-10-CM | POA: Diagnosis not present

## 2023-10-05 DIAGNOSIS — L97812 Non-pressure chronic ulcer of other part of right lower leg with fat layer exposed: Secondary | ICD-10-CM | POA: Diagnosis not present

## 2023-10-05 DIAGNOSIS — I5032 Chronic diastolic (congestive) heart failure: Secondary | ICD-10-CM | POA: Diagnosis not present

## 2023-10-05 DIAGNOSIS — I872 Venous insufficiency (chronic) (peripheral): Secondary | ICD-10-CM | POA: Diagnosis not present

## 2023-10-05 DIAGNOSIS — I110 Hypertensive heart disease with heart failure: Secondary | ICD-10-CM | POA: Diagnosis not present

## 2023-10-05 DIAGNOSIS — N179 Acute kidney failure, unspecified: Secondary | ICD-10-CM | POA: Diagnosis not present

## 2023-10-05 NOTE — Progress Notes (Unsigned)
 Cardiology Office Note:  .   Date:  10/08/2023  ID:  [name], DOB [birth year], MRN [id number] PCP: Trinidad Hun, MD  [facility] Providers Cardiologist:  Alyssabeth Bruster [name], MD    History of Present Illness: .   Tracy Oconnor is a 63 y.o. female with a past medical history of hypertension, CAD no intervention with CTO of distal RCA, HFpEF, COPD with O2 at bedtime, OSA, palpitations, bipolar disorder, hyperlipidemia, former smoker cessation 2022.  09/29/23 left heart cath severe mid LAD stenosis s/p PTCA and DES x 1, CTO of the mid RCA distal vessels filled from left-to-right collaterals 08/26/2022 echo EF 55 to 60%, no RWMA, mild concentric LVH, grade 1 DD, moderate MR, mild AR 08/26/2022 abdominal ultrasound 2.2 cm dilation of the proximal abdominal aorta 08/13/2022 left heart cath severe single-vessel CAD with CTO of distal RCA with left-right collaterals, moderate LCA disease >> isosorbide  started 07/30/2022 Lexiscan  findings consistent with ischemia, intermediate risk  [facility] with [facility] in June 2024 for evaluation of CAD and DOE at the behest of her pulmonologist, she apparently had a CT of her chest revealing CAD.  Lexiscan  was arranged revealing reversible ischemia >> left heart cath on 08/13/2022 revealed CTO of her RCA with left-to-right collaterals and recommendations for medical therapy. Admitted [facility] on 01/26/2023 to 01/29/2023, diagnosed with COVID-19, COPD exacerbation.  She was ultimately discharged home with home health services.  Evaluated 02/18/2023 for follow-up after hospitalization as outlined above, she was feeling better but still bothered by pedal edema.  We doubled her Demadex  started her on Jardiance  and advised to follow-up in 6 weeks.  Most recently evaluated by myself on 04/05/2023, she was much better from a cardiac perspective, weight was down 4 pounds, she did report a mycotic infection however this was the first of its nature and we  discussed keeping track of this now that she was on an SGLT2 inhibitor, plans to follow-up in 3 months.  She was evaluated in [facility] on 06/07/2023 for hyponatremia, also noted to have cellulitis, sodium was 123 on day of discharge it was 132.  She presented to [facility] on 09/28/2023 with complaints of left-sided chest pain, her EKG was normal, troponin was negative, proBNP was 872, CT of chest was negative for PE, ultimately transferred to [facility] to undergo cardiac catheterization which was completed on 09/29/2023 revealing severe mid LAD stenosis, PTCA/DES x 1 to the mid LAD, CTO of the mid RCA filled by collateral circulation.  She presents today for follow-up after her recent left heart cath and intervention as outlined above.  She is doing much better has not had any further episodes of chest pain.  Her breathing is at baseline for her, she uses oxygen  at night.  She does have home health services coming out for wounds on her legs that have been present for some time.  Her blood pressure is on the low normal, she also also been dizzy at times. She denies chest pain, palpitations, dyspnea, pnd, orthopnea, n, v, dizziness, syncope, edema, weight gain, or early satiety.    ROS: Review of Systems  Constitutional:  Positive for malaise/fatigue.  HENT: Negative.    Eyes: Negative.   Respiratory:  Positive for shortness of breath (baseline).   Cardiovascular:  Positive for leg swelling.  Gastrointestinal: Negative.   Genitourinary: Negative.   Musculoskeletal: Negative.   Skin: Negative.   Neurological: Negative.   Endo/Heme/Allergies:  Bruises/bleeds easily.  Psychiatric/Behavioral: Negative.  Studies Reviewed: .        Cardiac Studies & Procedures   ______________________________________________________________________________________________ CARDIAC CATHETERIZATION  CARDIAC CATHETERIZATION 09/29/2023  Conclusion   Prox RCA lesion is 60% stenosed.   Mid RCA  lesion is 100% stenosed.   Dist RCA lesion is 100% stenosed.   2nd Diag lesion is 50% stenosed.   2nd Mrg lesion is 50% stenosed.   Mid LAD lesion is 90% stenosed.   A drug-eluting stent was successfully placed using a STENT SYNERGY XD 2.75X12.   Post intervention, there is a 0% residual stenosis.  Severe mid LAD stenosis Successful PTCA/DES x 1 mid LAD Moderate stenosis first obtuse marginal branch Chronic total occlusion of the mid RCA. The distal vessel fills from left to right collaterals.  Recommendations: DAPT with ASA and Plavix  for at least six months. Tobacco cessation. Continue statin. D/c home tomorrow.  Findings Coronary Findings Diagnostic  Dominance: Right  Left Main Vessel is large. Vessel is angiographically normal.  Left Anterior Descending Mid LAD lesion is 90% stenosed.  First Diagonal Branch Vessel is moderate in size.  Second Diagonal Branch Vessel is moderate in size. 2nd Diag lesion is 50% stenosed.  Left Circumflex Vessel is moderate in size.  First Obtuse Marginal Branch Vessel is moderate in size.  Second Obtuse Marginal Branch Vessel is moderate in size. 2nd Mrg lesion is 50% stenosed.  Third Obtuse Marginal Branch Vessel is small in size.  Right Coronary Artery Vessel is moderate in size. Prox RCA lesion is 60% stenosed. Mid RCA lesion is 100% stenosed. The lesion is chronically occluded with bridging collateral flow. Dist RCA lesion is 100% stenosed. The lesion is chronically occluded with left-to-right collateral flow.  Right Posterior Descending Artery Collaterals RPDA filled by collaterals from Dist LAD.  First Right Posterolateral Branch Collaterals 1st RPL filled by collaterals from 2nd Sept.  Intervention  Mid LAD lesion Stent CATH VISTA GUIDE 6FR XBLD 3.5 guide catheter was inserted. Lesion crossed with guidewire using a WIRE ASAHI PROWATER 180CM. Pre-stent angioplasty was performed using a BALLOON EMERGE MR 2.0X12. A  drug-eluting stent was successfully placed using a STENT SYNERGY XD 2.75X12. Post-stent angioplasty was performed using a BALLOON SAPPHIRE NC24 3.0X8. Post-Intervention Lesion Assessment The intervention was successful. Pre-interventional TIMI flow is 3. Post-intervention TIMI flow is 3. No complications occurred at this lesion. There is a 0% residual stenosis post intervention.   CARDIAC CATHETERIZATION  CARDIAC CATHETERIZATION 08/13/2022  Conclusion Conclusions: Severe single-vessel coronary artery disease with chronic total occlusion of distal RCA with robust left-to-right collaterals. Moderate left coronary artery disease with 60% mid LAD stenosis (RFR borderline at 0.90) and 50% D2 and OM2 stenoses. Normal left ventricular systolic function (LVEF 55-65%) with mildly elevated filling pressure (LVEDP 18 mmHg).  Recommendations: Add isosorbide  mononitrate for antianginal therapy. Aggressive secondary prevention of coronary artery disease.  Lonni Hanson, MD [facility]  Findings Coronary Findings Diagnostic  Dominance: Right  Left Main Vessel is large. Vessel is angiographically normal.  Left Anterior Descending Mid LAD lesion is 60% stenosed. Pressure gradient was performed on the lesion using a GUIDEWIRE PRESSURE X 175 and CATH LAUNCHER 28F EBU3.5. RFR: 0.9.  First Diagonal Branch Vessel is moderate in size.  Second Diagonal Branch Vessel is moderate in size. 2nd Diag lesion is 50% stenosed.  Left Circumflex Vessel is moderate in size.  First Obtuse Marginal Branch Vessel is moderate in size.  Second Obtuse Marginal Branch Vessel is moderate in size. 2nd Mrg lesion is 50% stenosed.  Third  Obtuse Marginal Branch Vessel is small in size.  Right Coronary Artery Vessel is moderate in size. Mid RCA lesion is 60% stenosed. Dist RCA-1 lesion is 100% stenosed. The lesion is chronically occluded with bridging collateral flow. Dist RCA-2 lesion is 100% stenosed.  The lesion is chronically occluded with left-to-right collateral flow.  Right Posterior Descending Artery Collaterals RPDA filled by collaterals from Dist LAD.  First Right Posterolateral Branch Collaterals 1st RPL filled by collaterals from 2nd Sept.  Intervention  No interventions have been documented.   STRESS TESTS  MYOCARDIAL PERFUSION IMAGING 07/30/2022  Interpretation Summary   Findings are consistent with ischemia involving basal, mid and apical portion of the inferior wall. The study is intermediate risk secondary to mildly diminished EF.   No ST deviation was noted.   Left ventricular function is abnormal. Global function is mildly reduced. Nuclear stress EF: 49 %. The left ventricular ejection fraction is mildly decreased (45-54%). End diastolic cavity size is normal.   Prior study not available for comparison.   ECHOCARDIOGRAM  ECHOCARDIOGRAM COMPLETE 08/26/2022  Narrative ECHOCARDIOGRAM REPORT    Patient Name:   RUDINE RIEGER Date of Exam: 08/26/2022 Medical Rec #:  [id number]        Height:       60.0 in Accession #:    [id number]       Weight:       166.0 lb Date of Birth:  02/08/1960        BSA:          1.724 m Patient Age:    62 years         BP:           140/5988 mmHg Patient Gender: F                HR:           62 bpm. Exam Location:  [facility]  Procedure: 2D Echo, Cardiac Doppler, Color Doppler and Strain Analysis  Indications:    DOE (dyspnea on exertion) [R06.09 (ICD-10-CM)]; Coronary artery disease involving native coronary artery of native heart without angina pectoris [I25.10 (ICD-10-CM)]  History:        Patient has no prior history of Echocardiogram examinations. Risk Factors:Hypertension and Dyslipidemia.  Sonographer:    Lynwood Silvas RDCS Referring Phys: CYRUS Serayah Yazdani SAUNDERS [facility]  IMPRESSIONS   1. Left ventricular ejection fraction, by estimation, is 55 to 60%. The left ventricle has normal function. The left ventricle has no regional  wall motion abnormalities. There is mild concentric left ventricular hypertrophy. Left ventricular diastolic parameters are consistent with Grade I diastolic dysfunction (impaired relaxation). The average left ventricular global longitudinal strain is -13.8 %. The global longitudinal strain is abnormal. 2. Right ventricular systolic function is mildly reduced. The right ventricular size is normal. Mildly increased right ventricular wall thickness. Tricuspid regurgitation signal is inadequate for assessing PA pressure. 3. The mitral valve is degenerative. Moderate mitral valve regurgitation. No evidence of mitral stenosis. 4. The aortic valve is tricuspid. Aortic valve regurgitation is mild. No aortic stenosis is present. 5. Aortic Normal DTA.  FINDINGS Left Ventricle: Left ventricular ejection fraction, by estimation, is 55 to 60%. The left ventricle has normal function. The left ventricle has no regional wall motion abnormalities. The average left ventricular global longitudinal strain is -13.8 %. The global longitudinal strain is abnormal. The left ventricular internal cavity size was normal in size. There is mild concentric left ventricular hypertrophy. Left ventricular diastolic parameters are consistent  with Grade I diastolic dysfunction (impaired relaxation). Indeterminate filling pressures.  Right Ventricle: The right ventricular size is normal. Mildly increased right ventricular wall thickness. Right ventricular systolic function is mildly reduced. Tricuspid regurgitation signal is inadequate for assessing PA pressure.  Left Atrium: Left atrial size was normal in size.  Right Atrium: Right atrial size was normal in size.  Pericardium: There is no evidence of pericardial effusion. Presence of epicardial fat layer.  Mitral Valve: The mitral valve is degenerative in appearance. Mild mitral annular calcification. Moderate mitral valve regurgitation. No evidence of mitral valve  stenosis.  Tricuspid Valve: The tricuspid valve is normal in structure. Tricuspid valve regurgitation is trivial. No evidence of tricuspid stenosis.  Aortic Valve: The aortic valve is tricuspid. Aortic valve regurgitation is mild. No aortic stenosis is present.  Pulmonic Valve: The pulmonic valve was normal in structure. Pulmonic valve regurgitation is trivial. No evidence of pulmonic stenosis.  Aorta: The aortic root and ascending aorta are structurally normal, with no evidence of dilitation, the aortic arch was not well visualized and Normal DTA.  Venous: The pulmonary veins were not well visualized. The inferior vena cava was not well visualized.  IAS/Shunts: No atrial level shunt detected by color flow Doppler.   LEFT VENTRICLE PLAX 2D LVIDd:         4.40 cm   Diastology LVIDs:         3.10 cm   LV e' medial:    4.03 cm/s LV PW:         1.20 cm   LV E/e' medial:  16.1 LV IVS:        1.10 cm   LV e' lateral:   6.53 cm/s LVOT diam:     2.00 cm   LV E/e' lateral: 9.9 LV SV:         65 LV SV Index:   38        2D Longitudinal Strain LVOT Area:     3.14 cm  2D Strain GLS Avg:     -13.8 %   RIGHT VENTRICLE            IVC RV Basal diam:  2.70 cm    IVC diam: 1.30 cm RV S prime:     9.68 cm/s TAPSE (M-mode): 1.6 cm  LEFT ATRIUM             Index        RIGHT ATRIUM          Index LA diam:        3.60 cm 2.09 cm/m   RA Area:     9.79 cm LA Vol (A2C):   25.5 ml 14.79 ml/m  RA Volume:   19.40 ml 11.25 ml/m LA Vol (A4C):   23.1 ml 13.40 ml/m LA Biplane Vol: 24.3 ml 14.09 ml/m AORTIC VALVE LVOT Vmax:   103.25 cm/s LVOT Vmean:  63.150 cm/s LVOT VTI:    0.206 m  AORTA Ao Root diam: 2.80 cm Ao Asc diam:  3.50 cm Ao Desc diam: 2.40 cm  MR Peak grad:    81.0 mmHg    TRICUSPID VALVE MR Mean grad:    49.0 mmHg    TR Peak grad:   8.1 mmHg MR Vmax:         450.00 cm/s  TR Vmax:        142.00 cm/s MR Vmean:        322.0 cm/s MR PISA:  4.54 cm     SHUNTS MR PISA Eff  ROA: 39 mm       Systemic VTI:  0.21 m MR PISA Radius:  0.85 cm      Systemic Diam: 2.00 cm MV E velocity: 64.75 cm/s MV A velocity: 83.85 cm/s MV E/A ratio:  0.77  Redell Leiter MD Electronically signed by Redell Leiter MD Signature Date/Time: 08/26/2022/12:37:05 PM    Final          ______________________________________________________________________________________________      Risk Assessment/Calculations:             Physical Exam:   VS:  BP 90/60   Pulse 88   Ht 5' (1.524 m)   Wt 146 lb (66.2 kg)   SpO2 91%   BMI 28.51 kg/m    Wt Readings from Last 3 Encounters:  10/08/23 146 lb (66.2 kg)  10/07/23 153 lb (69.4 kg)  09/29/23 153 lb (69.4 kg)    GEN: No acute distress NECK: No JVD; No carotid bruits CARDIAC: RRR, no murmurs, rubs, gallops RESPIRATORY:  diminished in bases, expiratory wheezing ABDOMEN: Soft, non-tender, non-distended EXTREMITIES:  no pedal edema SKIN: right groin cath site healing well with minimal ecchymosis  ASSESSMENT AND PLAN: .   Coronary artery disease -09/29/2023 left heart cath with PTCA and DES to mid LAD, known CTO of RCA. Stable with no anginal symptoms. No indication for ischemic evaluation.  Continue aspirin  81 mg daily, Plavix  75 mg daily, Lipitor 80 mg daily, Imdur  15 mg daily, nitroglycerin  as needed.  Hypertensive heart disease with heart failure - NYHA class II, euvolemic.SABRA Continue continue losartan  25 mg daily, continue Demadex  currently taking 40 mg in the morning and 20 mg in the afternoon.  Cannot tolerate SGLT2 inhibitor secondary to mycotic infections.  Hyperlipidemia/elevated LPa-most recent was 59, LP(a) 191--her Lipitor was increased to 80 mg daily Hypertension-blood pressure is controlled at 100/60. Continue losartan  25 mg daily.   COPD -former smoker complete cessation x 2 years, followed by Dr. Mardee, only requiring oxygen  at bedtime now which is an improvement for her.  Moderate mitral regurgitation-noted on  most recent echo, no indication for repeat imaging at this time.     Dispo: CBC, BMET today, follow-up in 8 weeks at that visit she will require repeat LFTs, fasting lipid panel.  Signed, Delon JAYSON Hoover, NP

## 2023-10-06 DIAGNOSIS — M479 Spondylosis, unspecified: Secondary | ICD-10-CM | POA: Diagnosis not present

## 2023-10-06 DIAGNOSIS — J9611 Chronic respiratory failure with hypoxia: Secondary | ICD-10-CM | POA: Diagnosis not present

## 2023-10-06 DIAGNOSIS — J449 Chronic obstructive pulmonary disease, unspecified: Secondary | ICD-10-CM | POA: Diagnosis not present

## 2023-10-06 DIAGNOSIS — Z79891 Long term (current) use of opiate analgesic: Secondary | ICD-10-CM | POA: Diagnosis not present

## 2023-10-06 DIAGNOSIS — R269 Unspecified abnormalities of gait and mobility: Secondary | ICD-10-CM | POA: Diagnosis not present

## 2023-10-06 DIAGNOSIS — G4736 Sleep related hypoventilation in conditions classified elsewhere: Secondary | ICD-10-CM | POA: Diagnosis not present

## 2023-10-06 DIAGNOSIS — M47816 Spondylosis without myelopathy or radiculopathy, lumbar region: Secondary | ICD-10-CM | POA: Diagnosis not present

## 2023-10-06 DIAGNOSIS — G4733 Obstructive sleep apnea (adult) (pediatric): Secondary | ICD-10-CM | POA: Diagnosis not present

## 2023-10-07 ENCOUNTER — Other Ambulatory Visit: Payer: Self-pay

## 2023-10-07 NOTE — Patient Outreach (Signed)
 Complex Care Management   Visit Note  10/07/2023  Name:  Tracy Oconnor MRN: [id number] DOB: [birth year]  Situation: Referral received for Complex Care Management related to SDOH Barriers:  Transportation,Food insecurity I obtained verbal consent from Patient. Patient is also seeking assistance with securing a scooter.  Visit completed with patient  on the phone. LCSW met with patient for scheduled visit. Patient explained that she will need to follow up with PCP in regards for needed paperwork with scooter. Patient states that she will follow up today. LCSW inquired about patient most recent hospitalization. Patient reports getting a stent and feels better. Background:   Past Medical History:  Diagnosis Date   Aortic atherosclerosis (HCC) 07/29/2022   Atherosclerosis    Bilateral carpal tunnel syndrome 01/30/2021   Bipolar disorder (HCC)    Cervical radiculopathy 01/30/2021   Chronic pain syndrome 07/06/2023   COPD (chronic obstructive pulmonary disease) (HCC)    Coronary artery disease 07/29/2022   Diastolic dysfunction 06/07/2023   DOE (dyspnea on exertion) 07/29/2022   Edema    Elevated lipoprotein A level    2024   Enteritis 12/17/2020   Fatigue    Hyperlipidemia    Hypertension    Hyponatremia 06/07/2023   Intermittent chest pain    Long term (current) use of opiate analgesic 07/06/2023   Mitral regurgitation    Multiple open wounds of lower extremity 06/07/2023   Murmur    Obesity (BMI 30.0-34.9) 07/29/2022   OSA (obstructive sleep apnea)    Palpitations    Spondylosis 07/06/2023    Assessment: Patient Reported Symptoms:  Cognitive Cognitive Status: Normal speech and language skills, Alert and oriented to person, place, and time Cognitive/Intellectual Conditions Management [RPT]: None reported or documented in medical history or problem list      Neurological Neurological Review of Symptoms: No symptoms reported    HEENT HEENT Symptoms Reported: No symptoms  reported      Cardiovascular Cardiovascular Symptoms Reported: Chest pain or discomfort Does patient have uncontrolled Hypertension?: No Weight: 153 lb (69.4 kg) Cardiovascular Self-Management Outcome: 4 (good) Cardiovascular Comment: Patient reports having chest pains which resulted in her getting a stent.  Respiratory Respiratory Symptoms Reported: Dry cough Other Respiratory Symptoms: Patient reports having COPD Respiratory Management Strategies: Oxygen  therapy, CPAP, Adequate rest  Endocrine Endocrine Symptoms Reported: No symptoms reported Is patient diabetic?: No    Gastrointestinal Gastrointestinal Symptoms Reported: No symptoms reported      Genitourinary Genitourinary Symptoms Reported: Incontinence Genitourinary Management Strategies: Incontinence garment/pad  Integumentary Integumentary Symptoms Reported: Other, Incision Other Integumentary Symptoms: Patient reports on her legs and seeing a wound specialist Skin Management Strategies: Adequate rest  Musculoskeletal Musculoskelatal Symptoms Reviewed: Back pain, Difficulty walking Additional Musculoskeletal Details: Patient reports using a walker, patient reports hip pain, knees and hands Musculoskeletal Management Strategies: Medical device, Medication therapy      Psychosocial Psychosocial Symptoms Reported: No symptoms reported Additional Psychological Details: Patient reports a history of bi-polar Behavioral Management Strategies: Medication therapy Behavioral Health Comment: Patient reports that the medication is working Major Change/Loss/Stressor/Fears (CP): Medical condition, self      10/07/2023    PHQ2-9 Depression Screening   Little interest or pleasure in doing things    Feeling down, depressed, or hopeless    PHQ-2 - Total Score    Trouble falling or staying asleep, or sleeping too much    Feeling tired or having little energy    Poor appetite or overeating     Feeling bad about yourself -  or that you are  a failure or have let yourself or your family down    Trouble concentrating on things, such as reading the newspaper or watching television    Moving or speaking so slowly that other people could have noticed.  Or the opposite - being so fidgety or restless that you have been moving around a lot more than usual    Thoughts that you would be better off dead, or hurting yourself in some way    PHQ2-9 Total Score    If you checked off any problems, how difficult have these problems made it for you to do your work, take care of things at home, or get along with other people    Depression Interventions/Treatment      There were no vitals filed for this visit.  Medications Reviewed Today     Reviewed by Sherren Olam FORBES KEN (Social Worker) on 10/07/23 at 1208  Med List Status: <None>   Medication Order Taking? Sig Documenting Provider Last Dose Status Informant  albuterol  (VENTOLIN  HFA) 108 (90 Base) MCG/ACT inhaler [id number] No Inhale 2 puffs into the lungs every 4 (four) hours as needed for shortness of breath or wheezing. [provider] 09/28/2023 Morning Active Self, Pharmacy Records  ARIPiprazole  (ABILIFY ) 15 MG tablet [id number] No Take 15 mg by mouth daily. [provider] 09/28/2023 Morning Active Self, Pharmacy Records  aspirin  EC 81 MG tablet [id number] No Take 1 tablet (81 mg total) by mouth daily. Swallow whole. RevankarJennifer SAUNDERS, MD 09/28/2023 Morning Active Self, Pharmacy Records  atorvastatin  (LIPITOR) 80 MG tablet [id number]  Take 1 tablet (80 mg total) by mouth at bedtime. Henry Manuelita NOVAK, NP  Active   Calcium  Carb-Cholecalciferol (OYSTER SHELL CALCIUM  250+D) 250-3.125 MG-MCG TABS [id number] No Take 1 tablet by mouth daily. [provider] 09/29/2023 Noon Active Self, Pharmacy Records  Cholecalciferol (VITAMIN D-3) 125 MCG (5000 UT) TABS [id number] No Take 5,000 Units by mouth daily. [provider] 09/28/2023 Morning Active Self, Pharmacy Records   citalopram  (CELEXA ) 40 MG tablet [id number] No Take 40 mg by mouth daily. [provider] 09/28/2023 Morning Active Self, Pharmacy Records  clopidogrel  (PLAVIX ) 75 MG tablet [id number]  Take 1 tablet (75 mg total) by mouth daily with breakfast. Henry Manuelita B, NP  Active   clotrimazole (LOTRIMIN) 1 % cream [id number] No Apply 1 Application topically daily as needed. [provider] Past Week Active Self, Pharmacy Records  fluticasone [facility]) 50 MCG/ACT nasal spray [id number] No Place 2 sprays into both nostrils 2 (two) times daily. [provider] Past Week Active Self, Pharmacy Records  gabapentin  (NEURONTIN ) 400 MG capsule [id number] No Take 400 mg by mouth 3 (three) times daily. [provider] 09/28/2023 Morning Active Self, Pharmacy Records  HYDROcodone -acetaminophen  (NORCO) 10-325 MG tablet [id number] No Take 1 tablet by mouth 3 (three) times daily as needed for moderate pain (pain score 4-6) or severe pain (pain score 7-10). [provider] Past Month Active Self, Pharmacy Records  ipratropium-albuterol  (DUONEB) 0.5-2.5 (3) MG/3ML SOLN [id number] No SMARTSIG:1 Vial(s) Every 6 Hours [provider] Past Week Active Self, Pharmacy Records  isosorbide  mononitrate (IMDUR ) 30 MG 24 hr tablet [id number] No Take 0.5 tablets (15 mg total) by mouth daily. RevankarJennifer SAUNDERS, MD 09/28/2023 Morning Active Self, Pharmacy Records  lidocaine  (LIDODERM ) 5 % [id number] No Place 1 patch onto the skin daily as needed (for pain). [provider] Past Week Active Self, Pharmacy Records  losartan  (COZAAR ) 25 MG tablet [id number]  No Take 25 mg by mouth daily. [provider] 09/28/2023 Morning Active Self, Pharmacy Records  methocarbamol  (ROBAXIN ) 500 MG tablet [id number] No Take 500 mg by mouth 2 (two) times daily. [provider] 09/28/2023 Morning Active Self, Pharmacy Records           Med Note (SATTERFIELD, TEENA BRAVO   Wed Sep 29, 2023 10:12  PM) Patient verified she takes every day   metroNIDAZOLE  (FLAGYL ) 500 MG tablet [id number]  Take 1 tablet (500 mg total) by mouth 2 (two) times daily.   Active   Multiple Vitamin (MULTIVITAMIN ADULT PO) [id number] No Take 1 tablet by mouth daily. [provider] 09/28/2023 Morning Active Self, Pharmacy Records  naloxone [facility]) nasal spray 4 mg/0.1 mL [id number] No Place 1 spray into the nose daily as needed (overdose). [provider] Unknown Active Self, Pharmacy Records  nitroGLYCERIN  (NITROSTAT ) 0.4 MG SL tablet [id number] No Place 1 tablet (0.4 mg total) under the tongue every 5 (five) minutes as needed for chest pain. Carlin Delon BROCKS, NP 09/24/2023 Active Self, Pharmacy Records           Med Note (GARNER, TIFFANY L   Thu Sep 16, 2023 10:13 AM)    ondansetron  (ZOFRAN -ODT) 4 MG disintegrating tablet [id number] No Take 4 mg by mouth every 6 (six) hours as needed for nausea or vomiting. [provider] Past Month Active Self, Pharmacy Records           Med Note (SATTERFIELD, TEENA BRAVO   Wed Sep 29, 2023 10:13 PM) Still has on hand   PREMARIN vaginal cream [id number] No Place 0.5 g vaginally 2 (two) times a week. Take on Sundays and Thursdays per patient [provider] 09/26/2023 Active Self, Pharmacy Records  QUEtiapine  (SEROQUEL ) 300 MG tablet [id number] No Take 300 mg by mouth at bedtime. [provider] 09/28/2023 Bedtime Active Self, Pharmacy Records  Discontinued 12/28/22 0900 (Dose change)   torsemide  (DEMADEX ) 20 MG tablet [id number]  Take 1 tablet (20 mg total) by mouth 2 (two) times daily. Henry Manuelita NOVAK, NP  Active   traZODone  (DESYREL ) 50 MG tablet [id number] No Take 100 mg by mouth at bedtime. [provider] 09/28/2023 Bedtime Active Self, Pharmacy Records  [facility] ELLIPTA 200-62.5-25 MCG/ACT AEPB [id number] No Inhale 1 puff into the lungs daily. [provider] 09/28/2023 Morning Active Self, Pharmacy Records             Recommendation:   Continue Current Plan of Care  Follow Up Plan:   Telephone follow-up 10/18/2023 at 10:30 am  Olam Ally, MSW, LCSW [place]  [facility], [facility] Licensed Clinical Social Worker Direct Dial: [phone]

## 2023-10-07 NOTE — Patient Instructions (Signed)
 Visit Information  Thank you for taking time to visit with me today. Please don't hesitate to contact me if I can be of assistance to you before our next scheduled appointment.  Your next care management appointment is by telephone on 10/18/2023 at 10:30 am    Please call the care guide team at [phone] if you need to cancel, schedule, or reschedule an appointment.   Please call 911 if you are experiencing a Mental Health or Behavioral Health Crisis or need someone to talk to.  Olam Ally, MSW, LCSW   [facility], [facility] Licensed Clinical Social Worker Direct Dial: [phone]

## 2023-10-08 ENCOUNTER — Ambulatory Visit: Attending: Cardiology | Admitting: Cardiology

## 2023-10-08 ENCOUNTER — Encounter: Payer: Self-pay | Admitting: Cardiology

## 2023-10-08 VITALS — BP 90/60 | HR 88 | Ht 60.0 in | Wt 146.0 lb

## 2023-10-08 DIAGNOSIS — R0609 Other forms of dyspnea: Secondary | ICD-10-CM

## 2023-10-08 DIAGNOSIS — E7841 Elevated Lipoprotein(a): Secondary | ICD-10-CM | POA: Diagnosis not present

## 2023-10-08 DIAGNOSIS — I5032 Chronic diastolic (congestive) heart failure: Secondary | ICD-10-CM

## 2023-10-08 DIAGNOSIS — I2510 Atherosclerotic heart disease of native coronary artery without angina pectoris: Secondary | ICD-10-CM

## 2023-10-08 DIAGNOSIS — E782 Mixed hyperlipidemia: Secondary | ICD-10-CM | POA: Diagnosis not present

## 2023-10-08 MED ORDER — LOSARTAN POTASSIUM 25 MG PO TABS: 12.5000 mg | ORAL_TABLET | Freq: Every day | ORAL | 3 refills | Status: DC

## 2023-10-08 MED ORDER — NITROGLYCERIN 0.4 MG SL SUBL: 0.4000 mg | SUBLINGUAL_TABLET | SUBLINGUAL | 3 refills | Status: AC | PRN

## 2023-10-08 NOTE — Patient Instructions (Signed)
 Medication Instructions:  Your physician has recommended you make the following change in your medication:   START: Losartan  12.5 mg daily at bed time  *If you need a refill on your cardiac medications before your next appointment, please call your pharmacy*  Lab Work: Your physician recommends that you return for lab work in:   Labs today: BMP, CBC  If you have labs (blood work) drawn today and your tests are completely normal, you will receive your results only by: MyChart Message (if you have MyChart) OR A paper copy in the mail If you have any lab test that is abnormal or we need to change your treatment, we will call you to review the results.  Testing/Procedures: None  Follow-Up: At [facility], you and your health needs are our priority.  As part of our continuing mission to provide you with exceptional heart care, our providers are all part of one team.  This team includes your primary Cardiologist (physician) and Advanced Practice Providers or APPs (Physician Assistants and Nurse Practitioners) who all work together to provide you with the care you need, when you need it.  Your next appointment:   8 week(s)  Provider:   Jennifer Crape, MD    We recommend signing up for the patient portal called MyChart.  Sign up information is provided on this After Visit Summary.  MyChart is used to connect with patients for Virtual Visits (Telemedicine).  Patients are able to view lab/test results, encounter notes, upcoming appointments, etc.  Non-urgent messages can be sent to your provider as well.   To learn more about what you can do with MyChart, go to [email or web address].   Other Instructions None

## 2023-10-09 LAB — BASIC METABOLIC PANEL WITH GFR
BUN/Creatinine Ratio: 8 — ABNORMAL LOW (ref 12–28)
BUN: 8 mg/dL (ref 8–27)
CO2: 22 mmol/L (ref 20–29)
Calcium: 10.1 mg/dL (ref 8.7–10.3)
Chloride: 99 mmol/L (ref 96–106)
Creatinine, Ser: 0.95 mg/dL (ref 0.57–1.00)
Glucose: 104 mg/dL — ABNORMAL HIGH (ref 70–99)
Potassium: 4.2 mmol/L (ref 3.5–5.2)
Sodium: 137 mmol/L (ref 134–144)
eGFR: 67 mL/min/1.73

## 2023-10-09 LAB — CBC
Hematocrit: 31.9 % — ABNORMAL LOW (ref 34.0–46.6)
Hemoglobin: 9.9 g/dL — ABNORMAL LOW (ref 11.1–15.9)
MCH: 30 pg (ref 26.6–33.0)
MCHC: 31 g/dL — ABNORMAL LOW (ref 31.5–35.7)
MCV: 97 fL (ref 79–97)
Platelets: 543 x10E3/uL — ABNORMAL HIGH (ref 150–450)
RBC: 3.3 x10E6/uL — ABNORMAL LOW (ref 3.77–5.28)
RDW: 17.4 % — ABNORMAL HIGH (ref 11.7–15.4)
WBC: 8.9 x10E3/uL (ref 3.4–10.8)

## 2023-10-11 ENCOUNTER — Ambulatory Visit: Payer: Self-pay | Admitting: Cardiology

## 2023-10-11 ENCOUNTER — Other Ambulatory Visit (HOSPITAL_BASED_OUTPATIENT_CLINIC_OR_DEPARTMENT_OTHER): Admitting: Radiology

## 2023-10-12 ENCOUNTER — Other Ambulatory Visit: Payer: Self-pay

## 2023-10-13 ENCOUNTER — Telehealth: Payer: Self-pay

## 2023-10-13 ENCOUNTER — Other Ambulatory Visit: Payer: Self-pay

## 2023-10-13 MED ORDER — CLOPIDOGREL BISULFATE 75 MG PO TABS: 75.0000 mg | ORAL_TABLET | Freq: Every day | ORAL | 3 refills | Status: AC

## 2023-10-13 NOTE — Patient Instructions (Signed)
 Visit Information  Thank you for taking time to visit with me today. Please don't hesitate to contact me if I can be of assistance to you before our next scheduled appointment.  Your next care management appointment is by telephone on 10/25/2023 at 11AM  Telephone follow up appointment date/time:  10/25/2023 at 11AM  Please call the care guide team at [phone] if you need to cancel, schedule, or reschedule an appointment.   Please call the Suicide and Crisis Lifeline: 988 go to [facility] Urgent [facility] [address] [place] [phone]) call 911 if you are experiencing a Mental Health or Behavioral Health Crisis or need someone to talk to.  Laymon Doll, BSW [place]/VBCI - [employer] Social Worker [phone]

## 2023-10-13 NOTE — Patient Outreach (Signed)
 Complex Care Management   Visit Note  10/13/2023  Name:  Tracy Oconnor MRN: [id number] DOB: 12-26-1960  Situation: Referral received for Complex Care Management related to motorized scooter need I obtained verbal consent from Patient.  Visit completed with Patient  on the phone  Background:   Past Medical History:  Diagnosis Date   Aortic atherosclerosis (HCC) 07/29/2022   Atherosclerosis    Bilateral carpal tunnel syndrome 01/30/2021   Bipolar disorder (HCC)    Cervical radiculopathy 01/30/2021   Chronic pain syndrome 07/06/2023   COPD (chronic obstructive pulmonary disease) (HCC)    Coronary artery disease 07/29/2022   Diastolic dysfunction 06/07/2023   DOE (dyspnea on exertion) 07/29/2022   Edema    Elevated lipoprotein A level    2024   Enteritis 12/17/2020   Fatigue    Hyperlipidemia    Hypertension    Hyponatremia 06/07/2023   Intermittent chest pain    Long term (current) use of opiate analgesic 07/06/2023   Mitral regurgitation    Multiple open wounds of lower extremity 06/07/2023   Murmur    Obesity (BMI 30.0-34.9) 07/29/2022   OSA (obstructive sleep apnea)    Palpitations    Spondylosis 07/06/2023    Assessment: BSW held f/u appt with pt. Pt was alert and cognitive. Pt reports being on vacation and feeling better after being in hospital. Pt confirmed she is on the MOW waitlist. Pt reports she has not followed up with CM at [facility] regarding her motorized scooter request. Pt states CM is aware and doctor has approved order for scooter, but has not been able to get one. BSW will call CM at [facility] to get a better understanding of the situation and provide pt an update. BSW asked pt if she needed additional resources and pt stated no. No additional resources were provided/requested at this time.   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 Services are free.] Other (Comment)  [Will schedule with BSW]  Utilities Interventions -- Intervention Not Indicated Intervention Not Indicated      Recommendation:   F/u with CM regarding scooter request.  Follow Up Plan:   Telephone follow up appointment date/time:  10/25/2023 at 11AM  Laymon Doll, BSW [place]/VBCI - [facility] Social Worker [phone]

## 2023-10-18 ENCOUNTER — Other Ambulatory Visit: Payer: Self-pay

## 2023-10-18 NOTE — Patient Instructions (Signed)
 Visit Information  Thank you for taking time to visit with me today. Please don't hesitate to contact me if I can be of assistance to you before our next scheduled appointment.  Your next care management appointment is by telephone on 11/08/2023 at 11:00 am  Please call the care guide team at [phone] if you need to cancel, schedule, or reschedule an appointment.   Please call 911 if you are experiencing a Mental Health or Behavioral Health Crisis or need someone to talk to.  Olam Ally, MSW, LCSW [place]  [facility], [facility] Licensed Clinical Social Worker Direct Dial: [phone]

## 2023-10-18 NOTE — Patient Outreach (Addendum)
 Complex Care Management   Visit Note  10/18/2023  Name:  Tracy Oconnor MRN: [id number] DOB: 13-Mar-1960  Situation: Referral received for Complex Care Management related to SDOH Barriers:  Transportation,Food insecurity I obtained verbal consent from Patient. Patient is also seeking assistance with securing a scooter.  Visit completed with patient  on the phone. LCSW met with patient for scheduled visit. Patient explained that she followed up with PCP on Friday and was informed that they were able to contact the agency about the scooter and now awaiting the paperwork.Patient has an appointment with BSW on 10/25/2023 . Patient identify no other needs at this time.   Background:   Past Medical History:  Diagnosis Date   Aortic atherosclerosis (HCC) 07/29/2022   Atherosclerosis    Bilateral carpal tunnel syndrome 01/30/2021   Bipolar disorder (HCC)    Cervical radiculopathy 01/30/2021   Chronic pain syndrome 07/06/2023   COPD (chronic obstructive pulmonary disease) (HCC)    Coronary artery disease 07/29/2022   Diastolic dysfunction 06/07/2023   DOE (dyspnea on exertion) 07/29/2022   Edema    Elevated lipoprotein A level    2024   Enteritis 12/17/2020   Fatigue    Hyperlipidemia    Hypertension    Hyponatremia 06/07/2023   Intermittent chest pain    Long term (current) use of opiate analgesic 07/06/2023   Mitral regurgitation    Multiple open wounds of lower extremity 06/07/2023   Murmur    Obesity (BMI 30.0-34.9) 07/29/2022   OSA (obstructive sleep apnea)    Palpitations    Spondylosis 07/06/2023    Assessment: Patient Reported Symptoms:  Cognitive Cognitive Status: Normal speech and language skills, Alert and oriented to person, place, and time Cognitive/Intellectual Conditions Management [RPT]: None reported or documented in medical history or problem list      Neurological Neurological Review of Symptoms: Not assessed    HEENT HEENT Symptoms Reported: Not assessed       Cardiovascular Cardiovascular Symptoms Reported: Not assessed    Respiratory Respiratory Symptoms Reported: Not assesed    Endocrine Endocrine Symptoms Reported: Not assessed    Gastrointestinal Gastrointestinal Symptoms Reported: Not assessed      Genitourinary Genitourinary Symptoms Reported: Not assessed    Integumentary Integumentary Symptoms Reported: Not assessed    Musculoskeletal Musculoskelatal Symptoms Reviewed: Not assessed        Psychosocial Psychosocial Symptoms Reported: Not assessed          10/18/2023    PHQ2-9 Depression Screening   Little interest or pleasure in doing things    Feeling down, depressed, or hopeless    PHQ-2 - Total Score    Trouble falling or staying asleep, or sleeping too much    Feeling tired or having little energy    Poor appetite or overeating     Feeling bad about yourself - or that you are a failure or have let yourself or your family down    Trouble concentrating on things, such as reading the newspaper or watching television    Moving or speaking so slowly that other people could have noticed.  Or the opposite - being so fidgety or restless that you have been moving around a lot more than usual    Thoughts that you would be better off dead, or hurting yourself in some way    PHQ2-9 Total Score    If you checked off any problems, how difficult have these problems made it for you to do your work, take care of  things at home, or get along with other people    Depression Interventions/Treatment      There were no vitals filed for this visit.  Medications Reviewed Today     Reviewed by Sherren Olam FORBES KEN (Social Worker) on 10/18/23 at 1116  Med List Status: <None>   Medication Order Taking? Sig Documenting Provider Last Dose Status Informant  albuterol  (VENTOLIN  HFA) 108 (90 Base) MCG/ACT inhaler [id number]  Inhale 2 puffs into the lungs every 4 (four) hours as needed for shortness of breath or wheezing. [provider]  Active Self, Pharmacy Records  ARIPiprazole  (ABILIFY ) 15 MG tablet [id number]  Take 15 mg by mouth daily. [provider]  Active Self, Pharmacy Records  aspirin  EC 81 MG tablet [id number]  Take 1 tablet (81 mg total) by mouth daily. Swallow whole. Revankar, Jennifer SAUNDERS, MD  Active Self, Pharmacy Records  atorvastatin  (LIPITOR) 80 MG tablet [id number]  Take 1 tablet (80 mg total) by mouth at bedtime. Henry Manuelita NOVAK, NP  Active   Calcium  Carb-Cholecalciferol (OYSTER SHELL CALCIUM  250+D) 250-3.125 MG-MCG TABS [id number]  Take 1 tablet by mouth daily. [provider]  Active Self, Pharmacy Records  Cholecalciferol (VITAMIN D-3) 125 MCG (5000 UT) TABS [id number]  Take 5,000 Units by mouth daily. [provider]  Active Self, Pharmacy Records  citalopram  (CELEXA ) 40 MG tablet [id number]  Take 40 mg by mouth daily. [provider]  Active Self, Pharmacy Records  clopidogrel  (PLAVIX ) 75 MG tablet [id number]  Take 1 tablet (75 mg total) by mouth daily with breakfast. Henry Manuelita B, NP  Active   gabapentin  (NEURONTIN ) 400 MG capsule [id number]  Take 400 mg by mouth 3 (three) times daily. [provider]  Active Self, Pharmacy Records  HYDROcodone -acetaminophen  [facility]) 10-325 MG tablet [id number]  Take 1 tablet by mouth 3 (three) times daily as needed for moderate pain (pain score 4-6) or severe pain (pain score 7-10). [provider]  Active Self, Pharmacy Records  isosorbide  mononitrate (IMDUR ) 30 MG 24 hr tablet [id number]  Take 0.5 tablets (15 mg total) by mouth daily. Revankar, Jennifer SAUNDERS, MD  Active Self, Pharmacy Records  lidocaine  (LIDODERM ) 5 % [id number]  Place 1 patch onto the skin daily as needed (for pain). [provider]  Active Self, Pharmacy Records  losartan  (COZAAR ) 25 MG tablet [id number]  Take 0.5 tablets (12.5 mg total) by mouth at bedtime. Carlin Delon BROCKS, NP  Active   methocarbamol  (ROBAXIN ) 500 MG tablet [id number]   Take 500 mg by mouth 2 (two) times daily. [provider]  Active Self, Pharmacy Records           Med Note (SATTERFIELD, TEENA FORBES   Wed Sep 29, 2023 10:12 PM) Patient verified she takes every day   metroNIDAZOLE  (FLAGYL ) 500 MG tablet [id number]  Take 1 tablet (500 mg total) by mouth 2 (two) times daily.   Active   Multiple Vitamin (MULTIVITAMIN ADULT PO) [id number]  Take 1 tablet by mouth daily. [provider]  Active Self, Pharmacy Records  naloxone [facility]) nasal spray 4 mg/0.1 mL [id number]  Place 1 spray into the nose daily as needed (overdose). [provider]  Active Self, Pharmacy Records  nitroGLYCERIN  (NITROSTAT ) 0.4 MG SL tablet [id number]  Place 1 tablet (0.4 mg total) under the tongue every 5 (five) minutes as needed for chest pain. Carlin Delon BROCKS, NP  Active   PREMARIN vaginal cream [id number]  Place 0.5 g vaginally 2 (two) times a week. Take  on Sundays and Thursdays per patient [provider]  Active Self, Pharmacy Records  QUEtiapine  (SEROQUEL ) 300 MG tablet [id number]  Take 300 mg by mouth at bedtime. [provider]  Active Self, Pharmacy Records  Discontinued 12/28/22 0900 (Dose change)   torsemide  (DEMADEX ) 20 MG tablet [id number]  Take 1 tablet (20 mg total) by mouth 2 (two) times daily. Henry Manuelita NOVAK, NP  Active   traZODone  (DESYREL ) 50 MG tablet [id number]  Take 100 mg by mouth at bedtime. [provider]  Active Self, Pharmacy Records  [facility] ELLIPTA 200-62.5-25 MCG/ACT AEPB [id number]  Inhale 1 puff into the lungs daily. [provider]  Active Self, Pharmacy Records            Recommendation:   Continue Current Plan of Care  Follow Up Plan:   Telephone follow-up 11/08/2023 at 11:00 am  Olam Ally, MSW, LCSW [place]  [facility], [facility] Licensed Clinical Social Worker Direct Dial: [phone]

## 2023-10-19 DIAGNOSIS — Z1231 Encounter for screening mammogram for malignant neoplasm of breast: Secondary | ICD-10-CM | POA: Diagnosis not present

## 2023-10-19 DIAGNOSIS — Z9861 Coronary angioplasty status: Secondary | ICD-10-CM | POA: Diagnosis not present

## 2023-10-19 DIAGNOSIS — Z6827 Body mass index (BMI) 27.0-27.9, adult: Secondary | ICD-10-CM | POA: Diagnosis not present

## 2023-10-19 DIAGNOSIS — Z955 Presence of coronary angioplasty implant and graft: Secondary | ICD-10-CM | POA: Diagnosis not present

## 2023-10-19 DIAGNOSIS — I2510 Atherosclerotic heart disease of native coronary artery without angina pectoris: Secondary | ICD-10-CM | POA: Diagnosis not present

## 2023-10-19 DIAGNOSIS — Z789 Other specified health status: Secondary | ICD-10-CM | POA: Diagnosis not present

## 2023-10-20 ENCOUNTER — Ambulatory Visit (HOSPITAL_BASED_OUTPATIENT_CLINIC_OR_DEPARTMENT_OTHER)
Admission: RE | Admit: 2023-10-20 | Discharge: 2023-10-20 | Disposition: A | Discharge status: Home / Self Care | Source: Ambulatory Visit | Attending: Physician Assistant | Admitting: Physician Assistant

## 2023-10-20 DIAGNOSIS — E2839 Other primary ovarian failure: Secondary | ICD-10-CM

## 2023-10-20 DIAGNOSIS — Z780 Asymptomatic menopausal state: Secondary | ICD-10-CM | POA: Diagnosis not present

## 2023-10-20 DIAGNOSIS — M8589 Other specified disorders of bone density and structure, multiple sites: Secondary | ICD-10-CM

## 2023-10-20 DIAGNOSIS — M810 Age-related osteoporosis without current pathological fracture: Secondary | ICD-10-CM | POA: Diagnosis not present

## 2023-10-25 ENCOUNTER — Other Ambulatory Visit: Payer: Self-pay

## 2023-10-25 NOTE — Patient Outreach (Signed)
 Complex Care Management   Visit Note  10/25/2023  Name:  Tracy Oconnor MRN: [id number] DOB: [birth year]  Situation: Referral received for Complex Care Management related to motorized scooter I obtained verbal consent from Patient.  Visit completed with Patient  on the phone  Background:   Past Medical History:  Diagnosis Date   Aortic atherosclerosis (HCC) 07/29/2022   Atherosclerosis    Bilateral carpal tunnel syndrome 01/30/2021   Bipolar disorder (HCC)    Cervical radiculopathy 01/30/2021   Chronic pain syndrome 07/06/2023   COPD (chronic obstructive pulmonary disease) (HCC)    Coronary artery disease 07/29/2022   Diastolic dysfunction 06/07/2023   DOE (dyspnea on exertion) 07/29/2022   Edema    Elevated lipoprotein A level    2024   Enteritis 12/17/2020   Fatigue    Hyperlipidemia    Hypertension    Hyponatremia 06/07/2023   Intermittent chest pain    Long term (current) use of opiate analgesic 07/06/2023   Mitral regurgitation    Multiple open wounds of lower extremity 06/07/2023   Murmur    Obesity (BMI 30.0-34.9) 07/29/2022   OSA (obstructive sleep apnea)    Palpitations    Spondylosis 07/06/2023    Assessment: BSW held f/u appt with pt. Pt was alert and cognitive. Pt confirmed no needs at this time. Pt states has begun receiving home aide services for 2.5 hours a day Monday - Sunday. Pt also reports she is working with CM at [place] on paperwork for scooter and is working to get an updated evaluation note for scooter from provider. Pt reports Dr. Melonie does not want to provide note until she finishes physical therapy. Pt states she will try with other provider next month to get note evaluation for scooter. No additional resources were provided/requested at this time.   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 Services are free.] Other (Comment)  [Will schedule with BSW]  Utilities Interventions -- Intervention Not Indicated Intervention Not Indicated      Recommendation:   F/u with providers re updated evaluation note for scooter.   Follow Up Plan:   Telephone follow up appointment date/time:  11/09/2023 at 11AM.  Laymon Doll, BSW [place]/[facility] - [facility] Social Worker [phone]

## 2023-10-25 NOTE — Patient Instructions (Signed)
 Visit Information  Thank you for taking time to visit with me today. Please don't hesitate to contact me if I can be of assistance to you before our next scheduled appointment.  Your next care management appointment is by telephone on 11/09/2023 at 11Am  Telephone follow up appointment date/time:  11/09/2023 at 11Am.  Please call the care guide team at [phone] if you need to cancel, schedule, or reschedule an appointment.   Please call the Suicide and Crisis Lifeline: 988 call 911 if you are experiencing a Mental Health or Behavioral Health Crisis or need someone to talk to.  Laymon Doll, BSW [place]/[facility]I - [employer] Social Worker [phone]

## 2023-10-26 DIAGNOSIS — I5022 Chronic systolic (congestive) heart failure: Secondary | ICD-10-CM | POA: Diagnosis not present

## 2023-10-26 DIAGNOSIS — I700 Atherosclerosis of aorta: Secondary | ICD-10-CM | POA: Diagnosis not present

## 2023-10-26 DIAGNOSIS — F319 Bipolar disorder, unspecified: Secondary | ICD-10-CM | POA: Diagnosis not present

## 2023-10-26 DIAGNOSIS — J9611 Chronic respiratory failure with hypoxia: Secondary | ICD-10-CM | POA: Diagnosis not present

## 2023-10-26 DIAGNOSIS — I25119 Atherosclerotic heart disease of native coronary artery with unspecified angina pectoris: Secondary | ICD-10-CM | POA: Diagnosis not present

## 2023-10-26 DIAGNOSIS — I110 Hypertensive heart disease with heart failure: Secondary | ICD-10-CM | POA: Diagnosis not present

## 2023-10-26 DIAGNOSIS — J439 Emphysema, unspecified: Secondary | ICD-10-CM | POA: Diagnosis not present

## 2023-10-26 DIAGNOSIS — I5030 Unspecified diastolic (congestive) heart failure: Secondary | ICD-10-CM | POA: Diagnosis not present

## 2023-10-26 DIAGNOSIS — Z48812 Encounter for surgical aftercare following surgery on the circulatory system: Secondary | ICD-10-CM | POA: Diagnosis not present

## 2023-10-27 DIAGNOSIS — L97912 Non-pressure chronic ulcer of unspecified part of right lower leg with fat layer exposed: Secondary | ICD-10-CM | POA: Diagnosis not present

## 2023-10-28 DIAGNOSIS — H353131 Nonexudative age-related macular degeneration, bilateral, early dry stage: Secondary | ICD-10-CM | POA: Diagnosis not present

## 2023-10-29 DIAGNOSIS — M170 Bilateral primary osteoarthritis of knee: Secondary | ICD-10-CM | POA: Diagnosis not present

## 2023-11-03 DIAGNOSIS — Z124 Encounter for screening for malignant neoplasm of cervix: Secondary | ICD-10-CM | POA: Diagnosis not present

## 2023-11-03 DIAGNOSIS — Z01419 Encounter for gynecological examination (general) (routine) without abnormal findings: Secondary | ICD-10-CM | POA: Diagnosis not present

## 2023-11-03 DIAGNOSIS — F319 Bipolar disorder, unspecified: Secondary | ICD-10-CM | POA: Diagnosis not present

## 2023-11-03 DIAGNOSIS — J449 Chronic obstructive pulmonary disease, unspecified: Secondary | ICD-10-CM | POA: Diagnosis not present

## 2023-11-04 DIAGNOSIS — I5030 Unspecified diastolic (congestive) heart failure: Secondary | ICD-10-CM | POA: Diagnosis not present

## 2023-11-04 DIAGNOSIS — Z48812 Encounter for surgical aftercare following surgery on the circulatory system: Secondary | ICD-10-CM | POA: Diagnosis not present

## 2023-11-04 DIAGNOSIS — J439 Emphysema, unspecified: Secondary | ICD-10-CM | POA: Diagnosis not present

## 2023-11-04 DIAGNOSIS — I25119 Atherosclerotic heart disease of native coronary artery with unspecified angina pectoris: Secondary | ICD-10-CM | POA: Diagnosis not present

## 2023-11-04 DIAGNOSIS — I5022 Chronic systolic (congestive) heart failure: Secondary | ICD-10-CM | POA: Diagnosis not present

## 2023-11-04 DIAGNOSIS — J9611 Chronic respiratory failure with hypoxia: Secondary | ICD-10-CM | POA: Diagnosis not present

## 2023-11-04 DIAGNOSIS — I700 Atherosclerosis of aorta: Secondary | ICD-10-CM | POA: Diagnosis not present

## 2023-11-04 DIAGNOSIS — I110 Hypertensive heart disease with heart failure: Secondary | ICD-10-CM | POA: Diagnosis not present

## 2023-11-04 DIAGNOSIS — F319 Bipolar disorder, unspecified: Secondary | ICD-10-CM | POA: Diagnosis not present

## 2023-11-08 ENCOUNTER — Other Ambulatory Visit: Payer: Self-pay

## 2023-11-08 DIAGNOSIS — E785 Hyperlipidemia, unspecified: Secondary | ICD-10-CM | POA: Diagnosis not present

## 2023-11-08 NOTE — Patient Instructions (Signed)
 Visit Information  Thank you for taking time to visit with me today. Please don't hesitate to contact me if I can be of assistance to you before our next scheduled appointment.  Your next care management appointment is by telephone on 11/22/2023 at 11:00 am   Please call the care guide team at [phone] if you need to cancel, schedule, or reschedule an appointment.   Please call 911 if you are experiencing a Mental Health or Behavioral Health Crisis or need someone to talk to.  Olam Ally, MSW, LCSW [place]  [facility], [facility] Licensed Clinical Social Worker Direct Dial: [phone]

## 2023-11-08 NOTE — Patient Outreach (Signed)
 Complex Care Management   Visit Note  11/08/2023  Name:  Tracy Oconnor MRN: [id number] DOB: 1960-07-08  Situation: Referral received for Complex Care Management related to SDOH Barriers:  Transportation Food insecurity I obtained verbal consent from Patient.  Visit completed with patient  on the phone. Patient has another appointment with BSW on 11/09/2023. Background:   Past Medical History:  Diagnosis Date   Aortic atherosclerosis 07/29/2022   Atherosclerosis    Bilateral carpal tunnel syndrome 01/30/2021   Bipolar disorder (HCC)    Cervical radiculopathy 01/30/2021   Chronic pain syndrome 07/06/2023   COPD (chronic obstructive pulmonary disease) (HCC)    Coronary artery disease 07/29/2022   Diastolic dysfunction 06/07/2023   DOE (dyspnea on exertion) 07/29/2022   Edema    Elevated lipoprotein A level    2024   Enteritis 12/17/2020   Fatigue    Hyperlipidemia    Hypertension    Hyponatremia 06/07/2023   Intermittent chest pain    Long term (current) use of opiate analgesic 07/06/2023   Mitral regurgitation    Multiple open wounds of lower extremity 06/07/2023   Murmur    Obesity (BMI 30.0-34.9) 07/29/2022   OSA (obstructive sleep apnea)    Palpitations    Spondylosis 07/06/2023    Assessment: Patient Reported Symptoms:  Cognitive Cognitive Status: Normal speech and language skills, Alert and oriented to person, place, and time Cognitive/Intellectual Conditions Management [RPT]: None reported or documented in medical history or problem list      Neurological Neurological Review of Symptoms: No symptoms reported    HEENT HEENT Symptoms Reported: No symptoms reported      Cardiovascular Cardiovascular Symptoms Reported: No symptoms reported    Respiratory Other Respiratory Symptoms: Patient reports having COPD Respiratory Management Strategies: Oxygen  therapy  Endocrine Endocrine Symptoms Reported: No symptoms reported Is patient diabetic?: No     Gastrointestinal Gastrointestinal Symptoms Reported: No symptoms reported      Genitourinary Genitourinary Symptoms Reported: Frequency Other Genitourinary Symptoms: Patient reports urinating alot and is on medication Genitourinary Management Strategies: Incontinence garment/pad  Integumentary Integumentary Symptoms Reported: No symptoms reported Other Integumentary Symptoms: Patient reports wounds are healed    Musculoskeletal Musculoskelatal Symptoms Reviewed: Back pain Additional Musculoskeletal Details: Patient reports hip and knee pain Musculoskeletal Management Strategies: Medication therapy, Medical device Falls in the past year?: Yes Number of falls in past year: 2 or more Was there an injury with Fall?: Yes Fall Risk Category Calculator: 3 Patient Fall Risk Level: High Fall Risk Fall risk Follow up: Falls evaluation completed  Psychosocial Psychosocial Symptoms Reported: No symptoms reported Additional Psychological Details: Patient reports history of bi-polar Behavioral Management Strategies: Medication therapy Major Change/Loss/Stressor/Fears (CP): Medical condition, self Quality of Family Relationships: stressful, non-existent Do you feel physically threatened by others?: No    11/08/2023    PHQ2-9 Depression Screening   Little interest or pleasure in doing things    Feeling down, depressed, or hopeless    PHQ-2 - Total Score    Trouble falling or staying asleep, or sleeping too much    Feeling tired or having little energy    Poor appetite or overeating     Feeling bad about yourself - or that you are a failure or have let yourself or your family down    Trouble concentrating on things, such as reading the newspaper or watching television    Moving or speaking so slowly that other people could have noticed.  Or the opposite - being so fidgety or  restless that you have been moving around a lot more than usual    Thoughts that you would be better off dead, or hurting  yourself in some way    PHQ2-9 Total Score    If you checked off any problems, how difficult have these problems made it for you to do your work, take care of things at home, or get along with other people    Depression Interventions/Treatment      There were no vitals filed for this visit.  Medications Reviewed Today     Reviewed by Sherren Olam FORBES KEN (Social Worker) on 11/08/23 at 1054  Med List Status: <None>   Medication Order Taking? Sig Documenting Provider Last Dose Status Informant  albuterol  (VENTOLIN  HFA) 108 (90 Base) MCG/ACT inhaler [id number]  Inhale 2 puffs into the lungs every 4 (four) hours as needed for shortness of breath or wheezing. [provider]  Active Self, Pharmacy Records  ARIPiprazole  (ABILIFY ) 15 MG tablet [id number]  Take 15 mg by mouth daily. [provider]  Active Self, Pharmacy Records  aspirin  EC 81 MG tablet [id number]  Take 1 tablet (81 mg total) by mouth daily. Swallow whole. Revankar, Jennifer SAUNDERS, MD  Active Self, Pharmacy Records  atorvastatin  (LIPITOR) 80 MG tablet [id number]  Take 1 tablet (80 mg total) by mouth at bedtime. Henry Manuelita NOVAK, NP  Active   Calcium  Carb-Cholecalciferol (OYSTER SHELL CALCIUM  250+D) 250-3.125 MG-MCG TABS [id number]  Take 1 tablet by mouth daily. [provider]  Active Self, Pharmacy Records  Cholecalciferol (VITAMIN D-3) 125 MCG (5000 UT) TABS [id number]  Take 5,000 Units by mouth daily. [provider]  Active Self, Pharmacy Records  citalopram  (CELEXA ) 40 MG tablet [id number]  Take 40 mg by mouth daily. [provider]  Active Self, Pharmacy Records  clopidogrel  (PLAVIX ) 75 MG tablet [id number]  Take 1 tablet (75 mg total) by mouth daily with breakfast. Henry Manuelita B, NP  Active   gabapentin  (NEURONTIN ) 400 MG capsule [id number]  Take 400 mg by mouth 3 (three) times daily. [provider]  Active Self, Pharmacy Records  HYDROcodone -acetaminophen  [facility]) 10-325 MG tablet  [id number]  Take 1 tablet by mouth 3 (three) times daily as needed for moderate pain (pain score 4-6) or severe pain (pain score 7-10). [provider]  Active Self, Pharmacy Records  isosorbide  mononitrate (IMDUR ) 30 MG 24 hr tablet [id number]  Take 0.5 tablets (15 mg total) by mouth daily. Revankar, Jennifer SAUNDERS, MD  Active Self, Pharmacy Records  lidocaine  (LIDODERM ) 5 % [id number]  Place 1 patch onto the skin daily as needed (for pain). [provider]  Active Self, Pharmacy Records  losartan  (COZAAR ) 25 MG tablet [id number]  Take 0.5 tablets (12.5 mg total) by mouth at bedtime. Carlin Delon BROCKS, NP  Active   methocarbamol  (ROBAXIN ) 500 MG tablet [id number]  Take 500 mg by mouth 2 (two) times daily. [provider]  Active Self, Pharmacy Records           Med Note (SATTERFIELD, TEENA FORBES   Wed Sep 29, 2023 10:12 PM) Patient verified she takes every day   metroNIDAZOLE  (FLAGYL ) 500 MG tablet [id number]  Take 1 tablet (500 mg total) by mouth 2 (two) times daily.   Active   Multiple Vitamin (MULTIVITAMIN ADULT PO) [id number]  Take 1 tablet by mouth daily. [provider]  Active Self, Pharmacy Records  naloxone [facility]) nasal spray 4 mg/0.1 mL [id number]  Place 1 spray into the nose daily as  needed (overdose). [provider]  Active Self, Pharmacy Records  nitroGLYCERIN  (NITROSTAT ) 0.4 MG SL tablet [id number]  Place 1 tablet (0.4 mg total) under the tongue every 5 (five) minutes as needed for chest pain. Carlin Delon BROCKS, NP  Active   PREMARIN vaginal cream [id number]  Place 0.5 g vaginally 2 (two) times a week. Take on Sundays and Thursdays per patient [provider]  Active Self, Pharmacy Records  QUEtiapine  (SEROQUEL ) 300 MG tablet [id number]  Take 300 mg by mouth at bedtime. [provider]  Active Self, Pharmacy Records  Discontinued 12/28/22 0900 (Dose change)   torsemide  (DEMADEX ) 20 MG tablet [id number]  Take 1 tablet (20 mg total) by mouth 2  (two) times daily. Henry Manuelita NOVAK, NP  Active   traZODone  (DESYREL ) 50 MG tablet [id number]  Take 100 mg by mouth at bedtime. [provider]  Active Self, Pharmacy Records  [facility] ELLIPTA 200-62.5-25 MCG/ACT AEPB [id number]  Inhale 1 puff into the lungs daily. [provider]  Active Self, Pharmacy Records            Recommendation:   PCP Follow-up on 11/23/2023 Follow up with BSW and LCSW  Follow Up Plan:   Telephone follow-up 11/22/2023 at 11:00 am  Olam Ally, MSW, LCSW [place]  [facility], [facility] Licensed Clinical Social Worker Direct Dial: [phone]

## 2023-11-09 ENCOUNTER — Other Ambulatory Visit: Payer: Self-pay

## 2023-11-09 DIAGNOSIS — I25119 Atherosclerotic heart disease of native coronary artery with unspecified angina pectoris: Secondary | ICD-10-CM | POA: Diagnosis not present

## 2023-11-09 DIAGNOSIS — I5022 Chronic systolic (congestive) heart failure: Secondary | ICD-10-CM | POA: Diagnosis not present

## 2023-11-09 DIAGNOSIS — J9611 Chronic respiratory failure with hypoxia: Secondary | ICD-10-CM | POA: Diagnosis not present

## 2023-11-09 DIAGNOSIS — I5030 Unspecified diastolic (congestive) heart failure: Secondary | ICD-10-CM | POA: Diagnosis not present

## 2023-11-09 DIAGNOSIS — F319 Bipolar disorder, unspecified: Secondary | ICD-10-CM | POA: Diagnosis not present

## 2023-11-09 DIAGNOSIS — Z48812 Encounter for surgical aftercare following surgery on the circulatory system: Secondary | ICD-10-CM | POA: Diagnosis not present

## 2023-11-09 DIAGNOSIS — J439 Emphysema, unspecified: Secondary | ICD-10-CM | POA: Diagnosis not present

## 2023-11-09 DIAGNOSIS — I110 Hypertensive heart disease with heart failure: Secondary | ICD-10-CM | POA: Diagnosis not present

## 2023-11-09 DIAGNOSIS — I700 Atherosclerosis of aorta: Secondary | ICD-10-CM | POA: Diagnosis not present

## 2023-11-09 NOTE — Patient Outreach (Signed)
 Complex Care Management   Visit Note  11/09/2023  Name:  Tracy Oconnor MRN: [id number] DOB: [birth year]  Situation: Referral received for Complex Care Management related to SDOH Barriers:  Transportation Food insecurity Motorized scooter need I obtained verbal consent from Patient.  Visit completed with Patient  on the phone  Background:   Past Medical History:  Diagnosis Date   Aortic atherosclerosis 07/29/2022   Atherosclerosis    Bilateral carpal tunnel syndrome 01/30/2021   Bipolar disorder (HCC)    Cervical radiculopathy 01/30/2021   Chronic pain syndrome 07/06/2023   COPD (chronic obstructive pulmonary disease) (HCC)    Coronary artery disease 07/29/2022   Diastolic dysfunction 06/07/2023   DOE (dyspnea on exertion) 07/29/2022   Edema    Elevated lipoprotein A level    2024   Enteritis 12/17/2020   Fatigue    Hyperlipidemia    Hypertension    Hyponatremia 06/07/2023   Intermittent chest pain    Long term (current) use of opiate analgesic 07/06/2023   Mitral regurgitation    Multiple open wounds of lower extremity 06/07/2023   Murmur    Obesity (BMI 30.0-34.9) 07/29/2022   OSA (obstructive sleep apnea)    Palpitations    Spondylosis 07/06/2023    Assessment: BSW held f/u appt with pt. Pt was alert and cognitive. Pt reports she is doing well overall at this time. SDOH needs were reassessed and the food insecurity and transportation continue to be a need. However, pt declined referrals for food pantry resources at this time. Pt also states she relies on [employer] and family for transportation. However, [employer] did not pick her up as scheduled today. Pt will be reaching out to them. Pt states she is working with her CM at [facility] to coordinate therapy and expects to start it next week. Pt has been told she must finish therapy before scooter order can be processed. Pt declined further f/u appts as she states she is doing fine right now. BSW instructed pt how she can back on his  schedule should her needs change in the future. Pt understood and agreed for BSW to drop from care team. BSW will inform [facility]. No other resources were provided/requested at this time.   SDOH Interventions    Flowsheet Row Patient Outreach Telephone from 11/09/2023 in [place] POPULATION HEALTH DEPARTMENT Patient Outreach Telephone from 09/15/2023 in [place] POPULATION HEALTH DEPARTMENT Patient Outreach Telephone from 09/07/2023 in [place] POPULATION HEALTH DEPARTMENT Patient Outreach from 08/19/2023 in [place] POPULATION HEALTH DEPARTMENT  SDOH Interventions      Food Insecurity Interventions Patient Declined Patient Declined Patient Declined --  [Will schedule visit with BSW]  Housing Interventions Intervention Not Indicated -- Intervention Not Indicated Intervention Not Indicated  Transportation Interventions Community Resources Provided, Patient Resources (Friends/Family)  [Patient uses [employer] for medical transportation. 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   Follow Up Plan:   No further follow-up required. Patient declined additional follow up and states she is doing fine.   Laymon Doll, BSW [place]/VBCI - [employer] Social Worker [phone]

## 2023-11-09 NOTE — Patient Instructions (Signed)
 Visit Information  Thank you for taking time to visit with me today. Please don't hesitate to contact me if I can be of assistance to you before our next scheduled appointment.  Your next care management appointment is no further scheduled appointments.   No further follow up required. Patient states she is doing fine and declined additional BSW follow-up.   Please call the care guide team at [phone] if you need to cancel, schedule, or reschedule an appointment.   Please call the Suicide and Crisis Lifeline: 988 call 1-800-273-TALK (toll free, 24 hour hotline) call 911 if you are experiencing a Mental Health or Behavioral Health Crisis or need someone to talk to.  Laymon Doll, BSW [place]/[facility] - [employer] Social Worker [phone]

## 2023-11-12 DIAGNOSIS — F319 Bipolar disorder, unspecified: Secondary | ICD-10-CM | POA: Diagnosis not present

## 2023-11-16 DIAGNOSIS — I25119 Atherosclerotic heart disease of native coronary artery with unspecified angina pectoris: Secondary | ICD-10-CM | POA: Diagnosis not present

## 2023-11-17 DIAGNOSIS — L97912 Non-pressure chronic ulcer of unspecified part of right lower leg with fat layer exposed: Secondary | ICD-10-CM | POA: Diagnosis not present

## 2023-11-18 DIAGNOSIS — G4733 Obstructive sleep apnea (adult) (pediatric): Secondary | ICD-10-CM | POA: Diagnosis not present

## 2023-11-18 DIAGNOSIS — G4736 Sleep related hypoventilation in conditions classified elsewhere: Secondary | ICD-10-CM | POA: Diagnosis not present

## 2023-11-18 DIAGNOSIS — J449 Chronic obstructive pulmonary disease, unspecified: Secondary | ICD-10-CM | POA: Diagnosis not present

## 2023-11-18 DIAGNOSIS — J9611 Chronic respiratory failure with hypoxia: Secondary | ICD-10-CM | POA: Diagnosis not present

## 2023-11-22 ENCOUNTER — Telehealth

## 2023-11-23 DIAGNOSIS — F319 Bipolar disorder, unspecified: Secondary | ICD-10-CM | POA: Diagnosis not present

## 2023-11-23 DIAGNOSIS — E785 Hyperlipidemia, unspecified: Secondary | ICD-10-CM | POA: Diagnosis not present

## 2023-11-23 DIAGNOSIS — J449 Chronic obstructive pulmonary disease, unspecified: Secondary | ICD-10-CM | POA: Diagnosis not present

## 2023-11-23 DIAGNOSIS — I110 Hypertensive heart disease with heart failure: Secondary | ICD-10-CM | POA: Diagnosis not present

## 2023-11-23 DIAGNOSIS — Z6827 Body mass index (BMI) 27.0-27.9, adult: Secondary | ICD-10-CM | POA: Diagnosis not present

## 2023-11-25 DIAGNOSIS — J9611 Chronic respiratory failure with hypoxia: Secondary | ICD-10-CM | POA: Diagnosis not present

## 2023-11-25 DIAGNOSIS — Z48812 Encounter for surgical aftercare following surgery on the circulatory system: Secondary | ICD-10-CM | POA: Diagnosis not present

## 2023-11-25 DIAGNOSIS — I5030 Unspecified diastolic (congestive) heart failure: Secondary | ICD-10-CM | POA: Diagnosis not present

## 2023-11-25 DIAGNOSIS — I5022 Chronic systolic (congestive) heart failure: Secondary | ICD-10-CM | POA: Diagnosis not present

## 2023-11-25 DIAGNOSIS — J439 Emphysema, unspecified: Secondary | ICD-10-CM | POA: Diagnosis not present

## 2023-11-25 DIAGNOSIS — I110 Hypertensive heart disease with heart failure: Secondary | ICD-10-CM | POA: Diagnosis not present

## 2023-11-25 DIAGNOSIS — I700 Atherosclerosis of aorta: Secondary | ICD-10-CM | POA: Diagnosis not present

## 2023-11-25 DIAGNOSIS — F319 Bipolar disorder, unspecified: Secondary | ICD-10-CM | POA: Diagnosis not present

## 2023-11-25 DIAGNOSIS — I25119 Atherosclerotic heart disease of native coronary artery with unspecified angina pectoris: Secondary | ICD-10-CM | POA: Diagnosis not present

## 2023-11-30 ENCOUNTER — Other Ambulatory Visit: Payer: Self-pay | Admitting: Licensed Clinical Social Worker

## 2023-11-30 DIAGNOSIS — J9611 Chronic respiratory failure with hypoxia: Secondary | ICD-10-CM | POA: Diagnosis not present

## 2023-11-30 DIAGNOSIS — I5030 Unspecified diastolic (congestive) heart failure: Secondary | ICD-10-CM | POA: Diagnosis not present

## 2023-11-30 DIAGNOSIS — J439 Emphysema, unspecified: Secondary | ICD-10-CM | POA: Diagnosis not present

## 2023-11-30 DIAGNOSIS — I5022 Chronic systolic (congestive) heart failure: Secondary | ICD-10-CM | POA: Diagnosis not present

## 2023-11-30 DIAGNOSIS — I700 Atherosclerosis of aorta: Secondary | ICD-10-CM | POA: Diagnosis not present

## 2023-11-30 DIAGNOSIS — Z48812 Encounter for surgical aftercare following surgery on the circulatory system: Secondary | ICD-10-CM | POA: Diagnosis not present

## 2023-11-30 DIAGNOSIS — F319 Bipolar disorder, unspecified: Secondary | ICD-10-CM | POA: Diagnosis not present

## 2023-11-30 DIAGNOSIS — I110 Hypertensive heart disease with heart failure: Secondary | ICD-10-CM | POA: Diagnosis not present

## 2023-11-30 DIAGNOSIS — I25119 Atherosclerotic heart disease of native coronary artery with unspecified angina pectoris: Secondary | ICD-10-CM | POA: Diagnosis not present

## 2023-11-30 NOTE — Patient Instructions (Signed)
 Visit Information  Thank you for taking time to visit with me today. Please don't hesitate to contact me if I can be of assistance to you before our next scheduled appointment.  Our next appointment is by telephone on 12/27/23 at 11am. Please call the care guide team at [phone] if you need to cancel or reschedule your appointment.   Following is a copy of your care plan:   Goals Addressed             This Visit's Progress    VBCI Social Work Care Plan: LCSW       Problems:   Geophysicist/field Seismologist  CSW Clinical Goal(s):   Over the next 90 days the Patient will work with Child Psychotherapist to address concerns related to SDOH needs.  Interventions:  Social Determinants of Health in Patient with COPD: SDOH assessments completed: Programme Researcher, Broadcasting/film/video of current treatment plan related to unmet needs Patient reports needing to complete therapy before filling out paperwork for scooter. Patient is reporting everything is going well. LCSW completed assessments Patient is reporting no mental health concerns. Patient reports still being on list for meals on wheels. LCSW encouraged patient to still take medication for mental health diagnosis. BSW closed out SDOH referral Patient reports SDOH needs are met Patient will be disenrolled from [facility] on 12/07/23 Patient will begin [facility] on 12/14/23 Patient uses [facility] to get to appointments  Patient Goals/Self-Care Activities:  Follow up with community resources when provided.  Plan:   Telephone follow up appointment with care management team member scheduled for: 12/27/2023 at 11:00 am        Please call the Suicide and Crisis Lifeline: 988 call the [place]  National Suicide Prevention Lifeline: 671-107-8140 or TTY: 563-136-7121 TTY (857) 519-9868) to talk to a trained counselor call 911 if you are experiencing a Mental Health or Behavioral Health Crisis or need someone to talk  to.  Patient verbalized understanding of Care plan and visit instructions communicated this visit  Cena Ligas, LCSW Clinical Social Worker [facility] [employer]

## 2023-11-30 NOTE — Patient Outreach (Signed)
 Complex Care Management   Visit Note  11/30/2023  Name:  Tracy Oconnor MRN: [id number] DOB: Dec 03, 1960  Situation: Referral received for Complex Care Management related to SDOH Barriers:  Transportation I obtained verbal consent from Patient POA.  Visit completed with Patient  on the phone.  Background:   Past Medical History:  Diagnosis Date   Aortic atherosclerosis 07/29/2022   Atherosclerosis    Bilateral carpal tunnel syndrome 01/30/2021   Bipolar disorder (HCC)    Cervical radiculopathy 01/30/2021   Chronic pain syndrome 07/06/2023   COPD (chronic obstructive pulmonary disease) (HCC)    Coronary artery disease 07/29/2022   Diastolic dysfunction 06/07/2023   DOE (dyspnea on exertion) 07/29/2022   Edema    Elevated lipoprotein A level    2024   Enteritis 12/17/2020   Fatigue    Hyperlipidemia    Hypertension    Hyponatremia 06/07/2023   Intermittent chest pain    Long term (current) use of opiate analgesic 07/06/2023   Mitral regurgitation    Multiple open wounds of lower extremity 06/07/2023   Murmur    Obesity (BMI 30.0-34.9) 07/29/2022   OSA (obstructive sleep apnea)    Palpitations    Spondylosis 07/06/2023    Assessment: Patient Reported Symptoms:  Cognitive Cognitive Status: Normal speech and language skills, Alert and oriented to person, place, and time      Neurological Neurological Review of Symptoms: Not assessed    HEENT HEENT Symptoms Reported: Not assessed      Cardiovascular Cardiovascular Symptoms Reported: Not assessed    Respiratory Respiratory Symptoms Reported: Not assesed    Endocrine Endocrine Symptoms Reported: Not assessed    Gastrointestinal Gastrointestinal Symptoms Reported: Not assessed      Genitourinary Genitourinary Symptoms Reported: Not assessed    Integumentary Integumentary Symptoms Reported: Not assessed    Musculoskeletal Musculoskelatal Symptoms Reviewed: Not assessed        Psychosocial Psychosocial  Symptoms Reported: Not assessed          11/30/2023    PHQ2-9 Depression Screening   Little interest or pleasure in doing things    Feeling down, depressed, or hopeless    PHQ-2 - Total Score    Trouble falling or staying asleep, or sleeping too much    Feeling tired or having little energy    Poor appetite or overeating     Feeling bad about yourself - or that you are a failure or have let yourself or your family down    Trouble concentrating on things, such as reading the newspaper or watching television    Moving or speaking so slowly that other people could have noticed.  Or the opposite - being so fidgety or restless that you have been moving around a lot more than usual    Thoughts that you would be better off dead, or hurting yourself in some way    PHQ2-9 Total Score    If you checked off any problems, how difficult have these problems made it for you to do your work, take care of things at home, or get along with other people    Depression Interventions/Treatment      There were no vitals filed for this visit.  Medications Reviewed Today     Reviewed by Veva Bolt, LCSW (Social Worker) on 11/30/23 at 1112  Med List Status: <None>   Medication Order Taking? Sig Documenting Provider Last Dose Status Informant  albuterol  (VENTOLIN  HFA) 108 (90 Base) MCG/ACT inhaler [id number]  Inhale 2 puffs  into the lungs every 4 (four) hours as needed for shortness of breath or wheezing. [provider]  Active Self, Pharmacy Records  ARIPiprazole  (ABILIFY ) 15 MG tablet [id number]  Take 15 mg by mouth daily. [provider]  Active Self, Pharmacy Records  aspirin  EC 81 MG tablet [id number]  Take 1 tablet (81 mg total) by mouth daily. Swallow whole. Revankar, Jennifer SAUNDERS, MD  Active Self, Pharmacy Records  atorvastatin  (LIPITOR) 80 MG tablet [id number]  Take 1 tablet (80 mg total) by mouth at bedtime. Henry Manuelita NOVAK, NP  Active   Calcium  Carb-Cholecalciferol (OYSTER SHELL  CALCIUM  250+D) 250-3.125 MG-MCG TABS [id number]  Take 1 tablet by mouth daily. [provider]  Active Self, Pharmacy Records  Cholecalciferol (VITAMIN D-3) 125 MCG (5000 UT) TABS [id number]  Take 5,000 Units by mouth daily. [provider]  Active Self, Pharmacy Records  citalopram  (CELEXA ) 40 MG tablet [id number]  Take 40 mg by mouth daily. [provider]  Active Self, Pharmacy Records  clopidogrel  (PLAVIX ) 75 MG tablet [id number]  Take 1 tablet (75 mg total) by mouth daily with breakfast. Henry Manuelita B, NP  Active   gabapentin  (NEURONTIN ) 400 MG capsule [id number]  Take 400 mg by mouth 3 (three) times daily. [provider]  Active Self, Pharmacy Records  HYDROcodone -acetaminophen  [facility]) 10-325 MG tablet [id number]  Take 1 tablet by mouth 3 (three) times daily as needed for moderate pain (pain score 4-6) or severe pain (pain score 7-10). [provider]  Active Self, Pharmacy Records  isosorbide  mononitrate (IMDUR ) 30 MG 24 hr tablet [id number]  Take 0.5 tablets (15 mg total) by mouth daily. Revankar, Jennifer SAUNDERS, MD  Active Self, Pharmacy Records  lidocaine  (LIDODERM ) 5 % [id number]  Place 1 patch onto the skin daily as needed (for pain). [provider]  Active Self, Pharmacy Records  losartan  (COZAAR ) 25 MG tablet [id number]  Take 0.5 tablets (12.5 mg total) by mouth at bedtime. Carlin Delon BROCKS, NP  Active   methocarbamol  (ROBAXIN ) 500 MG tablet [id number]  Take 500 mg by mouth 2 (two) times daily. [provider]  Active Self, Pharmacy Records           Med Note (SATTER[place], TEENA BRAVO   Wed Sep 29, 2023 10:12 PM) Patient verified she takes every day   metroNIDAZOLE  (FLAGYL ) 500 MG tablet [id number]  Take 1 tablet (500 mg total) by mouth 2 (two) times daily.   Active   Multiple Vitamin (MULTIVITAMIN ADULT PO) [id number]  Take 1 tablet by mouth daily. [provider]  Active Self, Pharmacy Records  naloxone [facility]) nasal spray 4  mg/0.1 mL [id number]  Place 1 spray into the nose daily as needed (overdose). [provider]  Active Self, Pharmacy Records  nitroGLYCERIN  (NITROSTAT ) 0.4 MG SL tablet [id number]  Place 1 tablet (0.4 mg total) under the tongue every 5 (five) minutes as needed for chest pain. Carlin Delon BROCKS, NP  Active   PREMARIN vaginal cream [id number]  Place 0.5 g vaginally 2 (two) times a week. Take on Sundays and Thursdays per patient [provider]  Active Self, Pharmacy Records  QUEtiapine  (SEROQUEL ) 300 MG tablet [id number]  Take 300 mg by mouth at bedtime. [provider]  Active Self, Pharmacy Records  Discontinued 12/28/22 0900 (Dose change)   torsemide  (DEMADEX ) 20 MG tablet [id number]  Take 1 tablet (20 mg total) by mouth 2 (two) times daily. Henry Manuelita NOVAK, NP  Active   traZODone  (DESYREL ) 50 MG tablet [id number]  Take 100 mg by mouth at bedtime. [provider]  Active Self, Pharmacy Records  [facility] ELLIPTA 200-62.5-25 MCG/ACT AEPB [id number]  Inhale 1 puff into the lungs daily. [provider]  Active Self, Pharmacy Records            Recommendation:   PCP Follow-up Continue Current Plan of Care  Follow Up Plan:   Telephone follow up appointment date/time:  02/26/23 at 11am.  Cena Ligas, LCSW Clinical Social Worker [facility] Population [facility]

## 2023-12-02 DIAGNOSIS — Z48812 Encounter for surgical aftercare following surgery on the circulatory system: Secondary | ICD-10-CM | POA: Diagnosis not present

## 2023-12-02 DIAGNOSIS — I700 Atherosclerosis of aorta: Secondary | ICD-10-CM | POA: Diagnosis not present

## 2023-12-02 DIAGNOSIS — J9611 Chronic respiratory failure with hypoxia: Secondary | ICD-10-CM | POA: Diagnosis not present

## 2023-12-02 DIAGNOSIS — I5022 Chronic systolic (congestive) heart failure: Secondary | ICD-10-CM | POA: Diagnosis not present

## 2023-12-02 DIAGNOSIS — I25119 Atherosclerotic heart disease of native coronary artery with unspecified angina pectoris: Secondary | ICD-10-CM | POA: Diagnosis not present

## 2023-12-02 DIAGNOSIS — J439 Emphysema, unspecified: Secondary | ICD-10-CM | POA: Diagnosis not present

## 2023-12-02 DIAGNOSIS — F319 Bipolar disorder, unspecified: Secondary | ICD-10-CM | POA: Diagnosis not present

## 2023-12-02 DIAGNOSIS — I5030 Unspecified diastolic (congestive) heart failure: Secondary | ICD-10-CM | POA: Diagnosis not present

## 2023-12-04 DIAGNOSIS — J449 Chronic obstructive pulmonary disease, unspecified: Secondary | ICD-10-CM | POA: Diagnosis not present

## 2023-12-04 DIAGNOSIS — E785 Hyperlipidemia, unspecified: Secondary | ICD-10-CM | POA: Diagnosis not present

## 2023-12-07 DIAGNOSIS — I700 Atherosclerosis of aorta: Secondary | ICD-10-CM | POA: Diagnosis not present

## 2023-12-07 DIAGNOSIS — I110 Hypertensive heart disease with heart failure: Secondary | ICD-10-CM | POA: Diagnosis not present

## 2023-12-07 DIAGNOSIS — I5022 Chronic systolic (congestive) heart failure: Secondary | ICD-10-CM | POA: Diagnosis not present

## 2023-12-07 DIAGNOSIS — J9611 Chronic respiratory failure with hypoxia: Secondary | ICD-10-CM | POA: Diagnosis not present

## 2023-12-07 DIAGNOSIS — Z48812 Encounter for surgical aftercare following surgery on the circulatory system: Secondary | ICD-10-CM | POA: Diagnosis not present

## 2023-12-07 DIAGNOSIS — F319 Bipolar disorder, unspecified: Secondary | ICD-10-CM | POA: Diagnosis not present

## 2023-12-07 DIAGNOSIS — I5030 Unspecified diastolic (congestive) heart failure: Secondary | ICD-10-CM | POA: Diagnosis not present

## 2023-12-07 DIAGNOSIS — J439 Emphysema, unspecified: Secondary | ICD-10-CM | POA: Diagnosis not present

## 2023-12-07 DIAGNOSIS — I25119 Atherosclerotic heart disease of native coronary artery with unspecified angina pectoris: Secondary | ICD-10-CM | POA: Diagnosis not present

## 2023-12-08 ENCOUNTER — Encounter: Payer: Self-pay | Admitting: Cardiology

## 2023-12-08 ENCOUNTER — Ambulatory Visit: Attending: Cardiology | Admitting: Cardiology

## 2023-12-08 VITALS — BP 104/60 | HR 68 | Ht 60.0 in | Wt 145.2 lb

## 2023-12-08 DIAGNOSIS — E782 Mixed hyperlipidemia: Secondary | ICD-10-CM | POA: Diagnosis not present

## 2023-12-08 DIAGNOSIS — I10 Essential (primary) hypertension: Secondary | ICD-10-CM

## 2023-12-08 DIAGNOSIS — I2510 Atherosclerotic heart disease of native coronary artery without angina pectoris: Secondary | ICD-10-CM

## 2023-12-08 DIAGNOSIS — I340 Nonrheumatic mitral (valve) insufficiency: Secondary | ICD-10-CM | POA: Diagnosis not present

## 2023-12-08 NOTE — Patient Instructions (Signed)
 Medication Instructions:  Your physician has recommended you make the following change in your medication:   Stop Losartan   *If you need a refill on your cardiac medications before your next appointment, please call your pharmacy*   Lab Work: None ordered If you have labs (blood work) drawn today and your tests are completely normal, you will receive your results only by: MyChart Message (if you have MyChart) OR A paper copy in the mail If you have any lab test that is abnormal or we need to change your treatment, we will call you to review the results.   Testing/Procedures: None ordered   Follow-Up: At [facility], you and your health needs are our priority.  As part of our continuing mission to provide you with exceptional heart care, we have created designated Provider Care Teams.  These Care Teams include your primary Cardiologist (physician) and Advanced Practice Providers (APPs -  Physician Assistants and Nurse Practitioners) who all work together to provide you with the care you need, when you need it.  We recommend signing up for the patient portal called MyChart.  Sign up information is provided on this After Visit Summary.  MyChart is used to connect with patients for Virtual Visits (Telemedicine).  Patients are able to view lab/test results, encounter notes, upcoming appointments, etc.  Non-urgent messages can be sent to your provider as well.   To learn more about what you can do with MyChart, go to [email or web address].    Your next appointment:   9 month(s)  The format for your next appointment:   In Person  Provider:   Jennifer Crape, MD    Other Instructions none  Important Information About Sugar

## 2023-12-08 NOTE — Progress Notes (Signed)
 Cardiology Office Note:    Date:  12/08/2023   ID:  Tracy Oconnor, DOB [birth year], MRN [id number]  PCP:  Trinidad Hun, MD  Cardiologist:  Jennifer [name], MD   Referring MD: Trinidad Hun, MD    ASSESSMENT:    1. Coronary artery disease involving native coronary artery of native heart without angina pectoris   2. Primary hypertension   3. Mitral valve insufficiency, unspecified etiology   4. Mixed hyperlipidemia    PLAN:    In order of problems listed above:  Coronary artery disease: Secondary prevention stressed with the patient.  Importance of compliance with diet medication stressed and patient verbalized standing.  He was advised to ambulate to the best of her ability. Hypertension: Blood pressure stable and diet was emphasized.  I am worried about low blood pressure and occasional symptoms of postural hypotension so I will stop losartan .  She will keep a track of her blood pressures.  They are in very similar at home at this point.  She Mixed dyslipidemia: On lipid-lowering medications followed by primary care.  Goal LDL must be less than 60.  She is Weight reduction stressed and she promises to do better. Patient will be seen in follow-up appointment in 6 months or earlier if the patient has any concerns.    Medication Adjustments/Labs and Tests Ordered: Current medicines are reviewed at length with the patient today.  Concerns regarding medicines are outlined above.  No orders of the defined types were placed in this encounter.  No orders of the defined types were placed in this encounter.    No chief complaint on file.    History of Present Illness:    Tracy Oconnor is a 63 y.o. female.  Patient has past medical history of aortic atherosclerosis, coronary artery disease, essential hypertension, mixed dyslipidemia.  She ambulates with a walker.  Postcoronary stenting she is doing fine.  Her blood pressure is borderline.  She occasionally complains of dizziness  when she moves posture.  This is suggestive of mild postural hypotension.  At the time of my evaluation, the patient is alert awake oriented and in no distress.  Past Medical History:  Diagnosis Date   Aortic atherosclerosis 07/29/2022   Atherosclerosis    Bilateral carpal tunnel syndrome 01/30/2021   Bipolar disorder (HCC)    Cervical radiculopathy 01/30/2021   Chronic pain syndrome 07/06/2023   COPD (chronic obstructive pulmonary disease) (HCC)    Coronary artery disease 07/29/2022   Diastolic dysfunction 06/07/2023   DOE (dyspnea on exertion) 07/29/2022   Edema    Elevated lipoprotein A level    2024   Enteritis 12/17/2020   Fatigue    Hyperlipidemia    Hypertension    Hyponatremia 06/07/2023   Intermittent chest pain    Long term (current) use of opiate analgesic 07/06/2023   Mitral regurgitation    Multiple open wounds of lower extremity 06/07/2023   Murmur    Obesity (BMI 30.0-34.9) 07/29/2022   OSA (obstructive sleep apnea)    Palpitations    Spondylosis 07/06/2023   Unstable angina (HCC) 09/29/2023    Past Surgical History:  Procedure Laterality Date   CHOLECYSTECTOMY     CORONARY PRESSURE/FFR STUDY N/A 08/13/2022   Procedure: CORONARY PRESSURE/FFR STUDY;  Surgeon: Mady Bruckner, MD;  Location: [facility] INVASIVE CV LAB;  Service: Cardiovascular;  Laterality: N/A;   CORONARY STENT INTERVENTION N/A 09/29/2023   Procedure: CORONARY STENT INTERVENTION;  Surgeon: Verlin Bruckner BIRCH, MD;  Location: [facility] INVASIVE CV  LAB;  Service: Cardiovascular;  Laterality: N/A;   LEFT HEART CATH AND CORONARY ANGIOGRAPHY N/A 08/13/2022   Procedure: LEFT HEART CATH AND CORONARY ANGIOGRAPHY;  Surgeon: Mady Bruckner, MD;  Location: [facility] INVASIVE CV LAB;  Service: Cardiovascular;  Laterality: N/A;   LEFT HEART CATH AND CORONARY ANGIOGRAPHY N/A 09/29/2023   Procedure: LEFT HEART CATH AND CORONARY ANGIOGRAPHY;  Surgeon: Verlin Bruckner BIRCH, MD;  Location: [facility] INVASIVE CV LAB;  Service:  Cardiovascular;  Laterality: N/A;   TONSILLECTOMY     TUBAL LIGATION      Current Medications: Current Meds  Medication Sig   albuterol  (VENTOLIN  HFA) 108 (90 Base) MCG/ACT inhaler Inhale 2 puffs into the lungs every 4 (four) hours as needed for shortness of breath or wheezing.   ARIPiprazole  (ABILIFY ) 15 MG tablet Take 15 mg by mouth daily.   aspirin  EC 81 MG tablet Take 1 tablet (81 mg total) by mouth daily. Swallow whole.   atorvastatin  (LIPITOR) 80 MG tablet Take 1 tablet (80 mg total) by mouth at bedtime.   Calcium  Carb-Cholecalciferol (OYSTER SHELL CALCIUM  250+D) 250-3.125 MG-MCG TABS Take 1 tablet by mouth daily.   Cholecalciferol (VITAMIN D-3) 125 MCG (5000 UT) TABS Take 5,000 Units by mouth daily.   citalopram  (CELEXA ) 40 MG tablet Take 40 mg by mouth daily.   clopidogrel  (PLAVIX ) 75 MG tablet Take 1 tablet (75 mg total) by mouth daily with breakfast.   gabapentin  (NEURONTIN ) 400 MG capsule Take 400 mg by mouth 3 (three) times daily.   HYDROcodone -acetaminophen  (NORCO) 10-325 MG tablet Take 1 tablet by mouth 3 (three) times daily as needed for moderate pain (pain score 4-6) or severe pain (pain score 7-10).   isosorbide  mononitrate (IMDUR ) 30 MG 24 hr tablet Take 0.5 tablets (15 mg total) by mouth daily.   lidocaine  (LIDODERM ) 5 % Place 1 patch onto the skin daily as needed (for pain).   losartan  (COZAAR ) 25 MG tablet Take 0.5 tablets (12.5 mg total) by mouth at bedtime.   methocarbamol  (ROBAXIN ) 500 MG tablet Take 500 mg by mouth 2 (two) times daily.   metroNIDAZOLE  (FLAGYL ) 500 MG tablet Take 1 tablet (500 mg total) by mouth 2 (two) times daily.   Multiple Vitamin (MULTIVITAMIN ADULT PO) Take 1 tablet by mouth daily.   naloxone (NARCAN) nasal spray 4 mg/0.1 mL Place 1 spray into the nose daily as needed (overdose).   nitroGLYCERIN  (NITROSTAT ) 0.4 MG SL tablet Place 1 tablet (0.4 mg total) under the tongue every 5 (five) minutes as needed for chest pain.   PREMARIN vaginal cream  Place 0.5 g vaginally 2 (two) times a week. Take on Sundays and Thursdays per patient   QUEtiapine  (SEROQUEL ) 300 MG tablet Take 300 mg by mouth at bedtime.   torsemide  (DEMADEX ) 20 MG tablet Take 1 tablet (20 mg total) by mouth 2 (two) times daily.   traZODone  (DESYREL ) 50 MG tablet Take 100 mg by mouth at bedtime.   TRELEGY ELLIPTA 200-62.5-25 MCG/ACT AEPB Inhale 1 puff into the lungs daily.     Allergies:   Penicillins and Jardiance  [empagliflozin ]   Social History   Socioeconomic History   Marital status: Single    Spouse name: Not on file   Number of children: Not on file   Years of education: Not on file   Highest education level: Not on file  Occupational History   Not on file  Tobacco Use   Smoking status: Former    Types: Cigarettes   Smokeless tobacco: Never  Substance and Sexual Activity   Alcohol  use: Not on file   Drug use: Not on file   Sexual activity: Not on file  Other Topics Concern   Not on file  Social History Narrative   Not on file   Social Drivers of Health   Financial Resource Strain: Not on file  Food Insecurity: Food Insecurity Present (11/09/2023)   Hunger Vital Sign    Worried About Running Out of Food in the Last Year: Often true    Ran Out of Food in the Last Year: Often true  Transportation Needs: Unmet Transportation Needs (11/09/2023)   PRAPARE - Administrator, Civil Service (Medical): No    Lack of Transportation (Non-Medical): Yes  Physical Activity: Not on file  Stress: Not on file  Social Connections: Moderately Isolated (06/08/2023)   Social Connection and Isolation Panel    Frequency of Communication with Friends and Family: More than three times a week    Frequency of Social Gatherings with Friends and Family: Three times a week    Attends Religious Services: Never    Active Member of Clubs or Organizations: No    Attends Engineer, Structural: More than 4 times per year    Marital Status: Never married      Family History: The patient's family history includes Breast cancer in her maternal aunt; Cancer in her father; Heart disease in her mother.  ROS:   Please see the history of present illness.    All other systems reviewed and are negative.  EKGs/Labs/Other Studies Reviewed:    The following studies were reviewed today: .SABRA   I discussed my findings with the patient at length   Recent Labs: 02/18/2023: NT-Pro BNP 548 06/07/2023: TSH 3.933 07/06/2023: ALT 19 10/08/2023: BUN 8; Creatinine, Ser 0.95; Hemoglobin 9.9; Platelets 543; Potassium 4.2; Sodium 137  Recent Lipid Panel    Component Value Date/Time   LDLDIRECT 59 07/06/2023 1025    Physical Exam:    VS:  BP 104/60   Pulse 68   Ht 5' (1.524 m)   Wt 145 lb 3.2 oz (65.9 kg)   SpO2 94%   BMI 28.36 kg/m     Wt Readings from Last 3 Encounters:  12/08/23 145 lb 3.2 oz (65.9 kg)  10/08/23 146 lb (66.2 kg)  10/07/23 153 lb (69.4 kg)     GEN: Patient is in no acute distress HEENT: Normal NECK: No JVD; No carotid bruits LYMPHATICS: No lymphadenopathy CARDIAC: Hear sounds regular, 2/6 systolic murmur at the apex. RESPIRATORY:  Clear to auscultation without rales, wheezing or rhonchi  ABDOMEN: Soft, non-tender, non-distended MUSCULOSKELETAL:  No edema; No deformity  SKIN: Warm and dry NEUROLOGIC:  Alert and oriented x 3 PSYCHIATRIC:  Normal affect   Signed, Jennifer [name], MD  12/08/2023 11:39 AM    [facility]

## 2023-12-09 DIAGNOSIS — J439 Emphysema, unspecified: Secondary | ICD-10-CM | POA: Diagnosis not present

## 2023-12-09 DIAGNOSIS — Z79620 Long term (current) use of immunosuppressive biologic: Secondary | ICD-10-CM | POA: Diagnosis not present

## 2023-12-09 DIAGNOSIS — H269 Unspecified cataract: Secondary | ICD-10-CM | POA: Diagnosis not present

## 2023-12-09 DIAGNOSIS — I739 Peripheral vascular disease, unspecified: Secondary | ICD-10-CM | POA: Diagnosis not present

## 2023-12-09 DIAGNOSIS — Z823 Family history of stroke: Secondary | ICD-10-CM | POA: Diagnosis not present

## 2023-12-09 DIAGNOSIS — F319 Bipolar disorder, unspecified: Secondary | ICD-10-CM | POA: Diagnosis not present

## 2023-12-09 DIAGNOSIS — Z9981 Dependence on supplemental oxygen: Secondary | ICD-10-CM | POA: Diagnosis not present

## 2023-12-09 DIAGNOSIS — M810 Age-related osteoporosis without current pathological fracture: Secondary | ICD-10-CM | POA: Diagnosis not present

## 2023-12-09 DIAGNOSIS — Z9181 History of falling: Secondary | ICD-10-CM | POA: Diagnosis not present

## 2023-12-09 DIAGNOSIS — I130 Hypertensive heart and chronic kidney disease with heart failure and stage 1 through stage 4 chronic kidney disease, or unspecified chronic kidney disease: Secondary | ICD-10-CM | POA: Diagnosis not present

## 2023-12-09 DIAGNOSIS — J309 Allergic rhinitis, unspecified: Secondary | ICD-10-CM | POA: Diagnosis not present

## 2023-12-09 DIAGNOSIS — N952 Postmenopausal atrophic vaginitis: Secondary | ICD-10-CM | POA: Diagnosis not present

## 2023-12-09 DIAGNOSIS — I369 Nonrheumatic tricuspid valve disorder, unspecified: Secondary | ICD-10-CM | POA: Diagnosis not present

## 2023-12-09 DIAGNOSIS — I252 Old myocardial infarction: Secondary | ICD-10-CM | POA: Diagnosis not present

## 2023-12-09 DIAGNOSIS — H9193 Unspecified hearing loss, bilateral: Secondary | ICD-10-CM | POA: Diagnosis not present

## 2023-12-09 DIAGNOSIS — Z79899 Other long term (current) drug therapy: Secondary | ICD-10-CM | POA: Diagnosis not present

## 2023-12-09 DIAGNOSIS — N3941 Urge incontinence: Secondary | ICD-10-CM | POA: Diagnosis not present

## 2023-12-09 DIAGNOSIS — Z888 Allergy status to other drugs, medicaments and biological substances status: Secondary | ICD-10-CM | POA: Diagnosis not present

## 2023-12-09 DIAGNOSIS — I25119 Atherosclerotic heart disease of native coronary artery with unspecified angina pectoris: Secondary | ICD-10-CM | POA: Diagnosis not present

## 2023-12-09 DIAGNOSIS — Z59869 Financial insecurity, unspecified: Secondary | ICD-10-CM | POA: Diagnosis not present

## 2023-12-09 DIAGNOSIS — I349 Nonrheumatic mitral valve disorder, unspecified: Secondary | ICD-10-CM | POA: Diagnosis not present

## 2023-12-09 DIAGNOSIS — M62838 Other muscle spasm: Secondary | ICD-10-CM | POA: Diagnosis not present

## 2023-12-09 DIAGNOSIS — N1831 Chronic kidney disease, stage 3a: Secondary | ICD-10-CM | POA: Diagnosis not present

## 2023-12-09 DIAGNOSIS — I700 Atherosclerosis of aorta: Secondary | ICD-10-CM | POA: Diagnosis not present

## 2023-12-09 DIAGNOSIS — E785 Hyperlipidemia, unspecified: Secondary | ICD-10-CM | POA: Diagnosis not present

## 2023-12-09 DIAGNOSIS — R0902 Hypoxemia: Secondary | ICD-10-CM | POA: Diagnosis not present

## 2023-12-09 DIAGNOSIS — I509 Heart failure, unspecified: Secondary | ICD-10-CM | POA: Diagnosis not present

## 2023-12-09 DIAGNOSIS — F1120 Opioid dependence, uncomplicated: Secondary | ICD-10-CM | POA: Diagnosis not present

## 2023-12-09 DIAGNOSIS — G629 Polyneuropathy, unspecified: Secondary | ICD-10-CM | POA: Diagnosis not present

## 2023-12-10 DIAGNOSIS — I25119 Atherosclerotic heart disease of native coronary artery with unspecified angina pectoris: Secondary | ICD-10-CM | POA: Diagnosis not present

## 2023-12-10 DIAGNOSIS — I5030 Unspecified diastolic (congestive) heart failure: Secondary | ICD-10-CM | POA: Diagnosis not present

## 2023-12-10 DIAGNOSIS — Z48812 Encounter for surgical aftercare following surgery on the circulatory system: Secondary | ICD-10-CM | POA: Diagnosis not present

## 2023-12-10 DIAGNOSIS — I110 Hypertensive heart disease with heart failure: Secondary | ICD-10-CM | POA: Diagnosis not present

## 2023-12-10 DIAGNOSIS — J439 Emphysema, unspecified: Secondary | ICD-10-CM | POA: Diagnosis not present

## 2023-12-10 DIAGNOSIS — I700 Atherosclerosis of aorta: Secondary | ICD-10-CM | POA: Diagnosis not present

## 2023-12-10 DIAGNOSIS — J9611 Chronic respiratory failure with hypoxia: Secondary | ICD-10-CM | POA: Diagnosis not present

## 2023-12-10 DIAGNOSIS — F319 Bipolar disorder, unspecified: Secondary | ICD-10-CM | POA: Diagnosis not present

## 2023-12-15 DIAGNOSIS — M810 Age-related osteoporosis without current pathological fracture: Secondary | ICD-10-CM | POA: Diagnosis not present

## 2023-12-16 DIAGNOSIS — Z79891 Long term (current) use of opiate analgesic: Secondary | ICD-10-CM | POA: Diagnosis not present

## 2023-12-16 DIAGNOSIS — M47816 Spondylosis without myelopathy or radiculopathy, lumbar region: Secondary | ICD-10-CM | POA: Diagnosis not present

## 2023-12-16 DIAGNOSIS — M479 Spondylosis, unspecified: Secondary | ICD-10-CM | POA: Diagnosis not present

## 2023-12-16 DIAGNOSIS — R269 Unspecified abnormalities of gait and mobility: Secondary | ICD-10-CM | POA: Diagnosis not present

## 2023-12-22 ENCOUNTER — Other Ambulatory Visit: Payer: Self-pay | Admitting: Cardiology

## 2023-12-23 ENCOUNTER — Other Ambulatory Visit: Payer: Self-pay | Admitting: Cardiology

## 2023-12-24 MED ORDER — ATORVASTATIN CALCIUM 80 MG PO TABS
80.0000 mg | ORAL_TABLET | Freq: Every day | ORAL | 3 refills | Status: AC
Start: 2023-12-24 — End: ?

## 2023-12-26 ENCOUNTER — Other Ambulatory Visit: Payer: Self-pay | Admitting: Cardiology

## 2023-12-27 ENCOUNTER — Other Ambulatory Visit: Payer: Self-pay | Admitting: Licensed Clinical Social Worker

## 2023-12-27 NOTE — Patient Outreach (Signed)
 Complex Care Management   Visit Note  12/27/2023  Name:  Tracy Oconnor MRN: [id number] DOB: Jul 08, 1960  Situation: Referral received for Complex Care Management related to SDOH Barriers:  Transportation and Food insecurity I obtained verbal consent from Patient.  Visit completed with Patient  on the phone.  Background:   Past Medical History:  Diagnosis Date   Aortic atherosclerosis 07/29/2022   Atherosclerosis    Bilateral carpal tunnel syndrome 01/30/2021   Bipolar disorder (HCC)    Cervical radiculopathy 01/30/2021   Chronic pain syndrome 07/06/2023   COPD (chronic obstructive pulmonary disease) (HCC)    Coronary artery disease 07/29/2022   Diastolic dysfunction 06/07/2023   DOE (dyspnea on exertion) 07/29/2022   Edema    Elevated lipoprotein A level    2024   Enteritis 12/17/2020   Fatigue    Hyperlipidemia    Hypertension    Hyponatremia 06/07/2023   Intermittent chest pain    Long term (current) use of opiate analgesic 07/06/2023   Mitral regurgitation    Multiple open wounds of lower extremity 06/07/2023   Murmur    Obesity (BMI 30.0-34.9) 07/29/2022   OSA (obstructive sleep apnea)    Palpitations    Spondylosis 07/06/2023   Unstable angina (HCC) 09/29/2023    Assessment: Patient reports she is doing well. Patient states she is still attending cardiac rehab. The reports she has been using [facility] to get to appointments and she has access to food. Patietn reports she has no other social work needs at this time. LCSW will close out referral.    Patient Reported Symptoms:  Cognitive Cognitive Status: Alert and oriented to person, place, and time, Normal speech and language skills      Neurological Neurological Review of Symptoms: Not assessed    HEENT HEENT Symptoms Reported: Not assessed      Cardiovascular Cardiovascular Symptoms Reported: Not assessed    Respiratory Respiratory Symptoms Reported: Not assesed    Endocrine Endocrine Symptoms Reported:  Not assessed    Gastrointestinal Gastrointestinal Symptoms Reported: Not assessed      Genitourinary Genitourinary Symptoms Reported: Not assessed    Integumentary Integumentary Symptoms Reported: Not assessed    Musculoskeletal Musculoskelatal Symptoms Reviewed: Difficulty walking, Limited mobility Additional Musculoskeletal Details: Pt is in cardiac rehab Musculoskeletal Management Strategies: Exercise, Medication therapy, Medical device      Psychosocial Psychosocial Symptoms Reported: No symptoms reported          12/27/2023    PHQ2-9 Depression Screening   Little interest or pleasure in doing things    Feeling down, depressed, or hopeless    PHQ-2 - Total Score    Trouble falling or staying asleep, or sleeping too much    Feeling tired or having little energy    Poor appetite or overeating     Feeling bad about yourself - or that you are a failure or have let yourself or your family down    Trouble concentrating on things, such as reading the newspaper or watching television    Moving or speaking so slowly that other people could have noticed.  Or the opposite - being so fidgety or restless that you have been moving around a lot more than usual    Thoughts that you would be better off dead, or hurting yourself in some way    PHQ2-9 Total Score    If you checked off any problems, how difficult have these problems made it for you to do your work, take care of things  at home, or get along with other people    Depression Interventions/Treatment      There were no vitals filed for this visit.    Medications Reviewed Today     Reviewed by Veva Bolt, LCSW (Social Worker) on 12/27/23 at 1106  Med List Status: <None>   Medication Order Taking? Sig Documenting Provider Last Dose Status Informant  albuterol  (VENTOLIN  HFA) 108 (90 Base) MCG/ACT inhaler [id number]  Inhale 2 puffs into the lungs every 4 (four) hours as needed for shortness of breath or wheezing. [provider]  Active Self, Pharmacy Records  ARIPiprazole  (ABILIFY ) 15 MG tablet [id number]  Take 15 mg by mouth daily. [provider]  Active Self, Pharmacy Records  aspirin  EC 81 MG tablet [id number]  Take 1 tablet (81 mg total) by mouth daily. Swallow whole. Revankar, Jennifer SAUNDERS, MD  Active Self, Pharmacy Records  atorvastatin  (LIPITOR) 80 MG tablet [id number]  Take 1 tablet (80 mg total) by mouth at bedtime. Revankar, Jennifer SAUNDERS, MD  Active   Calcium  Carb-Cholecalciferol (OYSTER SHELL CALCIUM  250+D) 250-3.125 MG-MCG TABS [id number]  Take 1 tablet by mouth daily. [provider]  Active Self, Pharmacy Records  Cholecalciferol (VITAMIN D-3) 125 MCG (5000 UT) TABS [id number]  Take 5,000 Units by mouth daily. [provider]  Active Self, Pharmacy Records  citalopram  (CELEXA ) 40 MG tablet [id number]  Take 40 mg by mouth daily. [provider]  Active Self, Pharmacy Records  clopidogrel  (PLAVIX ) 75 MG tablet [id number]  Take 1 tablet (75 mg total) by mouth daily with breakfast. Henry Shaver B, NP  Active   gabapentin  (NEURONTIN ) 400 MG capsule [id number]  Take 400 mg by mouth 3 (three) times daily. [provider]  Active Self, Pharmacy Records  HYDROcodone -acetaminophen  [facility]) 10-325 MG tablet [id number]  Take 1 tablet by mouth 3 (three) times daily as needed for moderate pain (pain score 4-6) or severe pain (pain score 7-10). [provider]  Active Self, Pharmacy Records  isosorbide  mononitrate (IMDUR ) 30 MG 24 hr tablet [id number]  Take 0.5 tablets (15 mg total) by mouth daily. Revankar, Jennifer SAUNDERS, MD  Active Self, Pharmacy Records  lidocaine  (LIDODERM ) 5 % [id number]  Place 1 patch onto the skin daily as needed (for pain). [provider]  Active Self, Pharmacy Records  methocarbamol  (ROBAXIN ) 500 MG tablet [id number]  Take 500 mg by mouth 2 (two) times daily. [provider]  Active Self, Pharmacy Records           Med Note (GARNER,  TIFFANY L   Wed Dec 08, 2023 11:26 AM)    metroNIDAZOLE  (FLAGYL ) 500 MG tablet [id number]  Take 1 tablet (500 mg total) by mouth 2 (two) times daily.   Active   Multiple Vitamin (MULTIVITAMIN ADULT PO) [id number]  Take 1 tablet by mouth daily. [provider]  Active Self, Pharmacy Records  naloxone [facility]) nasal spray 4 mg/0.1 mL [id number]  Place 1 spray into the nose daily as needed (overdose). [provider]  Active Self, Pharmacy Records  nitroGLYCERIN  (NITROSTAT ) 0.4 MG SL tablet [id number]  Place 1 tablet (0.4 mg total) under the tongue every 5 (five) minutes as needed for chest pain. Carlin Delon BROCKS, NP  Active   PREMARIN vaginal cream [id number]  Place 0.5 g vaginally 2 (two) times a week. Take on Sundays and Thursdays per patient [provider]  Active Self, Pharmacy Records  QUEtiapine  (SEROQUEL ) 300 MG tablet [id number]  Take 300 mg by mouth at bedtime. [provider]  Active Self, Pharmacy Records  Discontinued 12/28/22 0900 (Dose change)   torsemide  (DEMADEX ) 20 MG tablet [id number]  Take 1 tablet (20 mg total) by mouth 2 (two) times daily. Henry Manuelita NOVAK, NP  Active   traZODone  (DESYREL ) 50 MG tablet [id number]  Take 100 mg by mouth at bedtime. [provider]  Active Self, Pharmacy Records  [facility] ELLIPTA 200-62.5-25 MCG/ACT AEPB [id number]  Inhale 1 puff into the lungs daily. [provider]  Active Self, Pharmacy Records            Recommendation:   PCP Follow-up Continue Current Plan of Care  Follow Up Plan:   Closing From:  Complex Care Management  Cena Ligas, LCSW Clinical Social Worker [facility]

## 2023-12-27 NOTE — Patient Instructions (Signed)
 Visit Information  Thank you for taking time to visit with me today. Please don't hesitate to contact me if I can be of assistance to you before our next scheduled appointment.  Closing From: Complex Care Management.  Please call the care guide team at [phone] if you need to cancel, schedule, or reschedule an appointment.   Please call 911 if you are experiencing a Mental Health or Behavioral Health Crisis or need someone to talk to.  Cena Ligas, LCSW Clinical Social Worker [facility]

## 2024-01-03 DIAGNOSIS — J449 Chronic obstructive pulmonary disease, unspecified: Secondary | ICD-10-CM | POA: Diagnosis not present

## 2024-01-03 DIAGNOSIS — E785 Hyperlipidemia, unspecified: Secondary | ICD-10-CM | POA: Diagnosis not present

## 2024-01-04 DIAGNOSIS — K760 Fatty (change of) liver, not elsewhere classified: Secondary | ICD-10-CM | POA: Diagnosis not present

## 2024-01-04 DIAGNOSIS — Z122 Encounter for screening for malignant neoplasm of respiratory organs: Secondary | ICD-10-CM | POA: Diagnosis not present

## 2024-01-04 DIAGNOSIS — Z87891 Personal history of nicotine dependence: Secondary | ICD-10-CM | POA: Diagnosis not present

## 2024-01-21 ENCOUNTER — Other Ambulatory Visit: Payer: Self-pay | Admitting: Cardiology

## 2024-01-21 MED ORDER — TORSEMIDE 20 MG PO TABS: 20.0000 mg | ORAL_TABLET | Freq: Two times a day (BID) | ORAL | 3 refills | Status: AC

## 2024-03-02 DIAGNOSIS — I2510 Atherosclerotic heart disease of native coronary artery without angina pectoris: Secondary | ICD-10-CM | POA: Diagnosis not present

## 2024-03-02 DIAGNOSIS — I119 Hypertensive heart disease without heart failure: Secondary | ICD-10-CM | POA: Diagnosis not present

## 2024-03-02 DIAGNOSIS — E785 Hyperlipidemia, unspecified: Secondary | ICD-10-CM | POA: Diagnosis not present

## 2024-03-02 DIAGNOSIS — I5032 Chronic diastolic (congestive) heart failure: Secondary | ICD-10-CM | POA: Diagnosis not present

## 2024-03-03 DIAGNOSIS — R079 Chest pain, unspecified: Secondary | ICD-10-CM | POA: Diagnosis not present

## 2024-03-03 DIAGNOSIS — I2510 Atherosclerotic heart disease of native coronary artery without angina pectoris: Secondary | ICD-10-CM | POA: Diagnosis not present

## 2024-03-04 DIAGNOSIS — I2510 Atherosclerotic heart disease of native coronary artery without angina pectoris: Secondary | ICD-10-CM | POA: Diagnosis not present

## 2024-03-05 DIAGNOSIS — I2510 Atherosclerotic heart disease of native coronary artery without angina pectoris: Secondary | ICD-10-CM | POA: Diagnosis not present

## 2024-03-06 ENCOUNTER — Encounter (HOSPITAL_COMMUNITY): Payer: Self-pay | Admitting: Cardiology

## 2024-03-06 ENCOUNTER — Observation Stay (HOSPITAL_COMMUNITY)
Admission: EM | Admit: 2024-03-06 | Discharge: 2024-03-07 | Disposition: A | Source: Other Acute Inpatient Hospital | Attending: *Deleted | Admitting: *Deleted

## 2024-03-06 ENCOUNTER — Encounter (HOSPITAL_COMMUNITY): Admission: EM | Disposition: A | Payer: Self-pay | Source: Other Acute Inpatient Hospital | Attending: *Deleted

## 2024-03-06 DIAGNOSIS — J449 Chronic obstructive pulmonary disease, unspecified: Secondary | ICD-10-CM | POA: Diagnosis present

## 2024-03-06 DIAGNOSIS — I25119 Atherosclerotic heart disease of native coronary artery with unspecified angina pectoris: Secondary | ICD-10-CM | POA: Diagnosis present

## 2024-03-06 DIAGNOSIS — Z955 Presence of coronary angioplasty implant and graft: Secondary | ICD-10-CM

## 2024-03-06 DIAGNOSIS — I10 Essential (primary) hypertension: Secondary | ICD-10-CM | POA: Diagnosis present

## 2024-03-06 DIAGNOSIS — R0789 Other chest pain: Principal | ICD-10-CM | POA: Insufficient documentation

## 2024-03-06 DIAGNOSIS — I2510 Atherosclerotic heart disease of native coronary artery without angina pectoris: Secondary | ICD-10-CM | POA: Diagnosis not present

## 2024-03-06 DIAGNOSIS — R072 Precordial pain: Secondary | ICD-10-CM

## 2024-03-06 DIAGNOSIS — E785 Hyperlipidemia, unspecified: Secondary | ICD-10-CM | POA: Diagnosis present

## 2024-03-06 DIAGNOSIS — Z87891 Personal history of nicotine dependence: Secondary | ICD-10-CM | POA: Insufficient documentation

## 2024-03-06 HISTORY — DX: Precordial pain: R07.2

## 2024-03-06 MED ORDER — ATORVASTATIN CALCIUM 80 MG PO TABS
80.0000 mg | ORAL_TABLET | Freq: Every day | ORAL | Status: DC
  Administered 2024-03-06: 80 mg via ORAL
  Filled 2024-03-06: qty 1

## 2024-03-06 MED ORDER — HEPARIN SODIUM (PORCINE) 1000 UNIT/ML IJ SOLN
INTRAMUSCULAR | Status: AC
  Filled 2024-03-06: qty 10

## 2024-03-06 MED ORDER — LIDOCAINE HCL (PF) 1 % IJ SOLN
INTRAMUSCULAR | Status: AC
  Filled 2024-03-06: qty 30

## 2024-03-06 MED ORDER — TRAZODONE HCL 50 MG PO TABS
100.0000 mg | ORAL_TABLET | Freq: Every day | ORAL | Status: DC
  Administered 2024-03-06: 100 mg via ORAL
  Filled 2024-03-06: qty 2

## 2024-03-06 MED ORDER — ALBUTEROL SULFATE (2.5 MG/3ML) 0.083% IN NEBU: 2.5000 mg | INHALATION_SOLUTION | RESPIRATORY_TRACT | Status: DC | PRN

## 2024-03-06 MED ORDER — ARIPIPRAZOLE 5 MG PO TABS
15.0000 mg | ORAL_TABLET | Freq: Every day | ORAL | Status: DC
  Administered 2024-03-06 – 2024-03-07 (×2): 15 mg via ORAL
  Filled 2024-03-06 (×2): qty 3

## 2024-03-06 MED ORDER — HYDRALAZINE HCL 20 MG/ML IJ SOLN: 10.0000 mg | INTRAMUSCULAR | Status: AC | PRN

## 2024-03-06 MED ORDER — BUDESON-GLYCOPYRROL-FORMOTEROL 160-9-4.8 MCG/ACT IN AERO
2.0000 | INHALATION_SPRAY | Freq: Two times a day (BID) | RESPIRATORY_TRACT | Status: DC
  Administered 2024-03-06 – 2024-03-07 (×2): 2 via RESPIRATORY_TRACT
  Filled 2024-03-06: qty 5.9

## 2024-03-06 MED ORDER — HYDROCODONE-ACETAMINOPHEN 10-325 MG PO TABS
1.0000 | ORAL_TABLET | Freq: Three times a day (TID) | ORAL | Status: DC | PRN
  Administered 2024-03-06 – 2024-03-07 (×2): 1 via ORAL
  Filled 2024-03-06 (×2): qty 1

## 2024-03-06 MED ORDER — VERAPAMIL HCL 2.5 MG/ML IV SOLN
INTRAVENOUS | Status: AC
  Filled 2024-03-06: qty 2

## 2024-03-06 MED ORDER — VITAMIN D 25 MCG (1000 UNIT) PO TABS
5000.0000 [IU] | ORAL_TABLET | Freq: Every day | ORAL | Status: DC
  Administered 2024-03-07: 5000 [IU] via ORAL
  Filled 2024-03-06: qty 5

## 2024-03-06 MED ORDER — SODIUM CHLORIDE 0.9% FLUSH: 3.0000 mL | INTRAVENOUS | Status: DC | PRN

## 2024-03-06 MED ORDER — CITALOPRAM HYDROBROMIDE 20 MG PO TABS
40.0000 mg | ORAL_TABLET | Freq: Every day | ORAL | Status: DC
  Administered 2024-03-06 – 2024-03-07 (×2): 40 mg via ORAL
  Filled 2024-03-06 (×2): qty 2

## 2024-03-06 MED ORDER — ACETAMINOPHEN 325 MG PO TABS: 650.0000 mg | ORAL_TABLET | ORAL | Status: DC | PRN

## 2024-03-06 MED ORDER — LIDOCAINE HCL (PF) 1 % IJ SOLN
INTRAMUSCULAR | Status: DC | PRN
  Administered 2024-03-06: 5 mL via INTRADERMAL

## 2024-03-06 MED ORDER — FENTANYL CITRATE (PF) 100 MCG/2ML IJ SOLN
INTRAMUSCULAR | Status: DC | PRN
  Administered 2024-03-06: 25 ug via INTRAVENOUS

## 2024-03-06 MED ORDER — FENTANYL CITRATE (PF) 100 MCG/2ML IJ SOLN
INTRAMUSCULAR | Status: AC
  Filled 2024-03-06: qty 2

## 2024-03-06 MED ORDER — SODIUM CHLORIDE 0.9% FLUSH
3.0000 mL | Freq: Two times a day (BID) | INTRAVENOUS | Status: DC
  Administered 2024-03-06 – 2024-03-07 (×2): 3 mL via INTRAVENOUS

## 2024-03-06 MED ORDER — SODIUM CHLORIDE 0.9 % IV SOLN: 250.0000 mL | INTRAVENOUS | Status: DC | PRN

## 2024-03-06 MED ORDER — ISOSORBIDE MONONITRATE ER 30 MG PO TB24
15.0000 mg | ORAL_TABLET | Freq: Every day | ORAL | Status: DC
  Administered 2024-03-06 – 2024-03-07 (×2): 15 mg via ORAL
  Filled 2024-03-06 (×2): qty 1

## 2024-03-06 MED ORDER — FREE WATER
500.0000 mL | Freq: Once | Status: AC
  Administered 2024-03-06: 500 mL via ORAL

## 2024-03-06 MED ORDER — HEPARIN (PORCINE) IN NACL 2000-0.9 UNIT/L-% IV SOLN
INTRAVENOUS | Status: DC | PRN
  Administered 2024-03-06: 1000 mL

## 2024-03-06 MED ORDER — ONDANSETRON HCL 4 MG/2ML IJ SOLN: 4.0000 mg | Freq: Four times a day (QID) | INTRAMUSCULAR | Status: DC | PRN

## 2024-03-06 MED ORDER — IOHEXOL 350 MG/ML SOLN
INTRAVENOUS | Status: DC | PRN
  Administered 2024-03-06: 45 mL

## 2024-03-06 MED ORDER — OYSTER SHELL CALCIUM/D3 500-5 MG-MCG PO TABS: 0.5000 | ORAL_TABLET | Freq: Every day | ORAL | Status: DC

## 2024-03-06 MED ORDER — MIDAZOLAM HCL (PF) 2 MG/2ML IJ SOLN
INTRAMUSCULAR | Status: DC | PRN
  Administered 2024-03-06: 1 mg via INTRAVENOUS

## 2024-03-06 MED ORDER — CLOPIDOGREL BISULFATE 75 MG PO TABS
75.0000 mg | ORAL_TABLET | Freq: Every day | ORAL | Status: DC
  Administered 2024-03-07: 75 mg via ORAL
  Filled 2024-03-06: qty 1

## 2024-03-06 MED ORDER — GABAPENTIN 400 MG PO CAPS
400.0000 mg | ORAL_CAPSULE | Freq: Three times a day (TID) | ORAL | Status: DC
  Administered 2024-03-06 – 2024-03-07 (×3): 400 mg via ORAL
  Filled 2024-03-06 (×3): qty 1

## 2024-03-06 MED ORDER — ASPIRIN 81 MG PO TBEC
81.0000 mg | DELAYED_RELEASE_TABLET | Freq: Every day | ORAL | Status: DC
  Administered 2024-03-06 – 2024-03-07 (×2): 81 mg via ORAL
  Filled 2024-03-06 (×2): qty 1

## 2024-03-06 MED ORDER — ADULT MULTIVITAMIN W/MINERALS CH
1.0000 | ORAL_TABLET | Freq: Every day | ORAL | Status: DC
  Administered 2024-03-07: 1 via ORAL
  Filled 2024-03-06: qty 1

## 2024-03-06 MED ORDER — MIDAZOLAM HCL 2 MG/2ML IJ SOLN
INTRAMUSCULAR | Status: AC
  Filled 2024-03-06: qty 2

## 2024-03-06 NOTE — Care Management Obs Status (Signed)
 MEDICARE OBSERVATION STATUS NOTIFICATION   Patient Details  Name: Tracy Oconnor MRN: [id number] Date of Birth: 1960-12-09   Medicare Observation Status Notification Given:      Verbally reviewed observation notice with Adrien Server  telephonically at [phone]. Will mail a copy to the patients home address.    Yaeli Hartung 03/06/2024, 4:52 PM

## 2024-03-06 NOTE — Plan of Care (Signed)

## 2024-03-07 ENCOUNTER — Telehealth: Payer: Self-pay | Admitting: Cardiology

## 2024-03-07 ENCOUNTER — Other Ambulatory Visit (HOSPITAL_COMMUNITY): Payer: Self-pay

## 2024-03-07 DIAGNOSIS — Z955 Presence of coronary angioplasty implant and graft: Secondary | ICD-10-CM

## 2024-03-07 HISTORY — DX: Presence of coronary angioplasty implant and graft: Z95.5

## 2024-03-07 MED ORDER — METOPROLOL TARTRATE 25 MG PO TABS
12.5000 mg | ORAL_TABLET | Freq: Two times a day (BID) | ORAL | 1 refills | Status: AC
  Filled 2024-03-07: qty 30, 30d supply, fill #0

## 2024-03-07 MED ORDER — METOPROLOL TARTRATE 12.5 MG HALF TABLET
12.5000 mg | ORAL_TABLET | Freq: Two times a day (BID) | ORAL | Status: DC
  Administered 2024-03-07: 12.5 mg via ORAL
  Filled 2024-03-07: qty 1

## 2024-03-07 NOTE — Care Management Obs Status (Signed)
 MEDICARE OBSERVATION STATUS NOTIFICATION   Patient Details  Name: CORRENE LALANI MRN: [id number] Date of Birth: 12/11/1960   Medicare Observation Status Notification Given:  Yes  Verbally reviewed observation notice with Adrien Server  telephonically at [phone]. Will mail a copy to the patients home address.     Davelyn Gwinn 03/07/2024, 8:28 AM

## 2024-03-07 NOTE — Progress Notes (Signed)
 "  Rounding Note   Patient Name: Tracy Oconnor Date of Encounter: 03/07/2024  [facility] Cardiologist: Jennifer [name], MD  Patient Profile   64 y.o. female former smoker with known CAD (RCA CTO and LAD PCI in August 2025), HTN, HLD and COPD who was transferred from [facility] for cardiac catheterization.  She presented there with chest discomfort reminiscent of her progressive angina symptoms in the past.  Troponin were negative.  She was evaluated with a Myoview  stress test which revealed ischemia in the basal mid and apical portion of the inferior wall.  (This is consistent with known RCA occlusion with the RCA being filled via left-to-right collaterals from the LAD). She underwent cardiac catheterization on 03/06/2024 revealing widely patent LAD stent, normal LCx with ~50 % D1 and OM 2.  Known CTO of the RCA with left-to-right collaterals from septal perforators and apical LAD which would explain inferoapical ischemia .  However no change from post PCI images from August 2025.  No new lesion.  Subjective Feels oK today -- just no Sleep --did not get her home dose of Seroquel  or trazodone . No further chest pain.  Happy to hear results of cath   Assessment & Plan  Principal Problem:   Precordial chest pain Active Problems:   Coronary artery disease involving native heart with angina pectoris   Presence of drug coated stent in LAD coronary artery   COPD (chronic obstructive pulmonary disease) (HCC)   Hyperlipidemia LDL goal <55   Primary hypertension  Principal Problem:   Precordial chest pain => referred for cardiac catheterization, no change from previous findings. Active Problems:   Coronary artery disease involving native heart with angina pectoris/   Presence of drug coated stent in LAD coronary artery Patent LAD stent with RCA occlusion, L-R collaterals filling the RCA.  This explains findings on stress tests.   Continue ASA/Plavix  DAPT Continue Imdur  and  initiate metoprolol  to tartrate 12.5 mg twice daily       Hyperlipidemia LDL goal <55: Continue atorvastatin  80 mg daily    Primary hypertension-BP stable today.  Will see if she tolerates metoprolol  tartrate 12.5 mg twice daily    COPD (chronic obstructive pulmonary disease) (HCC) -> no signs of exacerbation; former smoker.   I suspect that the abnormal stress test was related to the collateral flow to the inferior wall.  No further chest pain.  Will continue to titrate medications.  Okay to discharge today.   ==============================  Scheduled Meds:  ARIPiprazole   15 mg Oral Daily   aspirin  EC  81 mg Oral Daily   atorvastatin   80 mg Oral QHS   budesonide -glycopyrrolate -formoterol   2 puff Inhalation BID   [START ON 03/08/2024] calcium -vitamin D   0.5 tablet Oral Daily   cholecalciferol   5,000 Units Oral Daily   citalopram   40 mg Oral Daily   clopidogrel   75 mg Oral Q breakfast   gabapentin   400 mg Oral TID   isosorbide  mononitrate  15 mg Oral Daily   multivitamin with minerals  1 tablet Oral Daily   sodium chloride  flush  3 mL Intravenous Q12H   traZODone   100 mg Oral QHS   Continuous Infusions:  sodium chloride      PRN Meds: sodium chloride , acetaminophen , albuterol , HYDROcodone -acetaminophen , ondansetron  (ZOFRAN ) IV, sodium chloride  flush   Vital Signs  Vitals:   03/07/24 0700 03/07/24 0719 03/07/24 0800 03/07/24 0823  BP:  105/78    Pulse: 84 79 78   Resp: 17 20 (!)  22   Temp:  97.6 F (36.4 C)    TempSrc:  Oral    SpO2: 93% 97% 91% 93%    Intake/Output Summary (Last 24 hours) at 03/07/2024 1017 Last data filed at 03/07/2024 9060 Gross per 24 hour  Intake 983 ml  Output 1200 ml  Net -217 ml      12/08/2023   11:27 AM 10/08/2023    9:24 AM 10/07/2023   12:17 PM  Last 3 Weights  Weight (lbs) 145 lb 3.2 oz 146 lb 153 lb  Weight (kg) 65.862 kg 66.225 kg 69.4 kg      Telemetry Sinus rhythm- Personally Reviewed  ECG  No new EKG- Personally  Reviewed  Physical Exam  GEN: No acute distress.  Resting in bed Neck: No JVD Cardiac: RRR, no murmurs, rubs, or gallops.  Respiratory: Clear to auscultation bilaterally. GI: Soft, nontender, non-distended  MS: No edema; No deformity. Neuro:  Nonfocal  Psych: Normal affect   Labs High Sensitivity Troponin:  No results for input(s): TROPONINIHS in the last 720 hours. No results for input(s): TRNPT in the last 720 hours.     ChemistryNo results for input(s): NA, K, CL, CO2, GLUCOSE, BUN, CREATININE, CALCIUM , MG, PROT, ALBUMIN, AST, ALT, ALKPHOS, BILITOT, GFRNONAA, GFRAA, ANIONGAP in the last 168 hours.  Lipids No results for input(s): CHOL, TRIG, HDL, LABVLDL, LDLCALC, CHOLHDL in the last 168 hours.  HematologyNo results for input(s): WBC, RBC, HGB, HCT, MCV, MCH, MCHC, RDW, PLT in the last 168 hours. Thyroid  No results for input(s): TSH, FREET4 in the last 168 hours.  BNPNo results for input(s): BNP, PROBNP in the last 168 hours.  DDimer No results for input(s): DDIMER in the last 168 hours.   Radiology => images personally reviewed. CARDIAC CATHETERIZATION Result Date: 03/06/2024   Prox RCA lesion is 60% stenosed.   Mid RCA lesion is 100% stenosed.   Dist RCA lesion is 100% stenosed.   2nd Diag lesion is 50% stenosed.   2nd Mrg lesion is 50% stenosed.   Non-stenotic Mid LAD lesion was previously treated.   The left ventricular systolic function is normal.   LV end diastolic pressure is normal.   The left ventricular ejection fraction is 55-65% by visual estimate. Single vessel occlusive CAD with CTO of the mid RCA. Good left to right and right to right collaterals. Continue excellent patency of the LAD stent. No new disease in the LCA Normal LV function Normal LVEDP. Recommendation: continue medical therapy.  Dominance: Right     Medical Readiness Date: 03/07/2024    For questions or updates, please contact  [facility] Please consult [email or web address] for contact info under       Signed, Alm Clay, MD  03/07/2024, 10:17 AM    "

## 2024-03-07 NOTE — Care Management (Signed)
 SDOH resources added to AVS

## 2024-03-09 ENCOUNTER — Other Ambulatory Visit: Payer: Self-pay

## 2024-03-09 DIAGNOSIS — E785 Hyperlipidemia, unspecified: Secondary | ICD-10-CM | POA: Insufficient documentation

## 2024-03-09 DIAGNOSIS — I10 Essential (primary) hypertension: Secondary | ICD-10-CM | POA: Insufficient documentation

## 2024-03-10 NOTE — Telephone Encounter (Signed)
 Patient contacted regarding discharge from [facility] on 03/07/24.  Patient understands to follow up with provider Dr. Edwyna on 03/16/24 at 10:20 at [facility] office. Patient understands discharge instructions? Patient states that she was not given any discharge instructions and instead was told to print them off of MyChart. Patient understands medications and regiment? yes Patient understands to bring all medications to this visit? yes

## 2024-03-16 ENCOUNTER — Ambulatory Visit: Admitting: Cardiology
